# Patient Record
Sex: Female | Born: 1960 | Race: White | Hispanic: No | Marital: Married | Smoking: Former smoker
Health system: Southern US, Community
[De-identification: ages and names within clinical notes are randomized; demographics above are authoritative.]

## PROBLEM LIST (undated history)

## (undated) DIAGNOSIS — E559 Vitamin D deficiency, unspecified: Secondary | ICD-10-CM

## (undated) DIAGNOSIS — L309 Dermatitis, unspecified: Secondary | ICD-10-CM

## (undated) DIAGNOSIS — I8390 Asymptomatic varicose veins of unspecified lower extremity: Secondary | ICD-10-CM

## (undated) DIAGNOSIS — N200 Calculus of kidney: Secondary | ICD-10-CM

## (undated) DIAGNOSIS — I10 Essential (primary) hypertension: Secondary | ICD-10-CM

## (undated) DIAGNOSIS — R3915 Urgency of urination: Secondary | ICD-10-CM

## (undated) DIAGNOSIS — M3500 Sicca syndrome, unspecified: Secondary | ICD-10-CM

## (undated) DIAGNOSIS — T4145XA Adverse effect of unspecified anesthetic, initial encounter: Secondary | ICD-10-CM

## (undated) DIAGNOSIS — Z8639 Personal history of other endocrine, nutritional and metabolic disease: Secondary | ICD-10-CM

## (undated) DIAGNOSIS — R350 Frequency of micturition: Secondary | ICD-10-CM

## (undated) DIAGNOSIS — K5730 Diverticulosis of large intestine without perforation or abscess without bleeding: Secondary | ICD-10-CM

## (undated) DIAGNOSIS — T8859XA Other complications of anesthesia, initial encounter: Secondary | ICD-10-CM

## (undated) DIAGNOSIS — R7303 Prediabetes: Secondary | ICD-10-CM

## (undated) DIAGNOSIS — G4730 Sleep apnea, unspecified: Secondary | ICD-10-CM

## (undated) DIAGNOSIS — E782 Mixed hyperlipidemia: Secondary | ICD-10-CM

## (undated) DIAGNOSIS — Z87442 Personal history of urinary calculi: Secondary | ICD-10-CM

## (undated) HISTORY — PX: FOOT SURGERY: SHX648

## (undated) HISTORY — DX: Essential (primary) hypertension: I10

## (undated) HISTORY — DX: Sleep apnea, unspecified: G47.30

## (undated) HISTORY — PX: OTHER SURGICAL HISTORY: SHX169

## (undated) HISTORY — PX: COLONOSCOPY: SHX174

---

## 1998-04-14 ENCOUNTER — Other Ambulatory Visit: Admission: RE | Admit: 1998-04-14 | Discharge: 1998-04-14 | Payer: Self-pay | Admitting: Gynecology

## 1999-04-13 ENCOUNTER — Other Ambulatory Visit: Admission: RE | Admit: 1999-04-13 | Discharge: 1999-04-13 | Payer: Self-pay | Admitting: Gynecology

## 2000-04-17 ENCOUNTER — Other Ambulatory Visit: Admission: RE | Admit: 2000-04-17 | Discharge: 2000-04-17 | Payer: Self-pay | Admitting: Gynecology

## 2001-04-17 ENCOUNTER — Other Ambulatory Visit: Admission: RE | Admit: 2001-04-17 | Discharge: 2001-04-17 | Payer: Self-pay | Admitting: Gynecology

## 2002-04-19 ENCOUNTER — Other Ambulatory Visit: Admission: RE | Admit: 2002-04-19 | Discharge: 2002-04-19 | Payer: Self-pay | Admitting: Gynecology

## 2002-10-18 ENCOUNTER — Other Ambulatory Visit: Admission: RE | Admit: 2002-10-18 | Discharge: 2002-10-18 | Payer: Self-pay | Admitting: Gynecology

## 2003-02-11 ENCOUNTER — Other Ambulatory Visit: Admission: RE | Admit: 2003-02-11 | Discharge: 2003-02-11 | Payer: Self-pay | Admitting: Gynecology

## 2003-05-16 ENCOUNTER — Other Ambulatory Visit: Admission: RE | Admit: 2003-05-16 | Discharge: 2003-05-16 | Payer: Self-pay | Admitting: Gynecology

## 2003-09-19 ENCOUNTER — Other Ambulatory Visit: Admission: RE | Admit: 2003-09-19 | Discharge: 2003-09-19 | Payer: Self-pay | Admitting: Gynecology

## 2005-03-27 ENCOUNTER — Encounter: Admission: RE | Admit: 2005-03-27 | Discharge: 2005-03-27 | Payer: Self-pay | Admitting: Internal Medicine

## 2007-01-07 ENCOUNTER — Ambulatory Visit: Payer: Self-pay | Admitting: Gastroenterology

## 2007-01-07 LAB — CONVERTED CEMR LAB
Basophils Absolute: 0 10*3/uL (ref 0.0–0.1)
Eosinophils Absolute: 0.1 10*3/uL (ref 0.0–0.6)
Ferritin: 162.5 ng/mL (ref 10.0–291.0)
HCT: 39.7 % (ref 36.0–46.0)
Hemoglobin: 13.6 g/dL (ref 12.0–15.0)
Iron: 61 ug/dL (ref 42–145)
Lymphocytes Relative: 36.2 % (ref 12.0–46.0)
Monocytes Relative: 12 % — ABNORMAL HIGH (ref 3.0–11.0)
Platelets: 203 10*3/uL (ref 150–400)
RDW: 11.4 % — ABNORMAL LOW (ref 11.5–14.6)
Saturation Ratios: 18.7 % — ABNORMAL LOW (ref 20.0–50.0)
Transferrin: 233.2 mg/dL (ref >=212.0)

## 2007-02-17 ENCOUNTER — Ambulatory Visit: Payer: Self-pay | Admitting: Gastroenterology

## 2007-02-17 LAB — HM COLONOSCOPY

## 2007-04-14 ENCOUNTER — Ambulatory Visit: Payer: Self-pay | Admitting: Gastroenterology

## 2007-09-04 DIAGNOSIS — Z8719 Personal history of other diseases of the digestive system: Secondary | ICD-10-CM

## 2007-09-04 DIAGNOSIS — E039 Hypothyroidism, unspecified: Secondary | ICD-10-CM

## 2007-09-04 DIAGNOSIS — K644 Residual hemorrhoidal skin tags: Secondary | ICD-10-CM

## 2007-09-04 DIAGNOSIS — K648 Other hemorrhoids: Secondary | ICD-10-CM | POA: Insufficient documentation

## 2010-11-13 NOTE — Assessment & Plan Note (Signed)
[facility]                         GASTROENTEROLOGY OFFICE NOTE   Tiffany Glover, Tiffany Glover                       MRN:          [id number]  DATE:04/14/2007                            DOB:          04-29-1961    PRIMARY CARE PHYSICIAN:  Tiffany Glover, M.D.   GI PROBLEM LIST:  Chronic alternating constipation and loose stools.  Status post colonoscopy in August of 2008.  Normal examination to the  terminal ileum other than left-sided diverticulosis.  Small internal and  external hemorrhoids.   INTERVAL HISTORY:  I last saw the patient at the time of her  colonoscopy.  At that time it seemed that fiber supplements were not  really helping her and so she began on MiraLax that she has been taking  2-4 times a week on a rather p.r.n. basis.  She has continued to take a  lot of fiber in her diet.  She does take AnaMantle for anal itching and  says that definitely helps.  She probably takes that once or twice a  week.  She still has to strain to move her bowels at least several times  a week and has rather scybalous small stools.   CURRENT MEDICATIONS:  1. Repliva.  2. Armor thyroid.  3. Multivitamin.  4. Vitamin D.  5. Magnesium.   PHYSICAL EXAMINATION:  VITAL SIGNS:  Weight 220 pounds which is up 9  pounds since her last visit three months ago, blood pressure 104/70,  pulse 64.  CONSTITUTIONAL:  Obese, otherwise well appearing.  NEUROLOGY:  Alert and oriented x3.  ABDOMEN:  Soft, nontender, and nondistended.  Normal bowel sounds.   ASSESSMENT:  A 50 year old woman with intermittent constipation and  loose stools, likely functional.  MiraLax is usually best taken on a  daily basis rather than p.r.n. and so she will begin taking one scoop  everyday for the next 10 days.  If she does not notice much improvement  at that point she will double this.  She will return to see me in two  months or sooner if needed.     Tiffany Fee, MD  Electronically Signed    DPJ/MedQ  DD: 04/14/2007  DT: 04/15/2007  Job #: [id number]   cc:   Tiffany Glover

## 2010-11-13 NOTE — Assessment & Plan Note (Signed)
[facility]                         GASTROENTEROLOGY OFFICE NOTE   Tiffany Glover, Tiffany Glover                       MRN:          [id number]  DATE:01/07/2007                            DOB:          [birth year]    REFERRING PHYSICIAN:  Allena Napoleon   REASON FOR REFERRAL:  Dr. Alessandra Bevels asked me to evaluate Tiffany Glover in  consultation, regarding heme-positive stool, chronic constipation, left  lower quadrant pain.   HISTORY OF PRESENT ILLNESS:  Tiffany Glover is a very pleasant 50 year old  woman, who has had intermittent constipation and loose stools for many  years.  She says usually, if she is very constipated, she will drink a  lot of extra water and it does seem to help.  She has never tried fiber  supplementations.  She does see intermittent bright red blood per  rectum.  Usually, this is around the time of a lot of straining to move  her bowels with constipation.  She was recently heme-positive on two out  of four samples by Dr. Alessandra Bevels.  She was told she had low iron levels and  was put on Repliva approximately one month ago.  Since then, her stools  have been black and she thinks her constipation is a bit worse.  We do  not have the iron testing results here.  Lastly, she has intermittent  left lower quadrant pains.  These last for one to two days.  This  happens approximately once a month and has been going on for at least a  year.  At first, she thought this was gynecologic, but she has been  evaluated for this by a gynecologist, and they do not believe her pains  are gynecologic-related.  She is not clear on whether moving her bowels  will help these pains.   REVIEW OF SYSTEMS:  Notable for recent dark stools since beginning iron  supplements and a 37-pound intentional weight-loss in six months.  The  rest of her review of systems is essentially normal and is available on  her nursing intake sheet.   PAST MEDICAL HISTORY:  Elevated cholesterol,  hypothyroid, seasonal  allergies.   CURRENT MEDICINES:  1. Repliva.  2. Thyroid medicine.  3. Multivitamin.  4. Vitamin D.  5. Magnesium.   ALLERGIES:  No known drug allergies.   SOCIAL HISTORY:  Married with two sons.  Works as a Clinical cytogeneticist, nonsmoker, rarely drinks alcohol.   FAMILY HISTORY:  Alcoholism runs in her family.  No colon cancer or  colon polyps in her family.   PHYSICAL EXAM:  She is 5 feet 4 inches, 211 pounds.  Blood pressure  104/78, pulse 80.  CONSTITUTIONAL:  Generally well-appearing.  NEUROLOGIC:  Alert and oriented times three.  EYES:  Extraocular movements intact.  MOUTH:  Oropharynx moist, no lesions.  NECK:  Supple, no lymphadenopathy.  CARDIOVASCULAR/HEART:  Regular rate and rhythm.  LUNGS:  Clear to auscultation bilaterally.  ABDOMEN:  Soft, nontender, nondistended.  Normal bowel sounds.  EXTREMITIES:  No lower extremity edema.  SKIN:  No rashes or lesions on visible extremities.  ASSESSMENT AND PLAN:  Patient is a 50 year old woman with heme-positive  stool, mild intermittent constipation, intermittent bright red blood per  rectum.   First, she does have intermittent bright red blood per rectum and heme-  positive stool.  This seems like very minor bleeding.  I doubt she is  anemic, as she does not appear to be so clinically, but I will arrange  for her to have a CBC done today.  For her constipation, I have great  success with basic fiber supplementation.  I have recommended she begin  Citrucel, tapering slowly up to at least one scoop a day to see if that  helps.  That may also help her chronic intermittent left lower quadrant  pains, if they are related to her constipation.  We will arrange for her  to have a colonoscopy performed in three to four weeks.  I would like to  have her be on fiber supplements for a bit, so I can get an update on  her symptoms at the time of her colonoscopy.     Rachael Fee, MD   Electronically Signed    DPJ/MedQ  DD: 01/07/2007  DT: 01/07/2007  Job #: [id number]   cc:   Allena Napoleon

## 2011-12-09 ENCOUNTER — Ambulatory Visit (INDEPENDENT_AMBULATORY_CARE_PROVIDER_SITE_OTHER): Payer: BC Managed Care – PPO | Admitting: Emergency Medicine

## 2011-12-09 VITALS — BP 127/76 | HR 69 | Temp 97.7°F | Resp 16 | Ht 64.0 in | Wt 260.6 lb

## 2011-12-09 DIAGNOSIS — Z111 Encounter for screening for respiratory tuberculosis: Secondary | ICD-10-CM

## 2011-12-09 NOTE — Progress Notes (Signed)
  Subjective:    Patient ID: Tiffany Glover, female    DOB: September 05, 1960, 51 y.o.   MRN: [id number]  HPI patient is to have a PPD. 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    Review of Systems     Objective:   Physical Exam HEENT exam is unremarkable. Neck is supple. Chest is clear.        Assessment & Plan:  Patient to receive a PPD for work

## 2011-12-11 ENCOUNTER — Encounter (INDEPENDENT_AMBULATORY_CARE_PROVIDER_SITE_OTHER): Payer: BC Managed Care – PPO

## 2011-12-11 DIAGNOSIS — Z111 Encounter for screening for respiratory tuberculosis: Secondary | ICD-10-CM

## 2011-12-11 LAB — TB SKIN TEST: TB Skin Test: NEGATIVE

## 2012-02-21 ENCOUNTER — Emergency Department (HOSPITAL_COMMUNITY): Payer: BC Managed Care – PPO

## 2012-02-21 ENCOUNTER — Encounter (HOSPITAL_COMMUNITY): Payer: Self-pay | Admitting: Emergency Medicine

## 2012-02-21 ENCOUNTER — Emergency Department (HOSPITAL_COMMUNITY)
Admission: EM | Admit: 2012-02-21 | Discharge: 2012-02-21 | Disposition: A | Discharge status: Home Health | Payer: BC Managed Care – PPO | Attending: Emergency Medicine | Admitting: Emergency Medicine

## 2012-02-21 DIAGNOSIS — N201 Calculus of ureter: Secondary | ICD-10-CM

## 2012-02-21 DIAGNOSIS — R197 Diarrhea, unspecified: Secondary | ICD-10-CM | POA: Insufficient documentation

## 2012-02-21 DIAGNOSIS — R1013 Epigastric pain: Secondary | ICD-10-CM | POA: Insufficient documentation

## 2012-02-21 LAB — COMPREHENSIVE METABOLIC PANEL
AST: 15 U/L (ref 0–37)
Albumin: 3.9 g/dL (ref 3.5–5.2)
Chloride: 105 mEq/L (ref 96–112)
Creatinine, Ser: 0.71 mg/dL (ref 0.50–1.10)
GFR calc Af Amer: >90 mL/min (ref >90)
GFR calc non Af Amer: >90 mL/min (ref >90)
Sodium: 141 mEq/L (ref 135–145)
Total Protein: 7.8 g/dL (ref 6.0–8.3)

## 2012-02-21 LAB — URINALYSIS, ROUTINE W REFLEX MICROSCOPIC
Hgb urine dipstick: NEGATIVE
Ketones, ur: NEGATIVE mg/dL
Nitrite: NEGATIVE
Protein, ur: 30 mg/dL — AB
Urobilinogen, UA: 0.2 mg/dL (ref 0.0–1.0)
pH: 5.5 (ref 5.0–8.0)

## 2012-02-21 LAB — CBC WITH DIFFERENTIAL/PLATELET
Basophils Absolute: 0 10*3/uL (ref 0.0–0.1)
Eosinophils Relative: 3 % (ref 0–5)
Hemoglobin: 14.3 g/dL (ref 12.0–15.0)
Lymphocytes Relative: 33 % (ref 12–46)
MCV: 86.1 fL (ref 78.0–100.0)
Monocytes Absolute: 0.6 10*3/uL (ref 0.1–1.0)
Monocytes Relative: 9 % (ref 3–12)
RBC: 4.83 MIL/uL (ref 3.87–5.11)
WBC: 6.5 10*3/uL (ref 4.0–10.5)

## 2012-02-21 LAB — URINE MICROSCOPIC-ADD ON

## 2012-02-21 LAB — PREGNANCY, URINE: Preg Test, Ur: NEGATIVE

## 2012-02-21 LAB — LIPASE, BLOOD: Lipase: 41 U/L (ref 11–59)

## 2012-02-21 MED ORDER — ONDANSETRON HCL 4 MG/2ML IJ SOLN
INTRAMUSCULAR | Status: AC
  Filled 2012-02-21: qty 2

## 2012-02-21 MED ORDER — OXYCODONE-ACETAMINOPHEN 5-325 MG PO TABS: 1.0000 | ORAL_TABLET | ORAL | Status: AC | PRN

## 2012-02-21 MED ORDER — TAMSULOSIN HCL 0.4 MG PO CAPS: ORAL_CAPSULE | ORAL | Status: DC

## 2012-02-21 MED ORDER — HYDROMORPHONE HCL PF 2 MG/ML IJ SOLN
2.0000 mg | Freq: Once | INTRAMUSCULAR | Status: AC
  Administered 2012-02-21: 2 mg via INTRAVENOUS
  Filled 2012-02-21: qty 1

## 2012-02-21 MED ORDER — HYDROMORPHONE HCL PF 2 MG/ML IJ SOLN
2.0000 mg | Freq: Once | INTRAMUSCULAR | Status: AC
  Administered 2012-02-21: 2 mg via INTRAVENOUS

## 2012-02-21 MED ORDER — HYDROMORPHONE HCL PF 2 MG/ML IJ SOLN
INTRAMUSCULAR | Status: AC
  Filled 2012-02-21: qty 1

## 2012-02-21 MED ORDER — OXYCODONE-ACETAMINOPHEN 5-325 MG PO TABS
1.0000 | ORAL_TABLET | Freq: Once | ORAL | Status: AC
  Administered 2012-02-21: 1 via ORAL
  Filled 2012-02-21: qty 1

## 2012-02-21 MED ORDER — ONDANSETRON HCL 4 MG/2ML IJ SOLN
4.0000 mg | Freq: Once | INTRAMUSCULAR | Status: AC
  Administered 2012-02-21: 4 mg via INTRAVENOUS

## 2012-02-21 NOTE — ED Notes (Addendum)
Patient complaining of right flank pain/side pain that started this morning.  Patient denies any changes in urine sample.  Patient reports nausea; denies vomiting -- patient reports dry heaving.  Denies diarrhea.  LMP end of July.

## 2012-02-21 NOTE — ED Provider Notes (Signed)
History     CSN: [id number]  Arrival date & time 02/21/12  0703   First MD Initiated Contact with Patient 02/21/12 [id number]      Chief Complaint  Patient presents with  . Flank Pain    (Consider location/radiation/quality/duration/timing/severity/associated sxs/prior treatment) HPI Comments: The patient is a 51 year old man who pain or right flank fibrillation is starting. The pain is so bad she has to stand and painful. She's had problems with epigastric pain in the past and takes Prilosec for that. She has a son who has had kidney stones. Review of prior charts shows a history of constipation. In the past she's been on thyroid medication. She's had 2 prior C-sections.  Patient is a 51 y.o. female presenting with flank pain. The history is provided by the patient and medical records. No language interpreter was used.  Flank Pain This is a new problem. The current episode started 1 to 2 hours ago. The problem occurs constantly. The problem has not changed since onset.Associated symptoms include abdominal pain. Pertinent negatives include no chest pain, no headaches and no shortness of breath. Nothing aggravates the symptoms. Nothing relieves the symptoms. She has tried nothing for the symptoms.    History reviewed. No pertinent past medical history.  Past Surgical History  Procedure Date  . Cesarean section     History reviewed. No pertinent family history.  History  Substance Use Topics  . Smoking status: Never Smoker   . Smokeless tobacco: Not on file  . Alcohol Use: Yes     Occassional Use    OB History    Grav Para Term Preterm Abortions TAB SAB Ect Mult Living                  Review of Systems  Constitutional: Negative for fever and chills.  HENT: Negative.   Eyes: Negative.   Respiratory: Negative.  Negative for shortness of breath.   Cardiovascular: Negative.  Negative for chest pain.  Gastrointestinal: Positive for abdominal pain and diarrhea. Negative for  nausea and vomiting.  Genitourinary: Positive for flank pain.  Musculoskeletal: Negative.   Skin: Negative.   Neurological: Negative.  Negative for headaches.  Psychiatric/Behavioral: Negative.     Allergies  Review of patient's allergies indicates no known allergies.  Home Medications   Current Outpatient Rx  Name Route Sig Dispense Refill  . ONE-DAILY MULTI VITAMINS PO TABS Oral Take 1 tablet by mouth daily.      BP 201/94  Pulse 89  Temp 98.6 F (37 C) (Oral)  Resp 20  SpO2 99%  LMP 01/29/2012  Physical Exam  Nursing note and vitals reviewed. Constitutional: She is oriented to person, place, and time.       Morbidly obese woman in acute distress with right flank pain.  HENT:  Head: Normocephalic and atraumatic.  Right Ear: External ear normal.  Left Ear: External ear normal.  Mouth/Throat: Oropharynx is clear and moist.  Eyes: Conjunctivae and EOM are normal. Pupils are equal, round, and reactive to light.  Neck: Neck supple.  Cardiovascular: Normal rate, regular rhythm and normal heart sounds.   Pulmonary/Chest: Effort normal and breath sounds normal.  Abdominal: Soft. She exhibits no distension and no mass. There is no tenderness.       She localizes pain to the right costovertebral angle region and into the right flank. There is no palpable deformity or point tenderness over this area.  Musculoskeletal: Normal range of motion.  Neurological: She is alert and oriented  to person, place, and time.       No sensory or motor deficit.  Skin: Skin is warm and dry.  Psychiatric: She has a normal mood and affect. Her behavior is normal.    ED Course  Procedures (including critical care time)   Labs Reviewed  CBC WITH DIFFERENTIAL  COMPREHENSIVE METABOLIC PANEL  URINALYSIS, ROUTINE W REFLEX MICROSCOPIC  LIPASE, BLOOD  PREGNANCY, URINE   7:26 AM Pt seen --> physical exam performed.  Lab workup ordered.  IV Dilaudid and Zofran ordered.  9:19 AM Results for  orders placed during the hospital encounter of 02/21/12  CBC WITH DIFFERENTIAL      Component Value Range   WBC 6.5  4.0 - 10.5 K/uL   RBC 4.83  3.87 - 5.11 MIL/uL   Hemoglobin 14.3  12.0 - 15.0 g/dL   HCT 16.1  09.6 - 04.5 %   MCV 86.1  78.0 - 100.0 fL   MCH 29.6  26.0 - 34.0 pg   MCHC 34.4  30.0 - 36.0 g/dL   RDW 40.9  81.1 - 91.4 %   Platelets 187  150 - 400 K/uL   Neutrophils Relative 55  43 - 77 %   Neutro Abs 3.6  1.7 - 7.7 K/uL   Lymphocytes Relative 33  12 - 46 %   Lymphs Abs 2.1  0.7 - 4.0 K/uL   Monocytes Relative 9  3 - 12 %   Monocytes Absolute 0.6  0.1 - 1.0 K/uL   Eosinophils Relative 3  0 - 5 %   Eosinophils Absolute 0.2  0.0 - 0.7 K/uL   Basophils Relative 1  0 - 1 %   Basophils Absolute 0.0  0.0 - 0.1 K/uL  COMPREHENSIVE METABOLIC PANEL      Component Value Range   Sodium 141  135 - 145 mEq/L   Potassium 4.0  3.5 - 5.1 mEq/L   Chloride 105  96 - 112 mEq/L   CO2 24  19 - 32 mEq/L   Glucose, Bld 129 (*) 70 - 99 mg/dL   BUN 16  6 - 23 mg/dL   Creatinine, Ser 7.82  0.50 - 1.10 mg/dL   Calcium 9.2  8.4 - 95.6 mg/dL   Total Protein 7.8  6.0 - 8.3 g/dL   Albumin 3.9  3.5 - 5.2 g/dL   AST 15  0 - 37 U/L   ALT 16  0 - 35 U/L   Alkaline Phosphatase 60  39 - 117 U/L   Total Bilirubin 0.4  0.3 - 1.2 mg/dL   GFR calc non Af Amer >90  >90 mL/min   GFR calc Af Amer >90  >90 mL/min  URINALYSIS, ROUTINE W REFLEX MICROSCOPIC      Component Value Range   Color, Urine YELLOW  YELLOW   APPearance HAZY (*) CLEAR   Specific Gravity, Urine 1.028  1.005 - 1.030   pH 5.5  5.0 - 8.0   Glucose, UA NEGATIVE  NEGATIVE mg/dL   Hgb urine dipstick NEGATIVE  NEGATIVE   Bilirubin Urine NEGATIVE  NEGATIVE   Ketones, ur NEGATIVE  NEGATIVE mg/dL   Protein, ur 30 (*) NEGATIVE mg/dL   Urobilinogen, UA 0.2  0.0 - 1.0 mg/dL   Nitrite NEGATIVE  NEGATIVE   Leukocytes, UA NEGATIVE  NEGATIVE  LIPASE, BLOOD      Component Value Range   Lipase 41  11 - 59 U/L  PREGNANCY, URINE       Component Value  Range   Preg Test, Ur NEGATIVE  NEGATIVE  POCT PREGNANCY, URINE      Component Value Range   Preg Test, Ur NEGATIVE  NEGATIVE  URINE MICROSCOPIC-ADD ON      Component Value Range   Squamous Epithelial / LPF MANY (*) RARE   WBC, UA 3-6  <3 WBC/hpf   RBC / HPF 0-2  <3 RBC/hpf   Bacteria, UA MANY (*) RARE   Ct Abdomen Pelvis Wo Contrast  02/21/2012  *RADIOLOGY REPORT*  Clinical Data: Right flank and epigastric pain with nausea.  CT ABDOMEN AND PELVIS WITHOUT CONTRAST  Technique:  Multidetector CT imaging of the abdomen and pelvis was performed following the standard protocol without intravenous contrast.  Comparison: Pelvic ultrasound 03/27/2005.  Findings: The lung bases are clear.  There is no pleural effusion. No renal calculi are demonstrated.  There is mild right-sided hydronephrosis and hydroureter.  There is a 3 mm obstructing calculus at the right ureteral vesicle junction on image 76.  This is questionably visible on the patient's scout image.  No other urinary tract calculi are seen.  The kidneys otherwise appear normal.  The liver demonstrates diffuse low density consistent with steatosis.  There is relative sparing surrounding the gallbladder. No focal lesions are identified.  As evaluated in the noncontrast state, the spleen, adrenal glands, pancreas and gallbladder appear normal.  There is a small supraumbilical hernia containing only fat.  The bowel gas pattern is normal.  No inflammatory changes or enlarged lymph nodes are seen. The uterus and ovaries appear unremarkable.  There is early aorto iliac atherosclerosis.  There is moderately advanced degenerative disc disease at L5-S1 with disc space loss and endplate sclerosis.  This results in mild biforaminal stenosis.  IMPRESSION:  1.  Obstructing 3 mm calculus at the right ureteral vesicle junction. 2.  No other urinary tract calculi. 3.  Hepatic steatosis, lumbar spondylosis and atherosclerosis as described.   Original  Report Authenticated By: Gerrianne Scale, M.D.     CT of abdomen/pelvis shows a 3 mm right ureteral stone at the right ureterovesical junction.  I discussed her findings with her and with her husband.  Will observe for an hour to be sure her pain is under control prior to discharge.   10:26 AM Pt resting comfortably.  Will discharge, with followup with Dr. Retta Diones, urologist on call.    1. Right ureteral calculus          Carleene Cooper III, MD 02/21/12 1029

## 2012-02-21 NOTE — ED Notes (Signed)
Family at bedside. 

## 2012-06-16 ENCOUNTER — Ambulatory Visit: Payer: BC Managed Care – PPO

## 2012-06-16 ENCOUNTER — Ambulatory Visit (INDEPENDENT_AMBULATORY_CARE_PROVIDER_SITE_OTHER): Payer: BC Managed Care – PPO | Admitting: Family Medicine

## 2012-06-16 VITALS — BP 140/90 | HR 82 | Temp 97.9°F | Resp 16 | Ht 64.0 in | Wt 262.0 lb

## 2012-06-16 DIAGNOSIS — H9209 Otalgia, unspecified ear: Secondary | ICD-10-CM

## 2012-06-16 DIAGNOSIS — R059 Cough, unspecified: Secondary | ICD-10-CM

## 2012-06-16 DIAGNOSIS — R0989 Other specified symptoms and signs involving the circulatory and respiratory systems: Secondary | ICD-10-CM

## 2012-06-16 DIAGNOSIS — R05 Cough: Secondary | ICD-10-CM

## 2012-06-16 DIAGNOSIS — R0982 Postnasal drip: Secondary | ICD-10-CM

## 2012-06-16 MED ORDER — IPRATROPIUM BROMIDE 0.03 % NA SOLN: 2.0000 | Freq: Four times a day (QID) | NASAL | Status: DC

## 2012-06-16 MED ORDER — CEFDINIR 300 MG PO CAPS: 300.0000 mg | ORAL_CAPSULE | Freq: Two times a day (BID) | ORAL | Status: DC

## 2012-06-16 MED ORDER — HYDROCODONE-HOMATROPINE 5-1.5 MG/5ML PO SYRP: 5.0000 mL | ORAL_SOLUTION | Freq: Three times a day (TID) | ORAL | Status: DC | PRN

## 2012-06-16 NOTE — Patient Instructions (Addendum)
Use the antiobiotic as directed, and the cough syrup as needed.  Be careful as the syrup can make you sleepy.  You can also use the nasal spray for drainage.  Let me know if you are not better in the next 2 or 3 days- Sooner if worse.

## 2012-06-16 NOTE — Progress Notes (Signed)
[facility] [address], [place] [address] [phone]  Date:  06/16/2012   Name:  Tiffany Glover   DOB:  Oct 07, 1960   MRN:  [id number]  PCP:  Pcp Not In System    Chief Complaint: Nasal Congestion, Cough, Laryngitis and Otalgia   History of Present Illness:  Tiffany Glover is a 51 y.o. very pleasant female patient who presents with the following: Here today with illness.  She was ill with sinus and left ear congestion about 3 weeks ago- treated with OTC medications and got better, but then her symptoms returned.  She now has sinus congestion/ pressure/ PND and a cough She has noted a cough for the last week.  The cough can be productive No fever, no chills or aches She has felt a bit short winded for the last couple of days, no CP.  This is not severe but she wanted to mention it.    Patient Active Problem List  Diagnosis  . HYPOTHYROIDISM  . INTERNAL HEMORRHOIDS  . EXTERNAL HEMORRHOIDS  . DIVERTICULOSIS OF COLON    History reviewed. No pertinent past medical history.  Past Surgical History  Procedure Date  . Cesarean section   . Cesarean section     History  Substance Use Topics  . Smoking status: Never Smoker   . Smokeless tobacco: Not on file  . Alcohol Use: Yes     Comment: Occassional Use    Family History  Problem Relation Age of Onset  . Urolithiasis Son     No Known Allergies  Medication list has been reviewed and updated.  Current Outpatient Prescriptions on File Prior to Visit  Medication Sig Dispense Refill  . Multiple Vitamin (MULTIVITAMIN) tablet Take 1 tablet by mouth daily.      Marland Kitchen omeprazole (PRILOSEC) 40 MG capsule Take 40 mg by mouth daily.      . Tamsulosin HCl (FLOMAX) 0.4 MG CAPS Take one capsule per day to facilitate stone passage.  5 capsule  0    Review of Systems: As per HPI- otherwise negative.   Physical Examination: Filed Vitals:   06/16/12 0912  BP: 140/90  Pulse: 82  Temp: 97.9 F (36.6 C)   Resp: 16   Filed Vitals:   06/16/12 0912  Height: 5\' 4"  (1.626 m)  Weight: 262 lb (118.842 kg)   Body mass index is 44.97 kg/(m^2). Ideal Body Weight: Weight in (lb) to have BMI = 25: 145.3   GEN: WDWN, NAD, Non-toxic, A & O x 3, obese HEENT: Atraumatic, Normocephalic. Neck supple. No masses, No LAD. Bilateral TM wnl, oropharynx normal.  PEERL,EOMI.   Ears and Nose: No external deformity. CV: RRR, No M/G/R. No JVD. No thrill. No extra heart sounds. PULM: CTA B, no wheezes or rhonchi. No retractions. No resp. distress. No accessory muscle use. Slight crackles right base ABD: S, NT, ND EXTR: No c/c/e NEURO Normal gait.  PSYCH: Normally interactive. Conversant. Not depressed or anxious appearing.  Calm demeanor.   [facility] reading (PRIMARY) by  Dr. Patsy Lager. CXR: degenerative change in the T spine.  Bronchitis changes  CHEST - 2 VIEW  Comparison: None.  Findings: There is minimal scarring in the right mid lung. Lungs otherwise clear. Heart size and pulmonary vascularity are normal. No adenopathy. There is degenerative change in the mid thoracic spine.  IMPRESSION: No edema or consolidation.  Assessment and Plan: 1. Chest congestion  DG Chest 2 View  2. Cough  cefdinir (OMNICEF)  300 MG capsule, HYDROcodone-homatropine (HYCODAN) 5-1.5 MG/5ML syrup  3. Ear pain    4. PND (post-nasal drip)  ipratropium (ATROVENT) 0.03 % nasal spray   Treat as above for bronchitis.  Patient (or parent if minor) instructed to return to clinic or call if not better in 2-3 day(s).   Abbe Amsterdam, MD

## 2012-07-03 ENCOUNTER — Ambulatory Visit (INDEPENDENT_AMBULATORY_CARE_PROVIDER_SITE_OTHER): Payer: BC Managed Care – PPO | Admitting: Family Medicine

## 2012-07-03 ENCOUNTER — Ambulatory Visit: Payer: BC Managed Care – PPO

## 2012-07-03 VITALS — BP 136/83 | HR 76 | Temp 97.8°F | Resp 16 | Ht 64.0 in | Wt 271.0 lb

## 2012-07-03 DIAGNOSIS — H6590 Unspecified nonsuppurative otitis media, unspecified ear: Secondary | ICD-10-CM

## 2012-07-03 DIAGNOSIS — J069 Acute upper respiratory infection, unspecified: Secondary | ICD-10-CM

## 2012-07-03 DIAGNOSIS — R05 Cough: Secondary | ICD-10-CM

## 2012-07-03 DIAGNOSIS — J3489 Other specified disorders of nose and nasal sinuses: Secondary | ICD-10-CM

## 2012-07-03 MED ORDER — HYDROCOD POLST-CHLORPHEN POLST 10-8 MG/5ML PO LQCR: 5.0000 mL | Freq: Two times a day (BID) | ORAL | Status: DC | PRN

## 2012-07-03 MED ORDER — FLUTICASONE PROPIONATE 50 MCG/ACT NA SUSP: 2.0000 | Freq: Every day | NASAL | Status: DC

## 2012-07-03 MED ORDER — PREDNISONE 20 MG PO TABS: ORAL_TABLET | ORAL | Status: DC

## 2012-07-03 NOTE — Progress Notes (Signed)
Patient ID: Tiffany Glover MRN: [id number], DOB: [birth year], 52 y.o. Date of Encounter: 07/03/2012, 5:58 PM  Primary Physician: Pcp Not In System  Chief Complaint:  Chief Complaint  Patient presents with  . Otalgia    left ear still painful  . Sinusitis    finished antibiotics, feels like illness coming back    HPI: 52 y.o. year old female presents with a 4 day history of nasal congestion, post nasal drip, left sided sinus pressure, and cough. Patient initially seen on 06/16/12 and diagnosed with bronchitis. She had a chest xray that revealed minimal scarring in the right mid lung otherwise no edema or consolidation. She was treated with Omnicef, Atrovent nasal spray, and Hycodan. At that time she had been off and on sick for about 3 weeks. With her treatment on 06/16/12 she did feel considerably better, but she is not sure that her symptoms fully resolved.   She now comes in with the above. Afebrile. No chills. She complains of increasing pain along the left maxillary and frontal sinus when she leans forward. Left otalgia and muffled hearing along the left ear. At baseline she has equal hearing bilaterally. She does feel like that is considerable post nasal drip that she will have to cough to clear her throat every so often. She does not feel like she has any chest congestion at this time. No wheezing, shortness of breath, or chest pain. No sore throat currently. No right sided symptoms. She was a prior tobacco smoker. Quit smoking in 1996. Smoked for 14 years and got up to 1.5 packs per day. There have been several sick contacts at work with similar issues.    History reviewed. No pertinent past medical history.   Home Meds: Prior to Admission medications   Medication Sig Start Date End Date Taking? Authorizing Provider  Multiple Vitamin (MULTIVITAMIN) tablet Take 1 tablet by mouth daily.   Yes Historical Provider, MD  omeprazole (PRILOSEC) 40 MG capsule Take 40 mg by mouth daily.    Yes Historical Provider, MD  ipratropium (ATROVENT) 0.03 % nasal spray Place 2 sprays into the nose 4 (four) times daily. 06/16/12  No Gwenlyn Found Copland, MD  Tamsulosin HCl (FLOMAX) 0.4 MG CAPS Take one capsule per day to facilitate stone passage. 02/21/12  No Carleene Cooper III, MD    Allergies: No Known Allergies  History   Social History  . Marital Status: Married    Spouse Name: N/A    Number of Children: N/A  . Years of Education: N/A   Occupational History  . Not on file.   Social History Main Topics  . Smoking status: Never Smoker   . Smokeless tobacco: Not on file  . Alcohol Use: Yes     Comment: Occassional Use  . Drug Use: No  . Sexually Active:    Other Topics Concern  . Not on file   Social History Narrative  . No narrative on file     Review of Systems: Constitutional: positive for fatigue. negative for chills, fever, night sweats or weight changes HEENT: see above Cardiovascular: negative for chest pain or palpitations Respiratory: positive for coughnegative for hemoptysis, wheezing, or shortness of breath Abdominal: negative for abdominal pain, nausea, vomiting or diarrhea Dermatological: negative for rash Neurologic: negative for headache   Physical Exam: Blood pressure 136/83, pulse 76, temperature 97.8 F (36.6 C), temperature source Oral, resp. rate 16, height 5\' 4"  (1.626 m), weight 271 lb (122.925 kg), last menstrual period 05/28/2012,  SpO2 99.00%., Body mass index is 46.52 kg/(m^2). General: Well developed, well nourished, in no acute distress. Head: Normocephalic, atraumatic, eyes without discharge, sclera non-icteric, nares are congested. Bilateral auditory canals clear, TM's are without perforation, pearly grey with reflective cone of light bilaterally. Serous effusion behind left TM. Left sided pinna TTP. No mastoid TTP bilaterally. Left sided maxillary and frontal sinus TTP. Oral cavity moist, dentition normal. Posterior pharynx with post nasal  drip and mild erythema. No peritonsillar abscess or tonsillar exudate. Uvula midline.  Neck: Supple. No thyromegaly. Full ROM. No lymphadenopathy. Lungs: Clear bilaterally to auscultation without wheezes, rales, or rhonchi. Breathing is unlabored.  Heart: RRR with S1 S2. No murmurs, rubs, or gallops appreciated. Msk:  Strength and tone normal for age. Extremities: No clubbing or cyanosis. No edema. Neuro: Alert and oriented X 3. Moves all extremities spontaneously. CNII-XII grossly in tact. Psych:  Responds to questions appropriately with a normal affect.   Studies: Reviewed prior note and xray.   Sinus film: [facility] reading (PRIMARY) by  Dr. Katrinka Blazing. Negative  ASSESSMENT AND PLAN:  52 y.o. year old female with URI and serous otitis media. -Prednisone 20 mg #18 3x3, 2x3, 1x3 no RF -Flonase 2 sprays each nare daily #1 no RF -Tussionex 1 tsp bid prn cough #90 mL no RF -She has Atrovent nasal spray at home for use -Mucinex -Tylenol/Motrin prn -Rest/fluids -RTC precautions -RTC 3-5 days if no improvement  Elinor Dodge, PA-C 07/03/2012 5:58 PM

## 2012-07-09 NOTE — Progress Notes (Signed)
History and physical exam discussed in detail with Eula Listen, PA-C.  Sinus films reviewed and negative.  Agree with assessment and plan. KMS

## 2012-07-17 ENCOUNTER — Ambulatory Visit: Payer: BC Managed Care – PPO

## 2012-07-17 ENCOUNTER — Ambulatory Visit (INDEPENDENT_AMBULATORY_CARE_PROVIDER_SITE_OTHER): Payer: BC Managed Care – PPO | Admitting: Family Medicine

## 2012-07-17 VITALS — BP 140/84 | HR 103 | Temp 98.2°F | Resp 18 | Ht 65.0 in | Wt 260.4 lb

## 2012-07-17 DIAGNOSIS — R109 Unspecified abdominal pain: Secondary | ICD-10-CM

## 2012-07-17 DIAGNOSIS — R319 Hematuria, unspecified: Secondary | ICD-10-CM

## 2012-07-17 DIAGNOSIS — S40869A Insect bite (nonvenomous) of unspecified upper arm, initial encounter: Secondary | ICD-10-CM

## 2012-07-17 DIAGNOSIS — M549 Dorsalgia, unspecified: Secondary | ICD-10-CM

## 2012-07-17 DIAGNOSIS — S40269A Insect bite (nonvenomous) of unspecified shoulder, initial encounter: Secondary | ICD-10-CM

## 2012-07-17 DIAGNOSIS — Z87442 Personal history of urinary calculi: Secondary | ICD-10-CM

## 2012-07-17 LAB — POCT CBC
Granulocyte percent: 69.8 %G (ref 37–80)
HCT, POC: 50.3 % — AB (ref 37.7–47.9)
Hemoglobin: 15.6 g/dL (ref 12.2–16.2)
Lymph, poc: 2.1 (ref 0.6–3.4)
MCHC: 31 g/dL — AB (ref 31.8–35.4)
MPV: 10.7 fL (ref 0–99.8)
POC Granulocyte: 6.2 (ref 2–6.9)
POC MID %: 6.5 %M (ref 0–12)
RBC: 5.48 M/uL (ref 4.04–5.48)

## 2012-07-17 LAB — POCT URINALYSIS DIPSTICK
Bilirubin, UA: NEGATIVE
Glucose, UA: NEGATIVE
Ketones, UA: 40
Leukocytes, UA: NEGATIVE
Nitrite, UA: NEGATIVE
Spec Grav, UA: 1.025
Urobilinogen, UA: 0.2
pH, UA: 5.5

## 2012-07-17 LAB — POCT UA - MICROSCOPIC ONLY
Amorphous: POSITIVE
Casts, Ur, LPF, POC: NEGATIVE
Crystals, Ur, HPF, POC: NEGATIVE
Yeast, UA: NEGATIVE

## 2012-07-17 MED ORDER — DOXYCYCLINE HYCLATE 100 MG PO TABS: 100.0000 mg | ORAL_TABLET | Freq: Two times a day (BID) | ORAL | Status: DC

## 2012-07-17 MED ORDER — TAMSULOSIN HCL 0.4 MG PO CAPS: ORAL_CAPSULE | ORAL | Status: DC

## 2012-07-17 MED ORDER — HYDROCODONE-ACETAMINOPHEN 5-325 MG PO TABS: 1.0000 | ORAL_TABLET | Freq: Four times a day (QID) | ORAL | Status: DC | PRN

## 2012-07-17 NOTE — Progress Notes (Signed)
Subjective:    Patient ID: Tiffany Glover, female    DOB: [birth year], 52 y.o.   MRN: [id number]  HPI Tiffany Glover is a 52 y.o. female  Upper back pain - R flank pain started 3 days ago - sore, but different than muscle pain.  Shooting type pain when sitting.  Improved some next 2 mornings, then worse as day goes on. No fever. Sensation of urgency. No blood or burning.  Hx of nephrolithiasis in 02/13/12- passed on own. this was 1st episode, no follow up with urology. Son has had kidney stones. Slight nausea - no vomiting. Less appetite today.   Tick exposure - pulled off tick under R arm - last night.  Unknown how long was there due to location. No recent travel, no camping or working in yard. Removed with tweezers, then scraped  No fever, slight soreness in abdomen - middle aspect. Thought due to not eating much yesterday - trying to lose weight. Loose stools today.   Review of Systems  Constitutional: Negative for fever and chills.  Gastrointestinal: Positive for nausea and diarrhea.  Musculoskeletal: Positive for back pain.  Skin: Positive for rash.       Objective:   Physical Exam  Vitals reviewed. Constitutional: She is oriented to person, place, and time. She appears well-developed and well-nourished.  HENT:  Head: Normocephalic and atraumatic.  Pulmonary/Chest: Effort normal.  Abdominal: Soft. Normal appearance. She exhibits no distension. There is no tenderness. There is no rebound, no guarding and no CVA tenderness.       Nontender, nondistended, negative heel tap.   Neurological: She is alert and oriented to person, place, and time.  Skin: Skin is warm. There is erythema.     Psychiatric: She has a normal mood and affect. Her behavior is normal.   [facility] reading (PRIMARY) by  Dr. Neva Seat: 1 view abdomen  No nephrolith identified. .   Results for orders placed in visit on 07/17/12  POCT UA - MICROSCOPIC ONLY      Component Value Range   WBC, Ur, HPF, POC 0-2     RBC, urine, microscopic 0-6     Bacteria, U Microscopic trace     Mucus, UA trace     Epithelial cells, urine per micros 1-4     Crystals, Ur, HPF, POC neg     Casts, Ur, LPF, POC neg     Yeast, UA neg     Amorphous pos    POCT URINALYSIS DIPSTICK      Component Value Range   Color, UA yellow     Clarity, UA turbin     Glucose, UA neg     Bilirubin, UA neg     Ketones, UA 40     Spec Grav, UA 1.025     Blood, UA trace-lysed     pH, UA 5.5     Protein, UA trace     Urobilinogen, UA 0.2     Nitrite, UA neg     Leukocytes, UA Negative    POCT CBC      Component Value Range   WBC 8.9  4.6 - 10.2 K/uL   Lymph, poc 2.1  0.6 - 3.4   POC LYMPH PERCENT 23.7  10 - 50 %L   MID (cbc) 0.6  0 - 0.9   POC MID % 6.5  0 - 12 %M   POC Granulocyte 6.2  2 - 6.9   Granulocyte percent 69.8  37 -  80 %G   RBC 5.48  4.04 - 5.48 M/uL   Hemoglobin 15.6  12.2 - 16.2 g/dL   HCT, POC 09.8 (*) 11.9 - 47.9 %   MCV 91.8  80 - 97 fL   MCH, POC 28.5  27 - 31.2 pg   MCHC 31.0 (*) 31.8 - 35.4 g/dL   RDW, POC 14.7     Platelet Count, POC 267  142 - 424 K/uL   MPV 10.7  0 - 99.8 fL     Tick in plastic bag - approx 4mm, no head visualized,     Assessment & Plan:   Tiffany Glover is a 52 y.o. female 1. Back pain  POCT UA - Microscopic Only, POCT urinalysis dipstick, DG Abd 1 View, POCT CBC, HYDROcodone-acetaminophen (NORCO/VICODIN) 5-325 MG per tablet, Tamsulosin HCl (FLOMAX) 0.4 MG CAPS  2. Abdominal pain  DG Abd 1 View, POCT CBC  3. Hematuria  Urine culture, HYDROcodone-acetaminophen (NORCO/VICODIN) 5-325 MG per tablet, Tamsulosin HCl (FLOMAX) 0.4 MG CAPS  4. History of nephrolithiasis  HYDROcodone-acetaminophen (NORCO/VICODIN) 5-325 MG per tablet, Tamsulosin HCl (FLOMAX) 0.4 MG CAPS  5. Tick bite of upper arm  doxycycline (VIBRA-TABS) 100 MG tablet   Back, abd pain - possible recurrence of nephrolithiasis. Push fluids, lortab if needed, flomax, filter for and check urine cx. . Recheck in next 2 days  if not improving for possible CT abdome/pelvis.  Sooner if worse.  TIck bite with erythema on upper arm.  Doubt RMSF, likely tick erythema vs early cellulitis - start doxycycline 100mg  BID. rtc precautions. No head visualized in wound.   Patient Instructions  Increase fluids, flomax and lortab for pain as discussed.  Filter urine to catch stone if passed so it can be analyzed. Recheck in 2 days if not improving.  Start antibiotics for tick bite area. If increasing redness, or other worsening - return to clinic.   Return to the clinic or go to the nearest emergency room if any of your symptoms worsen or new symptoms occur.   Kidney Stones Kidney stones (ureteral lithiasis) are deposits that form inside your kidneys. The intense pain is caused by the stone moving through the urinary tract. When the stone moves, the ureter goes into spasm around the stone. The stone is usually passed in the urine.  CAUSES   A disorder that makes certain neck glands produce too much parathyroid hormone (primary hyperparathyroidism).  A buildup of uric acid crystals.  Narrowing (stricture) of the ureter.  A kidney obstruction present at birth (congenital obstruction).  Previous surgery on the kidney or ureters.  Numerous kidney infections. SYMPTOMS   Feeling sick to your stomach (nauseous).  Throwing up (vomiting).  Blood in the urine (hematuria).  Pain that usually spreads (radiates) to the groin.  Frequency or urgency of urination. DIAGNOSIS   Taking a history and physical exam.  Blood or urine tests.  Computerized X-ray scan (CT scan).  Occasionally, an examination of the inside of the urinary bladder (cystoscopy) is performed. TREATMENT   Observation.  Increasing your fluid intake.  Surgery may be needed if you have severe pain or persistent obstruction. The size, location, and chemical composition are all important variables that will determine the proper choice of action for you.  Talk to your caregiver to better understand your situation so that you will minimize the risk of injury to yourself and your kidney.  HOME CARE INSTRUCTIONS   Drink enough water and fluids to keep your urine clear or  pale yellow.  Strain all urine through the provided strainer. Keep all particulate matter and stones for your caregiver to see. The stone causing the pain may be as small as a grain of salt. It is very important to use the strainer each and every time you pass your urine. The collection of your stone will allow your caregiver to analyze it and verify that a stone has actually passed.  Only take over-the-counter or prescription medicines for pain, discomfort, or fever as directed by your caregiver.  Make a follow-up appointment with your caregiver as directed.  Get follow-up X-rays if required. The absence of pain does not always mean that the stone has passed. It may have only stopped moving. If the urine remains completely obstructed, it can cause loss of kidney function or even complete destruction of the kidney. It is your responsibility to make sure X-rays and follow-ups are completed. Ultrasounds of the kidney can show blockages and the status of the kidney. Ultrasounds are not associated with any radiation and can be performed easily in a matter of minutes. SEEK IMMEDIATE MEDICAL CARE IF:   Pain cannot be controlled with the prescribed medicine.  You have a fever.  The severity or intensity of pain increases over 18 hours and is not relieved by pain medicine.  You develop a new onset of abdominal pain.  You feel faint or pass out. MAKE SURE YOU:   Understand these instructions.  Will watch your condition.  Will get help right away if you are not doing well or get worse. Document Released: 06/17/2005 Document Revised: 09/09/2011 Document Reviewed: 10/13/2009 [facility] Patient Information 2013 [place], [place].

## 2012-07-17 NOTE — Patient Instructions (Signed)
Increase fluids, flomax and lortab for pain as discussed.  Filter urine to catch stone if passed so it can be analyzed. Recheck in 2 days if not improving.  Start antibiotics for tick bite area. If increasing redness, or other worsening - return to clinic.   Return to the clinic or go to the nearest emergency room if any of your symptoms worsen or new symptoms occur.   Kidney Stones Kidney stones (ureteral lithiasis) are deposits that form inside your kidneys. The intense pain is caused by the stone moving through the urinary tract. When the stone moves, the ureter goes into spasm around the stone. The stone is usually passed in the urine.  CAUSES   A disorder that makes certain neck glands produce too much parathyroid hormone (primary hyperparathyroidism).  A buildup of uric acid crystals.  Narrowing (stricture) of the ureter.  A kidney obstruction present at birth (congenital obstruction).  Previous surgery on the kidney or ureters.  Numerous kidney infections. SYMPTOMS   Feeling sick to your stomach (nauseous).  Throwing up (vomiting).  Blood in the urine (hematuria).  Pain that usually spreads (radiates) to the groin.  Frequency or urgency of urination. DIAGNOSIS   Taking a history and physical exam.  Blood or urine tests.  Computerized X-ray scan (CT scan).  Occasionally, an examination of the inside of the urinary bladder (cystoscopy) is performed. TREATMENT   Observation.  Increasing your fluid intake.  Surgery may be needed if you have severe pain or persistent obstruction. The size, location, and chemical composition are all important variables that will determine the proper choice of action for you. Talk to your caregiver to better understand your situation so that you will minimize the risk of injury to yourself and your kidney.  HOME CARE INSTRUCTIONS   Drink enough water and fluids to keep your urine clear or pale yellow.  Strain all urine through the  provided strainer. Keep all particulate matter and stones for your caregiver to see. The stone causing the pain may be as small as a grain of salt. It is very important to use the strainer each and every time you pass your urine. The collection of your stone will allow your caregiver to analyze it and verify that a stone has actually passed.  Only take over-the-counter or prescription medicines for pain, discomfort, or fever as directed by your caregiver.  Make a follow-up appointment with your caregiver as directed.  Get follow-up X-rays if required. The absence of pain does not always mean that the stone has passed. It may have only stopped moving. If the urine remains completely obstructed, it can cause loss of kidney function or even complete destruction of the kidney. It is your responsibility to make sure X-rays and follow-ups are completed. Ultrasounds of the kidney can show blockages and the status of the kidney. Ultrasounds are not associated with any radiation and can be performed easily in a matter of minutes. SEEK IMMEDIATE MEDICAL CARE IF:   Pain cannot be controlled with the prescribed medicine.  You have a fever.  The severity or intensity of pain increases over 18 hours and is not relieved by pain medicine.  You develop a new onset of abdominal pain.  You feel faint or pass out. MAKE SURE YOU:   Understand these instructions.  Will watch your condition.  Will get help right away if you are not doing well or get worse. Document Released: 06/17/2005 Document Revised: 09/09/2011 Document Reviewed: 10/13/2009 [employer] Patient Information 2013  [employer].

## 2012-07-19 LAB — URINE CULTURE: Colony Count: 30000

## 2012-07-20 ENCOUNTER — Inpatient Hospital Stay (HOSPITAL_COMMUNITY)
Admission: EM | Admit: 2012-07-20 | Discharge: 2012-07-25 | DRG: 198 | Disposition: A | Discharge status: Home / Self Care | Payer: BC Managed Care – PPO | Attending: General Surgery | Admitting: General Surgery

## 2012-07-20 ENCOUNTER — Emergency Department (HOSPITAL_COMMUNITY): Payer: BC Managed Care – PPO

## 2012-07-20 ENCOUNTER — Ambulatory Visit: Payer: BC Managed Care – PPO

## 2012-07-20 ENCOUNTER — Ambulatory Visit (INDEPENDENT_AMBULATORY_CARE_PROVIDER_SITE_OTHER): Payer: BC Managed Care – PPO | Admitting: Family Medicine

## 2012-07-20 ENCOUNTER — Encounter (HOSPITAL_COMMUNITY): Payer: Self-pay | Admitting: *Deleted

## 2012-07-20 VITALS — BP 138/75 | HR 108 | Temp 99.2°F | Resp 18 | Ht 65.0 in | Wt 260.6 lb

## 2012-07-20 DIAGNOSIS — E039 Hypothyroidism, unspecified: Secondary | ICD-10-CM | POA: Diagnosis present

## 2012-07-20 DIAGNOSIS — Z79899 Other long term (current) drug therapy: Secondary | ICD-10-CM

## 2012-07-20 DIAGNOSIS — K819 Cholecystitis, unspecified: Secondary | ICD-10-CM

## 2012-07-20 DIAGNOSIS — J3489 Other specified disorders of nose and nasal sinuses: Secondary | ICD-10-CM

## 2012-07-20 DIAGNOSIS — R319 Hematuria, unspecified: Secondary | ICD-10-CM

## 2012-07-20 DIAGNOSIS — K8010 Calculus of gallbladder with chronic cholecystitis without obstruction: Secondary | ICD-10-CM | POA: Diagnosis present

## 2012-07-20 DIAGNOSIS — K8000 Calculus of gallbladder with acute cholecystitis without obstruction: Principal | ICD-10-CM | POA: Diagnosis present

## 2012-07-20 DIAGNOSIS — E669 Obesity, unspecified: Secondary | ICD-10-CM | POA: Diagnosis present

## 2012-07-20 DIAGNOSIS — Z8719 Personal history of other diseases of the digestive system: Secondary | ICD-10-CM

## 2012-07-20 DIAGNOSIS — R1011 Right upper quadrant pain: Secondary | ICD-10-CM

## 2012-07-20 DIAGNOSIS — R109 Unspecified abdominal pain: Secondary | ICD-10-CM

## 2012-07-20 DIAGNOSIS — K8066 Calculus of gallbladder and bile duct with acute and chronic cholecystitis without obstruction: Secondary | ICD-10-CM

## 2012-07-20 DIAGNOSIS — Z5331 Laparoscopic surgical procedure converted to open procedure: Secondary | ICD-10-CM

## 2012-07-20 DIAGNOSIS — N200 Calculus of kidney: Secondary | ICD-10-CM | POA: Diagnosis present

## 2012-07-20 DIAGNOSIS — Z87891 Personal history of nicotine dependence: Secondary | ICD-10-CM

## 2012-07-20 DIAGNOSIS — R000 Tachycardia, unspecified: Secondary | ICD-10-CM

## 2012-07-20 DIAGNOSIS — H6590 Unspecified nonsuppurative otitis media, unspecified ear: Secondary | ICD-10-CM

## 2012-07-20 DIAGNOSIS — Z6841 Body Mass Index (BMI) 40.0 and over, adult: Secondary | ICD-10-CM

## 2012-07-20 DIAGNOSIS — K812 Acute cholecystitis with chronic cholecystitis: Secondary | ICD-10-CM | POA: Diagnosis present

## 2012-07-20 HISTORY — DX: Adverse effect of unspecified anesthetic, initial encounter: T41.45XA

## 2012-07-20 HISTORY — DX: Other complications of anesthesia, initial encounter: T88.59XA

## 2012-07-20 LAB — POCT URINALYSIS DIPSTICK
Ketones, UA: 160
Protein, UA: 30
Spec Grav, UA: 1.015

## 2012-07-20 LAB — URINALYSIS, ROUTINE W REFLEX MICROSCOPIC
Glucose, UA: NEGATIVE mg/dL
Ketones, ur: >80 mg/dL — AB
Leukocytes, UA: NEGATIVE
Protein, ur: 30 mg/dL — AB
Urobilinogen, UA: 1 mg/dL (ref 0.0–1.0)

## 2012-07-20 LAB — CBC WITH DIFFERENTIAL/PLATELET
Eosinophils Relative: 0 % (ref 0–5)
HCT: 44.1 % (ref 36.0–46.0)
Hemoglobin: 15.1 g/dL — ABNORMAL HIGH (ref 12.0–15.0)
Lymphocytes Relative: 9 % — ABNORMAL LOW (ref 12–46)
Lymphs Abs: 1 10*3/uL (ref 0.7–4.0)
MCV: 86.1 fL (ref 78.0–100.0)
Monocytes Absolute: 1.3 10*3/uL — ABNORMAL HIGH (ref 0.1–1.0)
Monocytes Relative: 11 % (ref 3–12)
Neutro Abs: 9.5 10*3/uL — ABNORMAL HIGH (ref 1.7–7.7)
RDW: 13.4 % (ref 11.5–15.5)
WBC: 11.8 10*3/uL — ABNORMAL HIGH (ref 4.0–10.5)

## 2012-07-20 LAB — URINE MICROSCOPIC-ADD ON

## 2012-07-20 LAB — POCT CBC
MCH, POC: 28.5 pg (ref 27–31.2)
MCV: 90.4 fL (ref 80–97)
MID (cbc): 1 — AB (ref 0–0.9)
POC LYMPH PERCENT: 13.3 %L (ref 10–50)
Platelet Count, POC: 215 10*3/uL (ref 142–424)
RDW, POC: 14.2 %
WBC: 12.8 10*3/uL — AB (ref 4.6–10.2)

## 2012-07-20 LAB — COMPREHENSIVE METABOLIC PANEL
AST: 16 U/L (ref 0–37)
BUN: 9 mg/dL (ref 6–23)
CO2: 28 mEq/L (ref 19–32)
Calcium: 9.2 mg/dL (ref 8.4–10.5)
Chloride: 97 mEq/L (ref 96–112)
Creatinine, Ser: 0.57 mg/dL (ref 0.50–1.10)
GFR calc Af Amer: >90 mL/min (ref >90)
GFR calc non Af Amer: >90 mL/min (ref >90)
Glucose, Bld: 125 mg/dL — ABNORMAL HIGH (ref 70–99)
Total Bilirubin: 0.6 mg/dL (ref 0.3–1.2)

## 2012-07-20 LAB — POCT UA - MICROSCOPIC ONLY
Crystals, Ur, HPF, POC: NEGATIVE
Mucus, UA: NEGATIVE

## 2012-07-20 LAB — POCT URINE PREGNANCY: Preg Test, Ur: NEGATIVE

## 2012-07-20 MED ORDER — DIPHENHYDRAMINE HCL 12.5 MG/5ML PO ELIX: 12.5000 mg | ORAL_SOLUTION | Freq: Four times a day (QID) | ORAL | Status: DC | PRN

## 2012-07-20 MED ORDER — HYDROMORPHONE HCL PF 1 MG/ML IJ SOLN
0.5000 mg | INTRAMUSCULAR | Status: DC | PRN
  Administered 2012-07-20 – 2012-07-21 (×7): 1 mg via INTRAVENOUS
  Filled 2012-07-20 (×7): qty 1

## 2012-07-20 MED ORDER — ONDANSETRON HCL 4 MG/2ML IJ SOLN
4.0000 mg | Freq: Once | INTRAMUSCULAR | Status: AC
  Administered 2012-07-20: 4 mg via INTRAVENOUS
  Filled 2012-07-20: qty 2

## 2012-07-20 MED ORDER — KCL IN DEXTROSE-NACL 20-5-0.45 MEQ/L-%-% IV SOLN
INTRAVENOUS | Status: DC
  Administered 2012-07-21: 04:00:00 via INTRAVENOUS
  Filled 2012-07-20 (×3): qty 1000

## 2012-07-20 MED ORDER — ACETAMINOPHEN 325 MG PO TABS: 650.0000 mg | ORAL_TABLET | Freq: Four times a day (QID) | ORAL | Status: DC | PRN

## 2012-07-20 MED ORDER — HEPARIN SODIUM (PORCINE) 5000 UNIT/ML IJ SOLN
5000.0000 [IU] | Freq: Three times a day (TID) | INTRAMUSCULAR | Status: AC
  Administered 2012-07-20: 5000 [IU] via SUBCUTANEOUS
  Filled 2012-07-20: qty 1

## 2012-07-20 MED ORDER — DIPHENHYDRAMINE HCL 50 MG/ML IJ SOLN: 12.5000 mg | Freq: Four times a day (QID) | INTRAMUSCULAR | Status: DC | PRN

## 2012-07-20 MED ORDER — CIPROFLOXACIN IN D5W 400 MG/200ML IV SOLN
400.0000 mg | Freq: Two times a day (BID) | INTRAVENOUS | Status: DC
  Administered 2012-07-20 – 2012-07-23 (×6): 400 mg via INTRAVENOUS
  Filled 2012-07-20 (×9): qty 200

## 2012-07-20 MED ORDER — HYDROMORPHONE HCL PF 1 MG/ML IJ SOLN
1.0000 mg | INTRAMUSCULAR | Status: DC | PRN
  Administered 2012-07-20 (×2): 1 mg via INTRAVENOUS
  Filled 2012-07-20 (×2): qty 1

## 2012-07-20 MED ORDER — OXYCODONE HCL 5 MG PO TABS: 5.0000 mg | ORAL_TABLET | ORAL | Status: DC | PRN

## 2012-07-20 MED ORDER — ACETAMINOPHEN 650 MG RE SUPP: 650.0000 mg | Freq: Four times a day (QID) | RECTAL | Status: DC | PRN

## 2012-07-20 MED ORDER — GI COCKTAIL ~~LOC~~: 30.0000 mL | Freq: Once | ORAL | Status: DC

## 2012-07-20 MED ORDER — SODIUM CHLORIDE 0.9 % IV SOLN
1000.0000 mL | Freq: Once | INTRAVENOUS | Status: AC
  Administered 2012-07-20: 1000 mL via INTRAVENOUS

## 2012-07-20 MED ORDER — ONDANSETRON HCL 4 MG/2ML IJ SOLN: 4.0000 mg | Freq: Four times a day (QID) | INTRAMUSCULAR | Status: DC | PRN

## 2012-07-20 MED ORDER — DOXYCYCLINE HYCLATE 100 MG PO TABS
100.0000 mg | ORAL_TABLET | Freq: Two times a day (BID) | ORAL | Status: DC
  Administered 2012-07-20 – 2012-07-25 (×9): 100 mg via ORAL
  Filled 2012-07-20 (×12): qty 1

## 2012-07-20 MED ORDER — SODIUM CHLORIDE 0.9 % IV SOLN
1000.0000 mL | INTRAVENOUS | Status: DC
  Administered 2012-07-20: 1000 mL via INTRAVENOUS

## 2012-07-20 MED ORDER — HEPARIN SODIUM (PORCINE) 5000 UNIT/ML IJ SOLN
5000.0000 [IU] | Freq: Three times a day (TID) | INTRAMUSCULAR | Status: DC
  Filled 2012-07-20 (×3): qty 1

## 2012-07-20 NOTE — ED Provider Notes (Signed)
History     CSN: [id number]  Arrival date & time 07/20/12  1211   First MD Initiated Contact with Patient 07/20/12 1250      Chief Complaint  Patient presents with  . Abdominal Pain  . Back Pain    (Consider location/radiation/quality/duration/timing/severity/associated sxs/prior treatment) Patient is a 52 y.o. female presenting with abdominal pain and back pain. The history is provided by the patient.  Abdominal Pain The primary symptoms of the illness include abdominal pain and nausea. The primary symptoms of the illness do not include fever, fatigue, vomiting, diarrhea or dysuria. The current episode started more than 2 days ago. The onset of the illness was gradual. The problem has been gradually worsening.  The abdominal pain is located in the epigastric region. The abdominal pain radiates to the back. Eating (deep breathing and lying flat) relieves the abdominal pain.  Additional symptoms associated with the illness include back pain.  Back Pain  Associated symptoms include abdominal pain. Pertinent negatives include no fever and no dysuria.   Pt went to an urgent care a couple of days ago.  She was noted to have some blood in her urine so they treated her presumptovely for kidney stones.  She has been taking pain medications but her symptoms have worsened.  She went back to the urgent care who told her to come to the ED.    History reviewed. No pertinent past medical history.  Past Surgical History  Procedure Date  . Cesarean section   . Cesarean section     Family History  Problem Relation Age of Onset  . Urolithiasis Son     History  Substance Use Topics  . Smoking status: Never Smoker   . Smokeless tobacco: Not on file  . Alcohol Use: Yes     Comment: Occassional Use    OB History    Grav Para Term Preterm Abortions TAB SAB Ect Mult Living                  Review of Systems  Constitutional: Negative for fever and fatigue.  Gastrointestinal: Positive  for nausea and abdominal pain. Negative for vomiting and diarrhea.  Genitourinary: Negative for dysuria.  Musculoskeletal: Positive for back pain.  All other systems reviewed and are negative.    Allergies  Review of patient's allergies indicates no known allergies.  Home Medications   Current Outpatient Rx  Name  Route  Sig  Dispense  Refill  . DOXYCYCLINE HYCLATE 100 MG PO TABS   Oral   Take 1 tablet (100 mg total) by mouth 2 (two) times daily.   14 tablet   0   . FLUTICASONE PROPIONATE 50 MCG/ACT NA SUSP   Nasal   Place 2 sprays into the nose daily.   16 g   6   . HYDROCODONE-ACETAMINOPHEN 5-325 MG PO TABS   Oral   Take 1 tablet by mouth every 6 (six) hours as needed for pain.   15 tablet   0   . ONE-DAILY MULTI VITAMINS PO TABS   Oral   Take 1 tablet by mouth daily.         Marland Kitchen NAPROXEN SODIUM 220 MG PO TABS   Oral   Take 220 mg by mouth 2 (two) times daily with a meal.         . OMEPRAZOLE 40 MG PO CPDR   Oral   Take 40 mg by mouth daily.         Marland Kitchen  TAMSULOSIN HCL 0.4 MG PO CAPS      Take one capsule per day to facilitate stone passage.   5 capsule   0   . HYDROCOD POLST-CPM POLST ER 10-8 MG/5ML PO LQCR   Oral   Take 5 mLs by mouth every 12 (twelve) hours as needed.   90 mL   0     BP 93/67  Pulse 16  Temp 98.7 F (37.1 C) (Oral)  LMP 05/28/2012  Physical Exam  Nursing note and vitals reviewed. Constitutional: She appears distressed.       Obese   HENT:  Head: Normocephalic and atraumatic.  Right Ear: External ear normal.  Left Ear: External ear normal.  Mouth/Throat: No oropharyngeal exudate.  Eyes: Conjunctivae normal are normal. Right eye exhibits no discharge. Left eye exhibits no discharge. No scleral icterus.  Neck: Neck supple. No tracheal deviation present.  Cardiovascular: Normal rate, regular rhythm and intact distal pulses.   Pulmonary/Chest: Effort normal and breath sounds normal. No stridor. No respiratory distress. She  has no wheezes. She has no rales.  Abdominal: Soft. Bowel sounds are normal. She exhibits no distension and no mass. There is tenderness in the right upper quadrant and epigastric area. There is guarding. There is no rigidity and no rebound. No hernia.  Musculoskeletal: She exhibits no edema and no tenderness.  Neurological: She is alert. She has normal strength. No sensory deficit. Cranial nerve deficit:  no gross defecits noted. She exhibits normal muscle tone. She displays no seizure activity. Coordination normal.  Skin: Skin is warm and dry. No rash noted.  Psychiatric: She has a normal mood and affect.    ED Course  Procedures (including critical care time)  Labs Reviewed  CBC WITH DIFFERENTIAL - Abnormal; Notable for the following:    WBC 11.8 (*)     RBC 5.12 (*)     Hemoglobin 15.1 (*)     Neutrophils Relative 81 (*)     Neutro Abs 9.5 (*)     Lymphocytes Relative 9 (*)     Monocytes Absolute 1.3 (*)     All other components within normal limits  COMPREHENSIVE METABOLIC PANEL - Abnormal; Notable for the following:    Glucose, Bld 125 (*)     All other components within normal limits  URINALYSIS, ROUTINE W REFLEX MICROSCOPIC - Abnormal; Notable for the following:    Color, Urine AMBER (*)  BIOCHEMICALS MAY BE AFFECTED BY COLOR   Hgb urine dipstick LARGE (*)     Bilirubin Urine SMALL (*)     Ketones, ur >80 (*)     Protein, ur 30 (*)     All other components within normal limits  URINE MICROSCOPIC-ADD ON - Abnormal; Notable for the following:    Squamous Epithelial / LPF FEW (*)     All other components within normal limits  LIPASE, BLOOD   Dg Chest 2 View  07/20/2012  *RADIOLOGY REPORT*  Clinical Data: Chest, right upper quadrant pain.  CHEST - 2 VIEW  Comparison: 06/16/2012  Findings: Heart and mediastinal contours are within normal limits. No focal opacities or effusions.  No acute bony abnormality. Degenerative changes in the thoracic spine  IMPRESSION: No acute  cardiopulmonary disease.   Original Report Authenticated By: Charlett Nose, M.D.    Dg Abd 1 View  07/20/2012  *RADIOLOGY REPORT*  Clinical Data: Right upper quadrant pain.  ABDOMEN - 1 VIEW  Comparison: 07/17/2012  Findings: Moderate stool throughout the colon. There is a  nonobstructive bowel gas pattern.  No supine evidence of free air. No organomegaly or suspicious calcification.  No acute bony abnormality.  IMPRESSION: Moderate stool burden.  No acute findings.   Original Report Authenticated By: Charlett Nose, M.D.    US Abdomen Complete  07/20/2012  *RADIOLOGY REPORT*  Clinical Data:  Abdominal and back pain.  COMPLETE ABDOMINAL ULTRASOUND  Comparison:  CT abdomen pelvis 02/21/2012.  Findings:  Gallbladder:  Scattered echogenic stones measure up to 1.2 cm. There is nonshadowing echogenic sludge as well.  Gallbladder wall measures 4 mm.  Trace pericholecystic fluid.  Negative for sonographic Murphy's sign.  Common bile duct:  Measures 3 mm, within normal limits.  Liver:  Diffusely increased in echogenicity.  IVC:  Appears normal.  Pancreas:  No focal abnormality seen.  Spleen:  Measures 10.6 cm, negative.  Right Kidney:  Measures 13.2 cm.  Parenchymal echogenicity is normal but there is cortical thinning.  No hydronephrosis.  No focal lesion.  Left Kidney:  Measures 14.1 cm.  Parenchymal echogenicity is normal but there is cortical thinning.  No hydronephrosis.  No focal lesion.  Abdominal aorta:  Proximal abdominal aorta is obscured by bowel gas.  Otherwise, no aneurysm.  IMPRESSION:  1. Cholelithiasis and sludge, with gallbladder wall thickening and trace pericholecystic fluid.  In the absence of a positive sonographic Murphy's sign, chronic cholecystitis is considered. 2.  Hepatic steatosis.   Original Report Authenticated By: Leanna Battles, M.D.      1. Cholecystitis       MDM  The patient's symptoms improved after IV pain medications. She still does have tenderness to palpation on exam  however.  Considering her elevated white blood cell count and ultrasound findings, I will consult with general surgery regarding further treatment.        Celene Kras, MD 07/20/12 [phone]

## 2012-07-20 NOTE — H&P (Signed)
Agree with above.she has ruq tenderness, elevated wbc and u/s showing stones.  Clinically she has cholecystitis. Will plan lap chole in am I discussed the procedure in detail.  We discussed the risks and benefits of a laparoscopic cholecystectomy and possible cholangiogram including, but not limited to bleeding, infection, injury to surrounding structures such as the intestine or liver, bile leak, retained gallstones, need to convert to an open procedure, prolonged diarrhea, blood clots such as  DVT, common bile duct injury, anesthesia risks, and possible need for additional procedures.  The likelihood of improvement in symptoms and return to the patient's normal status is good. We discussed the typical post-operative recovery course.

## 2012-07-20 NOTE — ED Notes (Signed)
Pt reports central epigastric pain radiating to right side of back. Seen at Urgent Care and sent for evaluation of gallbladder.

## 2012-07-20 NOTE — ED Notes (Signed)
Patient request to wait until her IV infusion is completed to attempt to urinate

## 2012-07-20 NOTE — Progress Notes (Signed)
[facility] [address] [place] [address] [phone]  Date:  07/20/2012   Name:  Tiffany Glover   DOB:  [birth year]   MRN:  [id number]  PCP:  Pcp Not In System    Chief Complaint: Shortness of Breath and Back Pain   History of Present Illness:  Tiffany Glover is a 52 y.o. very pleasant female patient who presents with the following:  Here today for a recheck- she was here on 07/17/12 and was treated for back pain/ possible nephrolithiasis and a tick exposure with lortab, flomax, and doxycycline.   She notes that she seems to be getting worse.  She notes that she still has right flank/ upper back pain- however this now seems to come into the front of her chest and epigastric/ RUQ area as well.  Her belly seems to "get hard."  The area is tender to touch.  Having her bra on the area is uncomfortable. She also has pain with breathing deeply.  No cough.   She was up all night last night with pain, is currently in tears.   She had not noted a fever at home. She does feel hot however, and states that her usual temp is 97 degrees.  She has noted chills and the ache in her side.  No other body aches.   She still has her gallbladder- a CT 8/13 did not show any abnormality  She has been able to eat, but it makes her pain worse worse.  She is feeling nauseated.  She drank water today so far, but has not eaten anything Her menses have been irregular over the last year or so.  She is SA- her partner uses condoms.  Patient Active Problem List  Diagnosis  . HYPOTHYROIDISM  . INTERNAL HEMORRHOIDS  . EXTERNAL HEMORRHOIDS  . DIVERTICULOSIS OF COLON    No past medical history on file.  Past Surgical History  Procedure Date  . Cesarean section   . Cesarean section     History  Substance Use Topics  . Smoking status: Never Smoker   . Smokeless tobacco: Not on file  . Alcohol Use: Yes     Comment: Occassional Use    Family History  Problem Relation Age of  Onset  . Urolithiasis Son     No Known Allergies  Medication list has been reviewed and updated.  Current Outpatient Prescriptions on File Prior to Visit  Medication Sig Dispense Refill  . doxycycline (VIBRA-TABS) 100 MG tablet Take 1 tablet (100 mg total) by mouth 2 (two) times daily.  14 tablet  0  . HYDROcodone-acetaminophen (NORCO/VICODIN) 5-325 MG per tablet Take 1 tablet by mouth every 6 (six) hours as needed for pain.  15 tablet  0  . Tamsulosin HCl (FLOMAX) 0.4 MG CAPS Take one capsule per day to facilitate stone passage.  5 capsule  0  . chlorpheniramine-HYDROcodone (TUSSIONEX PENNKINETIC ER) 10-8 MG/5ML LQCR Take 5 mLs by mouth every 12 (twelve) hours as needed.  90 mL  0  . fluticasone (FLONASE) 50 MCG/ACT nasal spray Place 2 sprays into the nose daily.  16 g  6  . ipratropium (ATROVENT) 0.03 % nasal spray Place 2 sprays into the nose 4 (four) times daily.  30 mL  6  . Multiple Vitamin (MULTIVITAMIN) tablet Take 1 tablet by mouth daily.      . naproxen sodium (ANAPROX) 220 MG tablet Take 220 mg by mouth 2 (two) times daily with  a meal.      . omeprazole (PRILOSEC) 40 MG capsule Take 40 mg by mouth daily.      . predniSONE (DELTASONE) 20 MG tablet 3 PO FOR 3 DAYS, 2 PO FOR 3 DAYS, 1 PO FOR 3 DAYS  18 tablet  0    Review of Systems:  As per HPI- otherwise negative.   Physical Examination: Filed Vitals:   07/20/12 0948  BP: 138/75  Pulse: 121  Temp: 99.2 F (37.3 C)  Resp: 18   Filed Vitals:   07/20/12 0948  Height: 5\' 5"  (1.651 m)  Weight: 260 lb 9.6 oz (118.207 kg)   Body mass index is 43.37 kg/(m^2). Ideal Body Weight: Weight in (lb) to have BMI = 25: 149.9   GEN: WDWN, NAD, Non-toxic, A & O x 3, obese, tearful HEENT: Atraumatic, Normocephalic. Neck supple. No masses, No LAD. Bilateral TM wnl, oropharynx normal.  PEERL,EOMI.   Ears and Nose: No external deformity. CV: RRR- tachycardic, No M/G/R. No JVD. No thrill. No extra heart sounds. PULM: CTA B, no  wheezes, crackles, rhonchi. No retractions. No resp. distress. No accessory muscle use. ABD: Significant RUQ tenderness, not able to perform Murphy's due to tenderness  +BS. No rebound. No HSM.  No rash to suggest shingles EXTR: No c/c/e NEURO Normal gait.  PSYCH: Normally interactive. Conversant. Not depressed or anxious appearing.  Calm demeanor.   [facility] reading (PRIMARY) by  Dr. Patsy Lager. CXR:  negative Abdomen:  Normal, no free air  Given GI cocktail- helped a little bit  Results for orders placed in visit on 07/20/12  POCT UA - MICROSCOPIC ONLY      Component Value Range   WBC, Ur, HPF, POC 1-3     RBC, urine, microscopic 7-10     Bacteria, U Microscopic trace     Mucus, UA neg     Epithelial cells, urine per micros 1-3     Crystals, Ur, HPF, POC neg     Casts, Ur, LPF, POC neg     Yeast, UA neg    POCT URINALYSIS DIPSTICK      Component Value Range   Color, UA yellow     Clarity, UA clear     Glucose, UA neg     Bilirubin, UA small     Ketones, UA 160     Spec Grav, UA 1.015     Blood, UA large     pH, UA 7.0     Protein, UA 30     Urobilinogen, UA 1.0     Nitrite, UA neg     Leukocytes, UA Negative    POCT URINE PREGNANCY      Component Value Range   Preg Test, Ur Negative    POCT CBC      Component Value Range   WBC 12.8 (*) 4.6 - 10.2 K/uL   Lymph, poc 1.7  0.6 - 3.4   POC LYMPH PERCENT 13.3  10 - 50 %L   MID (cbc) 1.0 (*) 0 - 0.9   POC MID % 7.5  0 - 12 %M   POC Granulocyte 10.1 (*) 2 - 6.9   Granulocyte percent 79.2  37 - 80 %G   RBC 5.29  4.04 - 5.48 M/uL   Hemoglobin 15.1  12.2 - 16.2 g/dL   HCT, POC 16.1  09.6 - 47.9 %   MCV 90.4  80 - 97 fL   MCH, POC 28.5  27 - 31.2 pg  MCHC 31.6 (*) 31.8 - 35.4 g/dL   RDW, POC 57.8     Platelet Count, POC 215  142 - 424 K/uL   MPV 10.5  0 - 99.8 fL    Assessment and Plan: 1. RUQ pain  POCT urine pregnancy, DG Chest 2 View, DG Abd 1 View, gi cocktail (Maalox,Lidocaine,Donnatal), POCT CBC  2. Hematuria  POCT  UA - Microscopic Only, POCT urinalysis dipstick, POCT CBC  3. Tachycardia     Tiffany Glover is here today with symptoms that do suggest acute gallbladder colic/ acute cholecystitis.  However, she also has a history of obstructive right sided nephrolithiasis.   Will refer to the ED for further evaluation.  Appreciate ER care of this nice patient.  If she does not have acute cholecystitis consider recurrent nephrolithiasis.   Offered EMS transport but she declined at this time.    Recheck pulse 108 Ellese Julius, MD

## 2012-07-20 NOTE — Patient Instructions (Addendum)
Please proceed to [facility] ER.  They will see you and evaluate your gallbladder.  Let us know if we can be of any help.    Do not eat or drink anything while awaiting your evaluation

## 2012-07-20 NOTE — H&P (Addendum)
Tiffany Glover is an 52 y.o. female.   Chief Complaint: Abdominal pain and nausea, on and off for 1 year. Urgent Care:  Dr. Warner Mccreedy HPI: The patient is a 52 year old female who has had abdominal pain on and off for one year. Last Friday she went to urgent care with blood in the urine was placed on Flomax and narcotics for pain. Her symptoms have continued pain is persistent she presents back to the emergency room with ongoing abdominal pain. She's not a related to any specific kind of foods or activity. Her husband as he did get a [employer] and she had chicken when this started.  Workup in the ER shows mildly elevated white count. Abdominal ultrasound shows stones measuring up to 1.2 cm along with echogenic sludge. Gallbladder wall was thickened at 4 mm and there is a trace of pericholecystic fluid she also has hepatic steatosis. We were asked in consultation in the ER.  History reviewed. No pertinent past medical history.  Past Surgical History  Procedure Date  . Cesarean section   . Hx of tobacco use BMI 43.3     Family History  Problem Relation Age of Onset  . Urolithiasis Father committed suicide  Mother with a history of lung cancer with metastasis  1 brother with  mouth cancer  Son    Social History:  reports that she has never smoked. She does not have any smokeless tobacco history on file. She reports that she drinks alcohol. She reports that she does not use illicit drugs. Tobacco 1.5 PPD for 20 years, none since 1996.  Allergies: No Known Allergies Prior to Admission medications   Medication Sig Start Date End Date Taking? Authorizing Provider  doxycycline (VIBRA-TABS) 100 MG tablet Take 1 tablet (100 mg total) by mouth 2 (two) times daily. 07/17/12  Yes Shade Flood, MD  fluticasone (FLONASE) 50 MCG/ACT nasal spray Place 2 sprays into the nose daily. 07/03/12  Yes Ryan M Dunn, PA-C  HYDROcodone-acetaminophen (NORCO/VICODIN) 5-325 MG per tablet Take 1 tablet by mouth every  6 (six) hours as needed for pain. 07/17/12  Yes Shade Flood, MD  Multiple Vitamin (MULTIVITAMIN) tablet Take 1 tablet by mouth daily.   Yes Historical Provider, MD  naproxen sodium (ANAPROX) 220 MG tablet Take 220 mg by mouth 2 (two) times daily with a meal.   Yes Historical Provider, MD  omeprazole (PRILOSEC) 40 MG capsule Take 40 mg by mouth daily.   Yes Historical Provider, MD  Tamsulosin HCl (FLOMAX) 0.4 MG CAPS Take one capsule per day to facilitate stone passage. 07/17/12  Yes Shade Flood, MD  chlorpheniramine-HYDROcodone [facility] PENNKINETIC ER) 10-8 MG/5ML LQCR Take 5 mLs by mouth every 12 (twelve) hours as needed. 07/03/12   Sondra Barges, PA-C    (Not in a hospital admission)  Results for orders placed during the hospital encounter of 07/20/12 (from the past 48 hour(s))  CBC WITH DIFFERENTIAL     Status: Abnormal   Collection Time   07/20/12 12:29 PM      Component Value Range Comment   WBC 11.8 (*) 4.0 - 10.5 K/uL    RBC 5.12 (*) 3.87 - 5.11 MIL/uL    Hemoglobin 15.1 (*) 12.0 - 15.0 g/dL    HCT 30.8  65.7 - 84.6 %    MCV 86.1  78.0 - 100.0 fL    MCH 29.5  26.0 - 34.0 pg    MCHC 34.2  30.0 - 36.0 g/dL  RDW 13.4  11.5 - 15.5 %    Platelets 211  150 - 400 K/uL    Neutrophils Relative 81 (*) 43 - 77 %    Neutro Abs 9.5 (*) 1.7 - 7.7 K/uL    Lymphocytes Relative 9 (*) 12 - 46 %    Lymphs Abs 1.0  0.7 - 4.0 K/uL    Monocytes Relative 11  3 - 12 %    Monocytes Absolute 1.3 (*) 0.1 - 1.0 K/uL    Eosinophils Relative 0  0 - 5 %    Eosinophils Absolute 0.0  0.0 - 0.7 K/uL    Basophils Relative 0  0 - 1 %    Basophils Absolute 0.0  0.0 - 0.1 K/uL   COMPREHENSIVE METABOLIC PANEL     Status: Abnormal   Collection Time   07/20/12 12:29 PM      Component Value Range Comment   Sodium 136  135 - 145 mEq/L    Potassium 3.5  3.5 - 5.1 mEq/L    Chloride 97  96 - 112 mEq/L    CO2 28  19 - 32 mEq/L    Glucose, Bld 125 (*) 70 - 99 mg/dL    BUN 9  6 - 23 mg/dL    Creatinine, Ser  1.61  0.50 - 1.10 mg/dL    Calcium 9.2  8.4 - 09.6 mg/dL    Total Protein 7.5  6.0 - 8.3 g/dL    Albumin 3.6  3.5 - 5.2 g/dL    AST 16  0 - 37 U/L    ALT 22  0 - 35 U/L    Alkaline Phosphatase 55  39 - 117 U/L    Total Bilirubin 0.6  0.3 - 1.2 mg/dL    GFR calc non Af Amer >90  >90 mL/min    GFR calc Af Amer >90  >90 mL/min   LIPASE, BLOOD     Status: Normal   Collection Time   07/20/12 12:29 PM      Component Value Range Comment   Lipase 14  11 - 59 U/L   URINALYSIS, ROUTINE W REFLEX MICROSCOPIC     Status: Abnormal   Collection Time   07/20/12  2:19 PM      Component Value Range Comment   Color, Urine AMBER (*) YELLOW BIOCHEMICALS MAY BE AFFECTED BY COLOR   APPearance CLEAR  CLEAR    Specific Gravity, Urine 1.029  1.005 - 1.030    pH 6.5  5.0 - 8.0    Glucose, UA NEGATIVE  NEGATIVE mg/dL    Hgb urine dipstick LARGE (*) NEGATIVE    Bilirubin Urine SMALL (*) NEGATIVE    Ketones, ur >80 (*) NEGATIVE mg/dL    Protein, ur 30 (*) NEGATIVE mg/dL    Urobilinogen, UA 1.0  0.0 - 1.0 mg/dL    Nitrite NEGATIVE  NEGATIVE    Leukocytes, UA NEGATIVE  NEGATIVE   URINE MICROSCOPIC-ADD ON     Status: Abnormal   Collection Time   07/20/12  2:19 PM      Component Value Range Comment   Squamous Epithelial / LPF FEW (*) RARE    WBC, UA 0-2  <3 WBC/hpf    RBC / HPF 0-2  <3 RBC/hpf    Bacteria, UA RARE  RARE    Urine-Other MUCOUS PRESENT      Dg Chest 2 View  07/20/2012  *RADIOLOGY REPORT*  Clinical Data: Chest, right upper quadrant pain.  CHEST - 2  VIEW  Comparison: 06/16/2012  Findings: Heart and mediastinal contours are within normal limits. No focal opacities or effusions.  No acute bony abnormality. Degenerative changes in the thoracic spine  IMPRESSION: No acute cardiopulmonary disease.   Original Report Authenticated By: Charlett Nose, M.D.    Dg Abd 1 View  07/20/2012  *RADIOLOGY REPORT*  Clinical Data: Right upper quadrant pain.  ABDOMEN - 1 VIEW  Comparison: 07/17/2012  Findings: Moderate  stool throughout the colon. There is a nonobstructive bowel gas pattern.  No supine evidence of free air. No organomegaly or suspicious calcification.  No acute bony abnormality.  IMPRESSION: Moderate stool burden.  No acute findings.   Original Report Authenticated By: Charlett Nose, M.D.    US Abdomen Complete  07/20/2012  *RADIOLOGY REPORT*  Clinical Data:  Abdominal and back pain.  COMPLETE ABDOMINAL ULTRASOUND  Comparison:  CT abdomen pelvis 02/21/2012.  Findings:  Gallbladder:  Scattered echogenic stones measure up to 1.2 cm. There is nonshadowing echogenic sludge as well.  Gallbladder wall measures 4 mm.  Trace pericholecystic fluid.  Negative for sonographic Murphy's sign.  Common bile duct:  Measures 3 mm, within normal limits.  Liver:  Diffusely increased in echogenicity.  IVC:  Appears normal.  Pancreas:  No focal abnormality seen.  Spleen:  Measures 10.6 cm, negative.  Right Kidney:  Measures 13.2 cm.  Parenchymal echogenicity is normal but there is cortical thinning.  No hydronephrosis.  No focal lesion.  Left Kidney:  Measures 14.1 cm.  Parenchymal echogenicity is normal but there is cortical thinning.  No hydronephrosis.  No focal lesion.  Abdominal aorta:  Proximal abdominal aorta is obscured by bowel gas.  Otherwise, no aneurysm.  IMPRESSION:  1. Cholelithiasis and sludge, with gallbladder wall thickening and trace pericholecystic fluid.  In the absence of a positive sonographic Murphy's sign, chronic cholecystitis is considered. 2.  Hepatic steatosis.   Original Report Authenticated By: Leanna Battles, M.D.     Review of Systems  Constitutional: Negative for fever.       Temp up to 99, she says hers in normally 97.  HENT: Negative.   Eyes: Negative.   Respiratory: Negative.   Cardiovascular: Negative.   Gastrointestinal: Positive for heartburn, nausea and abdominal pain. Negative for vomiting, diarrhea, constipation and blood in stool.  Genitourinary: Positive for hematuria.       Some  odor  Musculoskeletal: Negative.   Skin: Negative.   Neurological: Negative.   Endo/Heme/Allergies: Negative.   Psychiatric/Behavioral: Negative.     Blood pressure 118/59, pulse 109, temperature 98.7 F (37.1 C), temperature source Oral, last menstrual period 05/28/2012, SpO2 93.00%. Physical Exam  Constitutional: She is oriented to person, place, and time. She appears well-developed and well-nourished. No distress.  HENT:  Head: Normocephalic and atraumatic.  Nose: Nose normal.  Eyes: Conjunctivae normal and EOM are normal. Pupils are equal, round, and reactive to light.       Some conjunctival redness  Neck: Normal range of motion. Neck supple. No JVD present. No tracheal deviation present. No thyromegaly present.  Cardiovascular: Normal rate, regular rhythm, normal heart sounds and intact distal pulses.  Exam reveals no gallop.   No murmur heard. Respiratory: Effort normal and breath sounds normal. No stridor. No respiratory distress. She has no wheezes. She has no rales. She exhibits no tenderness.  GI: Soft. Bowel sounds are normal. She exhibits no distension and no mass. There is tenderness (Tender on the right side, more in the RUQ  than the RLQ). There  is no rebound and no guarding.  Musculoskeletal: Normal range of motion. She exhibits no edema.  Lymphadenopathy:    She has no cervical adenopathy.  Neurological: She is alert and oriented to person, place, and time. No cranial nerve deficit.  Skin: Skin is warm and dry. No rash noted. No erythema.  Psychiatric: She has a normal mood and affect. Her behavior is normal. Judgment and thought content normal.     Assessment/Plan 1. Chronic and acute cholecystitis with cholelithiasis 2. History of hypothyroidism currently not on supplement 3. 20 year history of tobacco use none since 1996. 4. History of diverticulosis 5. Nephrolithiasis with hematuria Plan: We'll admit the patient place her on an antibiotic, hydration plan  elective cholecystectomy AM.  Sherrie George 07/20/2012, 4:06 PM

## 2012-07-21 ENCOUNTER — Encounter (HOSPITAL_COMMUNITY): Payer: Self-pay | Admitting: Registered Nurse

## 2012-07-21 ENCOUNTER — Encounter (HOSPITAL_COMMUNITY): Admission: EM | Disposition: A | Discharge status: Home / Self Care | Payer: Self-pay | Source: Home / Self Care

## 2012-07-21 ENCOUNTER — Observation Stay (HOSPITAL_COMMUNITY): Payer: BC Managed Care – PPO | Admitting: Registered Nurse

## 2012-07-21 ENCOUNTER — Observation Stay (HOSPITAL_COMMUNITY): Payer: BC Managed Care – PPO

## 2012-07-21 HISTORY — PX: CHOLECYSTECTOMY: SHX55

## 2012-07-21 LAB — CBC
HCT: 38.6 % (ref 36.0–46.0)
Hemoglobin: 12.8 g/dL (ref 12.0–15.0)
MCHC: 33.2 g/dL (ref 30.0–36.0)
RBC: 4.38 MIL/uL (ref 3.87–5.11)

## 2012-07-21 LAB — COMPREHENSIVE METABOLIC PANEL WITH GFR
ALT: 23 U/L (ref 0–35)
AST: 22 U/L (ref 0–37)
Albumin: 2.9 g/dL — ABNORMAL LOW (ref 3.5–5.2)
Alkaline Phosphatase: 48 U/L (ref 39–117)
BUN: 7 mg/dL (ref 6–23)
CO2: 29 meq/L (ref 19–32)
Calcium: 8.3 mg/dL — ABNORMAL LOW (ref 8.4–10.5)
Chloride: 94 meq/L — ABNORMAL LOW (ref 96–112)
Creatinine, Ser: 0.58 mg/dL (ref 0.50–1.10)
GFR calc Af Amer: >90 mL/min
GFR calc non Af Amer: >90 mL/min
Glucose, Bld: 122 mg/dL — ABNORMAL HIGH (ref 70–99)
Potassium: 3.7 meq/L (ref 3.5–5.1)
Sodium: 130 meq/L — ABNORMAL LOW (ref 135–145)
Total Bilirubin: 0.6 mg/dL (ref 0.3–1.2)
Total Protein: 6.4 g/dL (ref 6.0–8.3)

## 2012-07-21 LAB — LIPASE, BLOOD: Lipase: 11 U/L (ref 11–59)

## 2012-07-21 LAB — MRSA PCR SCREENING: MRSA by PCR: NEGATIVE

## 2012-07-21 SURGERY — LAPAROSCOPIC CHOLECYSTECTOMY WITH INTRAOPERATIVE CHOLANGIOGRAM
Anesthesia: General | Site: Abdomen | Wound class: Dirty or Infected

## 2012-07-21 MED ORDER — DIPHENHYDRAMINE HCL 12.5 MG/5ML PO ELIX: 12.5000 mg | ORAL_SOLUTION | Freq: Four times a day (QID) | ORAL | Status: DC | PRN

## 2012-07-21 MED ORDER — HYDROMORPHONE 0.3 MG/ML IV SOLN: INTRAVENOUS | Status: DC

## 2012-07-21 MED ORDER — ONDANSETRON HCL 4 MG/2ML IJ SOLN
INTRAMUSCULAR | Status: DC | PRN
  Administered 2012-07-21: 4 mg via INTRAVENOUS

## 2012-07-21 MED ORDER — ACETAMINOPHEN 650 MG RE SUPP: 650.0000 mg | Freq: Four times a day (QID) | RECTAL | Status: DC | PRN

## 2012-07-21 MED ORDER — FENTANYL CITRATE 0.05 MG/ML IJ SOLN
25.0000 ug | INTRAMUSCULAR | Status: DC | PRN
  Administered 2012-07-21 (×3): 50 ug via INTRAVENOUS

## 2012-07-21 MED ORDER — SODIUM CHLORIDE 0.9 % IJ SOLN: 9.0000 mL | INTRAMUSCULAR | Status: DC | PRN

## 2012-07-21 MED ORDER — PROPOFOL 10 MG/ML IV BOLUS
INTRAVENOUS | Status: DC | PRN
  Administered 2012-07-21: 200 mg via INTRAVENOUS

## 2012-07-21 MED ORDER — LIDOCAINE HCL (CARDIAC) 20 MG/ML IV SOLN
INTRAVENOUS | Status: DC | PRN
  Administered 2012-07-21: 100 mg via INTRAVENOUS

## 2012-07-21 MED ORDER — MEPERIDINE HCL 50 MG/ML IJ SOLN: 6.2500 mg | INTRAMUSCULAR | Status: DC | PRN

## 2012-07-21 MED ORDER — MIDAZOLAM HCL 5 MG/5ML IJ SOLN
INTRAMUSCULAR | Status: DC | PRN
  Administered 2012-07-21 (×2): 1 mg via INTRAVENOUS

## 2012-07-21 MED ORDER — DIPHENHYDRAMINE HCL 50 MG/ML IJ SOLN: 12.5000 mg | Freq: Four times a day (QID) | INTRAMUSCULAR | Status: DC | PRN

## 2012-07-21 MED ORDER — NALOXONE HCL 0.4 MG/ML IJ SOLN: 0.4000 mg | INTRAMUSCULAR | Status: DC | PRN

## 2012-07-21 MED ORDER — PANTOPRAZOLE SODIUM 40 MG PO TBEC
40.0000 mg | DELAYED_RELEASE_TABLET | Freq: Every day | ORAL | Status: DC
  Administered 2012-07-22 – 2012-07-25 (×4): 40 mg via ORAL
  Filled 2012-07-21 (×5): qty 1

## 2012-07-21 MED ORDER — FENTANYL CITRATE 0.05 MG/ML IJ SOLN
INTRAMUSCULAR | Status: DC | PRN
  Administered 2012-07-21 (×5): 50 ug via INTRAVENOUS

## 2012-07-21 MED ORDER — SUCCINYLCHOLINE CHLORIDE 20 MG/ML IJ SOLN
INTRAMUSCULAR | Status: DC | PRN
  Administered 2012-07-21: 100 mg via INTRAVENOUS

## 2012-07-21 MED ORDER — LACTATED RINGERS IV SOLN
INTRAVENOUS | Status: DC | PRN
  Administered 2012-07-21: 1000 mL

## 2012-07-21 MED ORDER — LACTATED RINGERS IV SOLN: INTRAVENOUS | Status: DC

## 2012-07-21 MED ORDER — MORPHINE SULFATE (PF) 1 MG/ML IV SOLN
INTRAVENOUS | Status: DC
  Administered 2012-07-21: 14:00:00 via INTRAVENOUS

## 2012-07-21 MED ORDER — LACTATED RINGERS IV SOLN
INTRAVENOUS | Status: DC
  Administered 2012-07-21 (×2): 1000 mL via INTRAVENOUS

## 2012-07-21 MED ORDER — NEOSTIGMINE METHYLSULFATE 1 MG/ML IJ SOLN
INTRAMUSCULAR | Status: DC | PRN
  Administered 2012-07-21: 5 mg via INTRAVENOUS

## 2012-07-21 MED ORDER — PROMETHAZINE HCL 25 MG/ML IJ SOLN: 6.2500 mg | INTRAMUSCULAR | Status: DC | PRN

## 2012-07-21 MED ORDER — ONDANSETRON HCL 4 MG/2ML IJ SOLN: 4.0000 mg | Freq: Four times a day (QID) | INTRAMUSCULAR | Status: DC | PRN

## 2012-07-21 MED ORDER — HYDROMORPHONE HCL PF 1 MG/ML IJ SOLN
INTRAMUSCULAR | Status: DC | PRN
  Administered 2012-07-21 (×3): 0.5 mg via INTRAVENOUS

## 2012-07-21 MED ORDER — KETOROLAC TROMETHAMINE 15 MG/ML IJ SOLN
15.0000 mg | Freq: Once | INTRAMUSCULAR | Status: AC
  Administered 2012-07-21: 15 mg via INTRAVENOUS

## 2012-07-21 MED ORDER — ACETAMINOPHEN 10 MG/ML IV SOLN: 1000.0000 mg | Freq: Four times a day (QID) | INTRAVENOUS | Status: DC

## 2012-07-21 MED ORDER — GLYCOPYRROLATE 0.2 MG/ML IJ SOLN
INTRAMUSCULAR | Status: DC | PRN
  Administered 2012-07-21: .8 mg via INTRAVENOUS

## 2012-07-21 MED ORDER — POTASSIUM CHLORIDE IN NACL 20-0.9 MEQ/L-% IV SOLN
INTRAVENOUS | Status: DC
  Administered 2012-07-21 – 2012-07-24 (×6): via INTRAVENOUS
  Filled 2012-07-21 (×10): qty 1000

## 2012-07-21 MED ORDER — ACETAMINOPHEN 325 MG PO TABS: 650.0000 mg | ORAL_TABLET | Freq: Four times a day (QID) | ORAL | Status: DC | PRN

## 2012-07-21 MED ORDER — ROCURONIUM BROMIDE 100 MG/10ML IV SOLN
INTRAVENOUS | Status: DC | PRN
  Administered 2012-07-21 (×2): 10 mg via INTRAVENOUS
  Administered 2012-07-21: 40 mg via INTRAVENOUS

## 2012-07-21 MED ORDER — FLUTICASONE PROPIONATE 50 MCG/ACT NA SUSP
2.0000 | Freq: Every day | NASAL | Status: DC
  Filled 2012-07-21: qty 16

## 2012-07-21 MED ORDER — MORPHINE SULFATE (PF) 1 MG/ML IV SOLN
INTRAVENOUS | Status: DC
  Administered 2012-07-21: 12 mg via INTRAVENOUS
  Administered 2012-07-21: 26.64 mg via INTRAVENOUS
  Administered 2012-07-22: 4.5 mg via INTRAVENOUS
  Administered 2012-07-22: 02:00:00 via INTRAVENOUS
  Administered 2012-07-22: 3 mg via INTRAVENOUS
  Administered 2012-07-22: 7.5 mg via INTRAVENOUS
  Administered 2012-07-22: 4.02 mg via INTRAVENOUS
  Administered 2012-07-22: 6 mg via INTRAVENOUS
  Administered 2012-07-23: 1.5 mg via INTRAVENOUS
  Administered 2012-07-23: 4.5 mg via INTRAVENOUS
  Administered 2012-07-23: 1.5 mg via INTRAVENOUS
  Administered 2012-07-23: 3 mg via INTRAVENOUS
  Administered 2012-07-24: 1.5 mg via INTRAVENOUS
  Administered 2012-07-24: 3 mg via INTRAVENOUS
  Filled 2012-07-21 (×3): qty 25

## 2012-07-21 MED ORDER — HEPARIN SODIUM (PORCINE) 5000 UNIT/ML IJ SOLN
5000.0000 [IU] | Freq: Three times a day (TID) | INTRAMUSCULAR | Status: DC
  Administered 2012-07-21 – 2012-07-25 (×11): 5000 [IU] via SUBCUTANEOUS
  Filled 2012-07-21 (×14): qty 1

## 2012-07-21 MED ORDER — DEXAMETHASONE SODIUM PHOSPHATE 10 MG/ML IJ SOLN
INTRAMUSCULAR | Status: DC | PRN
  Administered 2012-07-21: 10 mg via INTRAVENOUS

## 2012-07-21 MED ORDER — BUPIVACAINE LIPOSOME 1.3 % IJ SUSP
20.0000 mL | Freq: Once | INTRAMUSCULAR | Status: DC
  Filled 2012-07-21: qty 20

## 2012-07-21 SURGICAL SUPPLY — 55 items
APPLIER CLIP 5 13 M/L LIGAMAX5 (MISCELLANEOUS) ×2
APPLIER CLIP ROT 10 11.4 M/L (STAPLE)
BENZOIN TINCTURE PRP APPL 2/3 (GAUZE/BANDAGES/DRESSINGS) IMPLANT
CANISTER SUCTION 2500CC (MISCELLANEOUS) ×2 IMPLANT
CLIP APPLIE 5 13 M/L LIGAMAX5 (MISCELLANEOUS) ×1 IMPLANT
CLIP APPLIE ROT 10 11.4 M/L (STAPLE) IMPLANT
CLOTH BEACON ORANGE TIMEOUT ST (SAFETY) ×2 IMPLANT
COVER MAYO STAND STRL (DRAPES) ×2 IMPLANT
DECANTER SPIKE VIAL GLASS SM (MISCELLANEOUS) IMPLANT
DERMABOND ADVANCED (GAUZE/BANDAGES/DRESSINGS)
DERMABOND ADVANCED .7 DNX12 (GAUZE/BANDAGES/DRESSINGS) IMPLANT
DRAIN CHANNEL RND F F (WOUND CARE) ×2 IMPLANT
DRAPE C-ARM 42X72 X-RAY (DRAPES) ×2 IMPLANT
DRAPE LAPAROSCOPIC ABDOMINAL (DRAPES) ×2 IMPLANT
DRESSING TELFA ISLAND 4X8 (GAUZE/BANDAGES/DRESSINGS) ×4 IMPLANT
ELECT BLADE 6.5 EXT (BLADE) ×2 IMPLANT
ELECT CAUTERY BLADE 6.4 (BLADE) ×2 IMPLANT
ELECT REM PT RETURN 9FT ADLT (ELECTROSURGICAL) ×2
ELECTRODE REM PT RTRN 9FT ADLT (ELECTROSURGICAL) ×1 IMPLANT
EVACUATOR DRAINAGE 10X20 100CC (DRAIN) ×1 IMPLANT
EVACUATOR SILICONE 100CC (DRAIN) ×1
GLOVE BIO SURGEON STRL SZ7 (GLOVE) ×2 IMPLANT
GLOVE BIOGEL PI IND STRL 7.5 (GLOVE) ×1 IMPLANT
GLOVE BIOGEL PI INDICATOR 7.5 (GLOVE) ×1
GOWN PREVENTION PLUS LG XLONG (DISPOSABLE) ×4 IMPLANT
GOWN PREVENTION PLUS XLARGE (GOWN DISPOSABLE) ×8 IMPLANT
GOWN STRL NON-REIN LRG LVL3 (GOWN DISPOSABLE) ×2 IMPLANT
GOWN STRL REIN XL XLG (GOWN DISPOSABLE) ×2 IMPLANT
HEMOSTAT SNOW SURGICEL 2X4 (HEMOSTASIS) ×2 IMPLANT
KIT BASIN OR (CUSTOM PROCEDURE TRAY) ×2 IMPLANT
NS IRRIG 1000ML POUR BTL (IV SOLUTION) ×2 IMPLANT
PENCIL BUTTON HOLSTER BLD 10FT (ELECTRODE) ×2 IMPLANT
POUCH SPECIMEN RETRIEVAL 10MM (ENDOMECHANICALS) ×2 IMPLANT
SET CHOLANGIOGRAPH MIX (MISCELLANEOUS) IMPLANT
SET IRRIG TUBING LAPAROSCOPIC (IRRIGATION / IRRIGATOR) ×2 IMPLANT
SOLUTION ANTI FOG 6CC (MISCELLANEOUS) ×2 IMPLANT
SPONGE LAP 18X18 X RAY DECT (DISPOSABLE) ×4 IMPLANT
STRIP CLOSURE SKIN 1/2X4 (GAUZE/BANDAGES/DRESSINGS) IMPLANT
SUT MNCRL AB 4-0 PS2 18 (SUTURE) ×2 IMPLANT
SUT PDS AB 1 CTX 36 (SUTURE) ×4 IMPLANT
SUT SILK 2 0 (SUTURE) ×1
SUT SILK 2-0 18XBRD TIE 12 (SUTURE) ×1 IMPLANT
SUT SILK 3 0 (SUTURE) ×1
SUT SILK 3-0 18XBRD TIE 12 (SUTURE) ×1 IMPLANT
SUT VIC AB 2-0 CT1 27 (SUTURE) ×1
SUT VIC AB 2-0 CT1 TAPERPNT 27 (SUTURE) ×1 IMPLANT
SUT VIC AB 2-0 SH 27 (SUTURE) ×1
SUT VIC AB 2-0 SH 27X BRD (SUTURE) ×1 IMPLANT
TOWEL OR 17X26 10 PK STRL BLUE (TOWEL DISPOSABLE) ×4 IMPLANT
TRAY LAP CHOLE (CUSTOM PROCEDURE TRAY) ×2 IMPLANT
TROCAR BLADELESS OPT 5 75 (ENDOMECHANICALS) ×6 IMPLANT
TROCAR XCEL BLUNT TIP 100MML (ENDOMECHANICALS) ×2 IMPLANT
TROCAR XCEL NON-BLD 11X100MML (ENDOMECHANICALS) IMPLANT
TUBING INSUFFLATION 10FT LAP (TUBING) ×2 IMPLANT
YANKAUER SUCT BULB TIP 10FT TU (MISCELLANEOUS) ×2 IMPLANT

## 2012-07-21 NOTE — Op Note (Signed)
Preoperative diagnosis: Acute cholecystitis Postoperative diagnosis: Acute on chronic cholecystitis Procedure: Attempted laparoscopic cholecystectomy converted to open cholecystectomy Surgeon: Dr. Harden Mo Asst.: Dr. Wenda Low Anesthesia: Gen. Endotracheal Estimated blood loss: 100 cc Drains: 19 French Blake drain to the subhepatic space Complications: None Specimens: Gallbladder and contents to pathology Sponge needle count was correct x2 at operation Disposition to recovery in stable condition  Indications This is a 52 year old female who came with right upper quadrant pain and elevated white blood cell count with an ultrasound showing cholelithiasis. Her symptoms had occurred since Saturday. She had symptoms for over a year that had been intermittent sounded like biliary colic. We discussed admission, IV antibiotics, and a laparoscopic possible open cholecystectomy.  Procedure: After informed consent was obtained the patient was taken to the operating room. She was on a regimen of ciprofloxacin on the floor. Sequential compression devices were placed on her legs. She was then placed under general endotracheal anesthesia without complication. Her abdomen was prepped and draped in the standard sterile surgical fashion. Surgical timeout was then performed.  I infiltrated Marcaine below her umbilicus. Then a vertical incision and grasped her fascia. I entered her fascia sharply. Her peritoneum was entered bluntly. I placed a 0 Vicryl pursestring suture to the fascia. I then inserted a Hassan trocar and insufflated the abdomen to 15 mm mercury pressure. I then inserted 3 further 5 mm trocars in the epigastrium and right upper quadrant. This is done under direct vision without complication. The gallbladder was noted to be very adherent to the duodenum as well as the omentum. These were taken down bluntly. I was unable to grasp her gallbladder as it was very tense. I aspirated what appeared to  be purulence from the gallbladder. This was still very difficult to grasp as her wall and been probably 7-8 mm thick. I was able to retract it cephalad and lateral as much as I could. I came down either side and released the rind from around the gallbladder. After about 45 minutes of attempting to locate any structures in the triangle of became very clear that appeared her infundibulum was adherent to her common bile duct. I was unable to separate this safely. I decided at that point to perform an open cholecystectomy. I removed the laparoscopic equipment may consider upper quadrant. This carried through her rectus muscle and into the peritoneum. I then placed sponges and retractors and identified the gallbladder. I removed the gallbladder from the liver bed. This was very difficult to do even open. Her gallbladder was very thickened and had purulence associated with it. I evacuated the stones from her gallbladder through a hole in the top of the gallbladder. Once I got down towards her triangle I really was unable to identify any structures around here. I'd took it down to where it appeared to be a safe position that was right close to her cystic duct. There may be a small portion of the gallbladder still attached to this but it is difficult to tell due to the inflammation of my concern that this was adherent to the bile duct. I then amputated the gallbladder and oversewed it with a 2-0 Vicryl suture. I did place a clip in this position as well. Hemostasis was then obtained. I did place a piece of Surgicel in the liver bed. I placed a 43 [place] Blake drain in the subhepatic space. This was secured with a 2-0 nylon. I then closed the peritoneum with a 2-0 Vicryl. I then closed the  fascia with a #1 PDS. I irrigated the incision and close all the incisions with staples. Dressings were placed She tolerated this well was extubated and transferred to recovery in stable condition.

## 2012-07-21 NOTE — Preoperative (Signed)
Beta Blockers   Reason not to administer Beta Blockers:Not Applicable 

## 2012-07-21 NOTE — Care Management Note (Signed)
    Page 1 of 1   07/21/2012     3:46:45 PM   CARE MANAGEMENT NOTE 07/21/2012  Patient:  Tiffany Glover, Tiffany Glover   Account Number:  [id number]  Date Initiated:  07/21/2012  Documentation initiated by:  Lorenda Ishihara  Subjective/Objective Assessment:   52 yo female admitted s/p open chole. PTA lived at home with spouse.     Action/Plan:   Home when stable   Anticipated DC Date:  07/24/2012   Anticipated DC Plan:  HOME/SELF CARE      DC Planning Services  CM consult      Choice offered to / List presented to:             Status of service:  Completed, signed off Medicare Important Message given?   (If response is "NO", the following Medicare IM given date fields will be blank) Date Medicare IM given:   Date Additional Medicare IM given:    Discharge Disposition:  HOME/SELF CARE  Per UR Regulation:  Reviewed for med. necessity/level of care/duration of stay  If discussed at Long Length of Stay Meetings, dates discussed:    Comments:

## 2012-07-21 NOTE — H&P (Signed)
Agree with above I discussed the procedure in detail.   We discussed the risks and benefits of a laparoscopic cholecystectomy and possible cholangiogram including, but not limited to bleeding, infection, injury to surrounding structures such as the intestine or liver, bile leak, retained gallstones, need to convert to an open procedure, prolonged diarrhea, blood clots such as  DVT, common bile duct injury, anesthesia risks, and possible need for additional procedures.  The likelihood of improvement in symptoms and return to the patient's normal status is good. We discussed the typical post-operative recovery course.  

## 2012-07-21 NOTE — Anesthesia Preprocedure Evaluation (Addendum)
Anesthesia Evaluation  Patient identified by MRN, date of birth, ID band Patient awake    Reviewed: Allergy & Precautions, H&P , NPO status , Patient's Chart, lab work & pertinent test results  History of Anesthesia Complications (+) AWARENESS UNDER ANESTHESIA  Airway Mallampati: II TM Distance: >3 FB Neck ROM: Full    Dental No notable dental hx.    Pulmonary neg pulmonary ROS, former smoker,  breath sounds clear to auscultation  Pulmonary exam normal       Cardiovascular - Peripheral Vascular Disease negative cardio ROS  Rhythm:Regular Rate:Normal     Neuro/Psych negative neurological ROS  negative psych ROS   GI/Hepatic negative GI ROS, Neg liver ROS,   Endo/Other  negative endocrine ROSMorbid obesity  Renal/GU negative Renal ROS  negative genitourinary   Musculoskeletal negative musculoskeletal ROS (+)   Abdominal   Peds negative pediatric ROS (+)  Hematology negative hematology ROS (+)   Anesthesia Other Findings   Reproductive/Obstetrics negative OB ROS                          Anesthesia Physical Anesthesia Plan  ASA: II  Anesthesia Plan: General   Post-op Pain Management:    Induction: Intravenous  Airway Management Planned: Oral ETT  Additional Equipment:   Intra-op Plan:   Post-operative Plan: Extubation in OR  Informed Consent: I have reviewed the patients History and Physical, chart, labs and discussed the procedure including the risks, benefits and alternatives for the proposed anesthesia with the patient or authorized representative who has indicated his/her understanding and acceptance.   Dental advisory given  Plan Discussed with: CRNA  Anesthesia Plan Comments:         Anesthesia Quick Evaluation

## 2012-07-21 NOTE — Progress Notes (Signed)
Agree with above 

## 2012-07-21 NOTE — Progress Notes (Signed)
Pt received from PACU. Pt arousable. Still states she is having quite a bit of pain. Encouraged PCA and used teachback regarding pt's understanding of the use of the PCA. She successfully verbalized understanding. Abd port sites x 4 intact C/D with guaze. RLQ Jp drain charged. Pt positioned in bed and assist with comfort measures. VSS.

## 2012-07-21 NOTE — Progress Notes (Signed)
Subjective: Still pretty sore, and getting relief from pain med, but it wears off to soon  Objective: Vital signs in last 24 hours: Temp:  [98.4 F (36.9 C)-99.7 F (37.6 C)] 98.4 F (36.9 C) (01/21 0618) Pulse Rate:  [16-121] 91  (01/21 0618) Resp:  [18] 18  (01/21 0618) BP: (93-138)/(54-75) 132/70 mmHg (01/21 0618) SpO2:  [93 %-97 %] 95 % (01/21 0618) Weight:  [260 lb 9.6 oz (118.207 kg)] 260 lb 9.6 oz (118.207 kg) (01/20 1738) Last BM Date: 07/20/12 Afebrile, VSS, Na is down, labs OK  Intake/Output from previous day: 01/20 0701 - 01/21 0700 In: 1328.8 [I.V.:1328.8] Out: -  Intake/Output this shift:    General appearance: alert, cooperative and no distress GI: soft, very tender RUQ, + BS.  Lab Results:   Basename 07/21/12 0415 07/20/12 1229  WBC 9.8 11.8*  HGB 12.8 15.1*  HCT 38.6 44.1  PLT 155 211    BMET  Basename 07/21/12 0415 07/20/12 1229  NA 130* 136  K 3.7 3.5  CL 94* 97  CO2 29 28  GLUCOSE 122* 125*  BUN 7 9  CREATININE 0.58 0.57  CALCIUM 8.3* 9.2   PT/INR No results found for this basename: LABPROT:2,INR:2 in the last 72 hours   Lab 07/21/12 0415 07/20/12 1229  AST 22 16  ALT 23 22  ALKPHOS 48 55  BILITOT 0.6 0.6  PROT 6.4 7.5  ALBUMIN 2.9* 3.6     Lipase     Component Value Date/Time   LIPASE 11 07/21/2012 0415     Studies/Results: Dg Chest 2 View  07/20/2012  *RADIOLOGY REPORT*  Clinical Data: Chest, right upper quadrant pain.  CHEST - 2 VIEW  Comparison: 06/16/2012  Findings: Heart and mediastinal contours are within normal limits. No focal opacities or effusions.  No acute bony abnormality. Degenerative changes in the thoracic spine  IMPRESSION: No acute cardiopulmonary disease.   Original Report Authenticated By: Charlett Nose, M.D.    Dg Abd 1 View  07/20/2012  *RADIOLOGY REPORT*  Clinical Data: Right upper quadrant pain.  ABDOMEN - 1 VIEW  Comparison: 07/17/2012  Findings: Moderate stool throughout the colon. There is a  nonobstructive bowel gas pattern.  No supine evidence of free air. No organomegaly or suspicious calcification.  No acute bony abnormality.  IMPRESSION: Moderate stool burden.  No acute findings.   Original Report Authenticated By: Charlett Nose, M.D.    US Abdomen Complete  07/20/2012  *RADIOLOGY REPORT*  Clinical Data:  Abdominal and back pain.  COMPLETE ABDOMINAL ULTRASOUND  Comparison:  CT abdomen pelvis 02/21/2012.  Findings:  Gallbladder:  Scattered echogenic stones measure up to 1.2 cm. There is nonshadowing echogenic sludge as well.  Gallbladder wall measures 4 mm.  Trace pericholecystic fluid.  Negative for sonographic Murphy's sign.  Common bile duct:  Measures 3 mm, within normal limits.  Liver:  Diffusely increased in echogenicity.  IVC:  Appears normal.  Pancreas:  No focal abnormality seen.  Spleen:  Measures 10.6 cm, negative.  Right Kidney:  Measures 13.2 cm.  Parenchymal echogenicity is normal but there is cortical thinning.  No hydronephrosis.  No focal lesion.  Left Kidney:  Measures 14.1 cm.  Parenchymal echogenicity is normal but there is cortical thinning.  No hydronephrosis.  No focal lesion.  Abdominal aorta:  Proximal abdominal aorta is obscured by bowel gas.  Otherwise, no aneurysm.  IMPRESSION:  1. Cholelithiasis and sludge, with gallbladder wall thickening and trace pericholecystic fluid.  In the absence of a  positive sonographic Murphy's sign, chronic cholecystitis is considered. 2.  Hepatic steatosis.   Original Report Authenticated By: Leanna Battles, M.D.     Medications:    . ciprofloxacin  400 mg Intravenous BID  . doxycycline  100 mg Oral Q12H    Assessment/Plan 1. Chronic and acute cholecystitis with cholelithiasis  2. History of hypothyroidism currently not on supplement  3. 20 year history of tobacco use none since 1996.  4. History of diverticulosis  5. Nephrolithiasis with hematuria  Plan:  Add IV tylenol and to the OR later today.     LOS: 1 day     Ronte Parker 07/21/2012

## 2012-07-21 NOTE — Transfer of Care (Signed)
Immediate Anesthesia Transfer of Care Note  Patient: Tiffany Glover  Procedure(s) Performed: Procedure(s) (LRB) with comments: LAPAROSCOPIC CHOLECYSTECTOMY WITH INTRAOPERATIVE CHOLANGIOGRAM (N/A) - attempted CHOLECYSTECTOMY (N/A)  Patient Location: PACU  Anesthesia Type:General  Level of Consciousness: awake, alert , oriented and patient cooperative  Airway & Oxygen Therapy: Patient Spontanous Breathing and Patient connected to face mask oxygen  Post-op Assessment: Report given to PACU RN, Post -op Vital signs reviewed and stable and Patient moving all extremities  Post vital signs: Reviewed and stable  Complications: No apparent anesthesia complications

## 2012-07-22 ENCOUNTER — Telehealth (INDEPENDENT_AMBULATORY_CARE_PROVIDER_SITE_OTHER): Payer: Self-pay | Admitting: General Surgery

## 2012-07-22 ENCOUNTER — Encounter (HOSPITAL_COMMUNITY): Payer: Self-pay | Admitting: General Surgery

## 2012-07-22 LAB — COMPREHENSIVE METABOLIC PANEL
ALT: 81 U/L — ABNORMAL HIGH (ref 0–35)
AST: 59 U/L — ABNORMAL HIGH (ref 0–37)
Alkaline Phosphatase: 52 U/L (ref 39–117)
CO2: 28 mEq/L (ref 19–32)
Chloride: 96 mEq/L (ref 96–112)
GFR calc non Af Amer: >90 mL/min (ref >90)
Potassium: 4.6 mEq/L (ref 3.5–5.1)
Sodium: 132 mEq/L — ABNORMAL LOW (ref 135–145)
Total Bilirubin: 0.3 mg/dL (ref 0.3–1.2)

## 2012-07-22 LAB — CBC
MCH: 29.2 pg (ref 26.0–34.0)
MCHC: 33.5 g/dL (ref 30.0–36.0)
MCV: 87 fL (ref 78.0–100.0)
Platelets: 172 10*3/uL (ref 150–400)

## 2012-07-22 LAB — URINE CULTURE: Colony Count: 6000

## 2012-07-22 MED ORDER — KETOROLAC TROMETHAMINE 15 MG/ML IJ SOLN
15.0000 mg | Freq: Three times a day (TID) | INTRAMUSCULAR | Status: DC | PRN
  Administered 2012-07-22: 15 mg via INTRAVENOUS
  Filled 2012-07-22: qty 1

## 2012-07-22 NOTE — Progress Notes (Signed)
Agree with above, will add toradol, pulm toilet

## 2012-07-22 NOTE — Anesthesia Postprocedure Evaluation (Signed)
  Anesthesia Post-op Note  Patient: Tiffany Glover  Procedure(s) Performed: Procedure(s) (LRB): LAPAROSCOPIC CHOLECYSTECTOMY WITH INTRAOPERATIVE CHOLANGIOGRAM (N/A) CHOLECYSTECTOMY (N/A)  Patient Location: PACU  Anesthesia Type: General  Level of Consciousness: awake and alert   Airway and Oxygen Therapy: Patient Spontanous Breathing  Post-op Pain: mild  Post-op Assessment: Post-op Vital signs reviewed, Patient's Cardiovascular Status Stable, Respiratory Function Stable, Patent Airway and No signs of Nausea or vomiting  Last Vitals:  Filed Vitals:   07/22/12 0743  BP:   Pulse:   Temp:   Resp: 16    Post-op Vital Signs: stable   Complications: No apparent anesthesia complications

## 2012-07-22 NOTE — Progress Notes (Signed)
1 Day Post-Op  Subjective: Sore abdomen distended feels like she's full of gas.  No nausea with clears, pain is better, but still hurts even to do IS.  Objective: Vital signs in last 24 hours: Temp:  [97.7 F (36.5 C)-98.7 F (37.1 C)] 97.9 F (36.6 C) (01/22 0539) Pulse Rate:  [72-90] 78  (01/22 0539) Resp:  [8-20] 16  (01/22 0743) BP: (92-141)/(41-106) 110/70 mmHg (01/22 0539) SpO2:  [92 %-100 %] 99 % (01/22 0743) Last BM Date: 07/20/12  840 PO recorded, afebrile, VSS, labs OK,  Intake/Output from previous day: 01/21 0701 - 01/22 0700 In: 4129.5 [P.O.:840; I.V.:3289.5] Out: 2195 [Urine:1900; Drains:95; Blood:200] Intake/Output this shift:    General appearance: alert, cooperative and no distress Resp: clear to auscultation bilaterally GI: soft, tender, few BS, incisions dressings intact.  Lab Results:   Basename 07/22/12 0422 07/21/12 0415  WBC 9.5 9.8  HGB 12.1 12.8  HCT 36.1 38.6  PLT 172 155    BMET  Basename 07/22/12 0422 07/21/12 0415  NA 132* 130*  K 4.6 3.7  CL 96 94*  CO2 28 29  GLUCOSE 127* 122*  BUN 6 7  CREATININE 0.48* 0.58  CALCIUM 8.7 8.3*   PT/INR No results found for this basename: LABPROT:2,INR:2 in the last 72 hours   Lab 07/22/12 0422 07/21/12 0415 07/20/12 1229  AST 59* 22 16  ALT 81* 23 22  ALKPHOS 52 48 55  BILITOT 0.3 0.6 0.6  PROT 6.4 6.4 7.5  ALBUMIN 2.6* 2.9* 3.6     Lipase     Component Value Date/Time   LIPASE 11 07/21/2012 0415     Studies/Results: Dg Chest 2 View  07/20/2012  *RADIOLOGY REPORT*  Clinical Data: Chest, right upper quadrant pain.  CHEST - 2 VIEW  Comparison: 06/16/2012  Findings: Heart and mediastinal contours are within normal limits. No focal opacities or effusions.  No acute bony abnormality. Degenerative changes in the thoracic spine  IMPRESSION: No acute cardiopulmonary disease.   Original Report Authenticated By: Charlett Nose, M.D.    Dg Abd 1 View  07/20/2012  *RADIOLOGY REPORT*  Clinical  Data: Right upper quadrant pain.  ABDOMEN - 1 VIEW  Comparison: 07/17/2012  Findings: Moderate stool throughout the colon. There is a nonobstructive bowel gas pattern.  No supine evidence of free air. No organomegaly or suspicious calcification.  No acute bony abnormality.  IMPRESSION: Moderate stool burden.  No acute findings.   Original Report Authenticated By: Charlett Nose, M.D.    US Abdomen Complete  07/20/2012  *RADIOLOGY REPORT*  Clinical Data:  Abdominal and back pain.  COMPLETE ABDOMINAL ULTRASOUND  Comparison:  CT abdomen pelvis 02/21/2012.  Findings:  Gallbladder:  Scattered echogenic stones measure up to 1.2 cm. There is nonshadowing echogenic sludge as well.  Gallbladder wall measures 4 mm.  Trace pericholecystic fluid.  Negative for sonographic Murphy's sign.  Common bile duct:  Measures 3 mm, within normal limits.  Liver:  Diffusely increased in echogenicity.  IVC:  Appears normal.  Pancreas:  No focal abnormality seen.  Spleen:  Measures 10.6 cm, negative.  Right Kidney:  Measures 13.2 cm.  Parenchymal echogenicity is normal but there is cortical thinning.  No hydronephrosis.  No focal lesion.  Left Kidney:  Measures 14.1 cm.  Parenchymal echogenicity is normal but there is cortical thinning.  No hydronephrosis.  No focal lesion.  Abdominal aorta:  Proximal abdominal aorta is obscured by bowel gas.  Otherwise, no aneurysm.  IMPRESSION:  1. Cholelithiasis and  sludge, with gallbladder wall thickening and trace pericholecystic fluid.  In the absence of a positive sonographic Murphy's sign, chronic cholecystitis is considered. 2.  Hepatic steatosis.   Original Report Authenticated By: Leanna Battles, M.D.     Medications:    . ciprofloxacin  400 mg Intravenous BID  . doxycycline  100 mg Oral Q12H  . fluticasone  2 spray Each Nare Daily  . heparin  5,000 Units Subcutaneous Q8H  . morphine   Intravenous Q4H  . pantoprazole  40 mg Oral Daily    Assessment/Plan Acute on chronic  cholecystitis; Attempted laparoscopic cholecystectomy converted to open cholecystectomy, 07/21/2012, Emelia Loron, MD.  2. History of hypothyroidism currently not on supplement  3. 20 year history of tobacco use none since 1996.  4. History of diverticulosis  5. Nephrolithiasis with hematuria   Plan:  Mobilize, stay on clears for now, see how she does, IS, continue PCA.        LOS: 2 days    Tiffany Glover 07/22/2012

## 2012-07-22 NOTE — Telephone Encounter (Signed)
Called patient to schedule Post/OP appt did not leave message on voicemail due to no name given I  will call back.

## 2012-07-23 ENCOUNTER — Telehealth (INDEPENDENT_AMBULATORY_CARE_PROVIDER_SITE_OTHER): Payer: Self-pay | Admitting: General Surgery

## 2012-07-23 MED ORDER — SIMETHICONE 80 MG PO CHEW
80.0000 mg | CHEWABLE_TABLET | Freq: Four times a day (QID) | ORAL | Status: DC
  Administered 2012-07-23 – 2012-07-25 (×7): 80 mg via ORAL
  Filled 2012-07-23 (×10): qty 1

## 2012-07-23 MED ORDER — OXYCODONE HCL 5 MG PO TABS
5.0000 mg | ORAL_TABLET | ORAL | Status: DC | PRN
  Administered 2012-07-23 – 2012-07-25 (×3): 10 mg via ORAL
  Filled 2012-07-23 (×3): qty 2

## 2012-07-23 MED FILL — Bupivacaine HCl Preservative Free (PF) Inj 0.25%: INTRAMUSCULAR | Qty: 30 | Status: AC

## 2012-07-23 NOTE — Telephone Encounter (Signed)
Spoke with Lonnetta Kniskern  This patient is still in the hospital will not be discharged until 1/25-1/26/14 I will call back on  Monday 07/27/12 for post op f/u

## 2012-07-23 NOTE — Progress Notes (Signed)
2 Days Post-Op  Subjective: Feels pretty sore, did well with clears and had some grits this AM.  No nausea, no flatus so far.  Objective: Vital signs in last 24 hours: Temp:  [97.6 F (36.4 C)-98.4 F (36.9 C)] 98 F (36.7 C) (01/23 0604) Pulse Rate:  [65-91] 70  (01/23 0604) Resp:  [14-20] 17  (01/23 0744) BP: (100-135)/(57-88) 135/80 mmHg (01/23 0604) SpO2:  [95 %-99 %] 98 % (01/23 0744) Last BM Date: 07/20/12  1720 PO recorded, afebrile, labs OK, albumin down,   Intake/Output from previous day: 01/22 0701 - 01/23 0700 In: 4033.3 [P.O.:1720; I.V.:2313.3] Out: 2740 [Urine:2650; Drains:90] Intake/Output this shift:    General appearance: alert, cooperative, no distress and she is very sore this AM Resp: clear to auscultation bilaterally GI: soft, tender, few BS, no flatus incisions all look good.  Lab Results:   Basename 07/22/12 0422 07/21/12 0415  WBC 9.5 9.8  HGB 12.1 12.8  HCT 36.1 38.6  PLT 172 155    BMET  Basename 07/22/12 0422 07/21/12 0415  NA 132* 130*  K 4.6 3.7  CL 96 94*  CO2 28 29  GLUCOSE 127* 122*  BUN 6 7  CREATININE 0.48* 0.58  CALCIUM 8.7 8.3*   PT/INR No results found for this basename: LABPROT:2,INR:2 in the last 72 hours   Lab 07/22/12 0422 07/21/12 0415 07/20/12 1229  AST 59* 22 16  ALT 81* 23 22  ALKPHOS 52 48 55  BILITOT 0.3 0.6 0.6  PROT 6.4 6.4 7.5  ALBUMIN 2.6* 2.9* 3.6     Lipase     Component Value Date/Time   LIPASE 11 07/21/2012 0415     Studies/Results: No results found.  Medications:    . ciprofloxacin  400 mg Intravenous BID  . doxycycline  100 mg Oral Q12H  . fluticasone  2 spray Each Nare Daily  . heparin  5,000 Units Subcutaneous Q8H  . morphine   Intravenous Q4H  . pantoprazole  40 mg Oral Daily    Assessment/Plan Acute on chronic cholecystitis; Attempted laparoscopic cholecystectomy converted to open cholecystectomy, 07/21/2012, Emelia Loron, MD.  2. History of hypothyroidism currently not  on supplement  3. 20 year history of tobacco use none since 1996.  4. History of diverticulosis  5. Nephrolithiasis with hematuria   Plan:  I have started her on fulls, continue to mobilize, start some PO pain meds, and hope to take her off PCA tomorrow.        LOS: 3 days    Dorell Gatlin 07/23/2012

## 2012-07-24 MED ORDER — CIPROFLOXACIN HCL 500 MG PO TABS
500.0000 mg | ORAL_TABLET | Freq: Two times a day (BID) | ORAL | Status: DC
  Administered 2012-07-24 – 2012-07-25 (×3): 500 mg via ORAL
  Filled 2012-07-24 (×6): qty 1

## 2012-07-24 MED ORDER — DOCUSATE SODIUM 100 MG PO CAPS
100.0000 mg | ORAL_CAPSULE | Freq: Two times a day (BID) | ORAL | Status: DC
  Administered 2012-07-24 – 2012-07-25 (×3): 100 mg via ORAL
  Filled 2012-07-24 (×4): qty 1

## 2012-07-24 MED ORDER — MORPHINE SULFATE 2 MG/ML IJ SOLN: 2.0000 mg | INTRAMUSCULAR | Status: DC | PRN

## 2012-07-24 NOTE — Progress Notes (Signed)
IV out and they cannot get another one in, I have ask them to call anesthesia.  I will change her cipro to PO, and see if we can get by without PICC. She is starting regular diet today.

## 2012-07-24 NOTE — Progress Notes (Signed)
3 Days Post-Op  Subjective: Passing some flatus, tol fulls,wants to try regular diet  Objective: Vital signs in last 24 hours: Temp:  [97.6 F (36.4 C)-98 F (36.7 C)] 97.8 F (36.6 C) (01/24 0447) Pulse Rate:  [71-85] 85  (01/24 0447) Resp:  [14-20] 20  (01/24 0447) BP: (130-140)/(70-85) 140/79 mmHg (01/24 0447) SpO2:  [97 %-99 %] 98 % (01/24 0447) Last BM Date: 07/20/12  Intake/Output from previous day: 01/23 0701 - 01/24 0700 In: 3731.7 [P.O.:1440; I.V.:2291.7] Out: 3930 [Urine:3800; Drains:130] Intake/Output this shift:    General appearance: no distress Resp: clear to auscultation bilaterally Cardio: regular rate and rhythm GI: soft, approp tender, bs present, wounds clean, drain serous  Lab Results:   [facility] 07/22/12 0422  WBC 9.5  HGB 12.1  HCT 36.1  PLT 172   BMET  Basename 07/22/12 0422  NA 132*  K 4.6  CL 96  CO2 28  GLUCOSE 127*  BUN 6  CREATININE 0.48*  CALCIUM 8.7    Anti-infectives: Anti-infectives     Start     Dose/Rate Route Frequency Ordered Stop   07/20/12 1700   ciprofloxacin (CIPRO) IVPB 400 mg        400 mg 200 mL/hr over 60 Minutes Intravenous 2 times daily 07/20/12 1605     07/20/12 1700   doxycycline (VIBRA-TABS) tablet 100 mg        100 mg Oral Every 12 hours 07/20/12 1622            Assessment/Plan: POD 3 open chole 1. Po pain meds with iv backup, toradol prn 2. pulm toilet 3. Advance diet, ileus slowly resolving 4. Sq heparin, scds 5. Cont abx for 7 day course due to gangrenous gb 6. Will keep drain in for now, can likely be removed prior to dc if remains serous 7. Hopefully home over weekend 8. Will need staples out around pod 7  [facility] 07/24/2012

## 2012-07-25 ENCOUNTER — Encounter (HOSPITAL_COMMUNITY): Payer: Self-pay | Admitting: Surgery

## 2012-07-25 DIAGNOSIS — K812 Acute cholecystitis with chronic cholecystitis: Secondary | ICD-10-CM | POA: Diagnosis present

## 2012-07-25 DIAGNOSIS — E669 Obesity, unspecified: Secondary | ICD-10-CM | POA: Diagnosis present

## 2012-07-25 MED ORDER — SODIUM CHLORIDE 0.9 % IJ SOLN: 3.0000 mL | INTRAMUSCULAR | Status: DC | PRN

## 2012-07-25 MED ORDER — SODIUM CHLORIDE 0.9 % IJ SOLN: 3.0000 mL | Freq: Two times a day (BID) | INTRAMUSCULAR | Status: DC

## 2012-07-25 MED ORDER — CIPROFLOXACIN HCL 500 MG PO TABS: 500.0000 mg | ORAL_TABLET | Freq: Two times a day (BID) | ORAL | Status: DC

## 2012-07-25 MED ORDER — OXYCODONE HCL 5 MG PO TABS: 5.0000 mg | ORAL_TABLET | Freq: Four times a day (QID) | ORAL | Status: DC | PRN

## 2012-07-25 NOTE — Discharge Summary (Signed)
Physician Discharge Summary  Patient ID: Tiffany Glover MRN: [id number] DOB/AGE: [birth year] 52 y.o.  Admit date: 07/20/2012 Discharge date: 07/25/2012  Admission Diagnoses: Principal Problem:  *Acute cholecystitis with chronic cholecystitis Active Problems:  HYPOTHYROIDISM  Obesity (BMI 30-39.9)  Discharge Diagnoses:  Principal Problem:  *Acute cholecystitis with chronic cholecystitis Active Problems:  HYPOTHYROIDISM  Obesity (BMI 30-39.9)   Discharged Condition: good  Hospital Course: Obese patient with chronic biliary colic now constant.  Admitted for cholecystitis.  Placed on antibiotics.  Had laparoscopic lead attempted cholecystectomy.  Had to convert to open.  Drain left.  Postoperatively, the patient mobilized and advanced to a solid diet gradually.  Pain was well-controlled and transitioned off IV medications.  Drainage output was serosanguineous with no bile.  Low output.  It was removed on the day of surgery. By the time of discharge, the patient was walking well the hallways, eating food well, having flatus.  Pain was-controlled on an oral regimen.  Based on meeting DC criteria and recovering well, I felt it was safe for the patient to be discharged home with close followup.  Instructions were discussed in detail.  They are written as well.     Consults: None  Significant Diagnostic Studies:   Dg Chest 2 View  07/20/2012  *RADIOLOGY REPORT*  Clinical Data: Chest, right upper quadrant pain.  CHEST - 2 VIEW  Comparison: 06/16/2012  Findings: Heart and mediastinal contours are within normal limits. No focal opacities or effusions.  No acute bony abnormality. Degenerative changes in the thoracic spine  IMPRESSION: No acute cardiopulmonary disease.   Original Report Authenticated By: Charlett Nose, M.D.    Dg Abd 1 View  07/20/2012  *RADIOLOGY REPORT*  Clinical Data: Right upper quadrant pain.  ABDOMEN - 1 VIEW  Comparison: 07/17/2012  Findings: Moderate stool throughout the  colon. There is a nonobstructive bowel gas pattern.  No supine evidence of free air. No organomegaly or suspicious calcification.  No acute bony abnormality.  IMPRESSION: Moderate stool burden.  No acute findings.   Original Report Authenticated By: Charlett Nose, M.D.    US Abdomen Complete  07/20/2012  *RADIOLOGY REPORT*  Clinical Data:  Abdominal and back pain.  COMPLETE ABDOMINAL ULTRASOUND  Comparison:  CT abdomen pelvis 02/21/2012.  Findings:  Gallbladder:  Scattered echogenic stones measure up to 1.2 cm. There is nonshadowing echogenic sludge as well.  Gallbladder wall measures 4 mm.  Trace pericholecystic fluid.  Negative for sonographic Murphy's sign.  Common bile duct:  Measures 3 mm, within normal limits.  Liver:  Diffusely increased in echogenicity.  IVC:  Appears normal.  Pancreas:  No focal abnormality seen.  Spleen:  Measures 10.6 cm, negative.  Right Kidney:  Measures 13.2 cm.  Parenchymal echogenicity is normal but there is cortical thinning.  No hydronephrosis.  No focal lesion.  Left Kidney:  Measures 14.1 cm.  Parenchymal echogenicity is normal but there is cortical thinning.  No hydronephrosis.  No focal lesion.  Abdominal aorta:  Proximal abdominal aorta is obscured by bowel gas.  Otherwise, no aneurysm.  IMPRESSION:  1. Cholelithiasis and sludge, with gallbladder wall thickening and trace pericholecystic fluid.  In the absence of a positive sonographic Murphy's sign, chronic cholecystitis is considered. 2.  Hepatic steatosis.   Original Report Authenticated By: Leanna Battles, M.D.    Treatments: surgery: Lap converted to open cholecystectomy  Discharge Exam: Blood pressure 129/91, pulse 71, temperature 98.8 F (37.1 C), temperature source Oral, resp. rate 18, height 5\' 5"  (1.651 m), weight  260 lb 9.6 oz (118.207 kg), last menstrual period 07/20/2012, SpO2 94.00%.  General: Pt awake/alert/oriented x4 in no major acute distress Eyes: PERRL, normal EOM. Sclera nonicteric Neuro: CN  II-XII intact w/o focal sensory/motor deficits. Lymph: No head/neck/groin lymphadenopathy Psych:  No delerium/psychosis/paranoia HENT: Normocephalic, Mucus membranes moist.  No thrush Neck: Supple, No tracheal deviation Chest: No pain.  Good respiratory excursion. CV:  Pulses intact.  Regular rhythm MS: Normal AROM mjr joints.  No obvious deformity Abdomen: Soft, Obese Nondistended.  Mildly tender at incisions only. No cellulitis.  Drain scant serosanguinous output.   No incarcerated hernias. Ext:  SCDs BLE.  No significant edema.  No cyanosis Skin: No petechiae / purpurae   Disposition: 06-Home-Health Care Svc  Discharge Orders    Future Orders Please Complete By Expires   Diet - low sodium heart healthy      Increase activity slowly          Medication List     As of 07/25/2012  9:29 AM    TAKE these medications         chlorpheniramine-HYDROcodone 10-8 MG/5ML Lqcr   Commonly known as: TUSSIONEX   Take 5 mLs by mouth every 12 (twelve) hours as needed.      ciprofloxacin 500 MG tablet   Commonly known as: CIPRO   Take 1 tablet (500 mg total) by mouth 2 (two) times daily.      doxycycline 100 MG tablet   Commonly known as: VIBRA-TABS   Take 1 tablet (100 mg total) by mouth 2 (two) times daily.      fluticasone 50 MCG/ACT nasal spray   Commonly known as: FLONASE   Place 2 sprays into the nose daily.      HYDROcodone-acetaminophen 5-325 MG per tablet   Commonly known as: NORCO/VICODIN   Take 1 tablet by mouth every 6 (six) hours as needed for pain.      multivitamin tablet   Take 1 tablet by mouth daily.      naproxen sodium 220 MG tablet   Commonly known as: ANAPROX   Take 220 mg by mouth 2 (two) times daily with a meal.      omeprazole 40 MG capsule   Commonly known as: PRILOSEC   Take 40 mg by mouth daily.      oxyCODONE 5 MG immediate release tablet   Commonly known as: Oxy IR/ROXICODONE   Take 1-3 tablets (5-15 mg total) by mouth every 6 (six) hours as  needed for pain.      Tamsulosin HCl 0.4 MG Caps   Commonly known as: FLOMAX   Take one capsule per day to facilitate stone passage.           Follow-up Information    Follow up with [facility], [place]. Schedule an appointment as soon as possible for a visit in 10 days. (Come back to get skin staples removed)    Contact information:   [address] [place] [address] [phone]          Signed: Ardeth Sportsman. 07/25/2012, 9:29 AM

## 2012-07-25 NOTE — Progress Notes (Signed)
Patient discharged via wheelchair. IV and jp drain removed. rx for oxycodone given. Patient states understanding of discharge instructions, states no need to speak with case manager

## 2012-07-27 ENCOUNTER — Telehealth (INDEPENDENT_AMBULATORY_CARE_PROVIDER_SITE_OTHER): Payer: Self-pay

## 2012-07-27 ENCOUNTER — Telehealth (INDEPENDENT_AMBULATORY_CARE_PROVIDER_SITE_OTHER): Payer: Self-pay | Admitting: General Surgery

## 2012-07-27 NOTE — Telephone Encounter (Signed)
The pt had her gb removed on 1/21.  She has staples.  She was discharged on Saturday 07/25/2012.  She was told to follow up in 10 days.  I booked her for 2/6.  Will you check to see if it is supposed to be 10 days from surgery or discharge.  The appointment may need to be moved up.

## 2012-07-27 NOTE — Telephone Encounter (Signed)
Spoke with patient spouse , patient was on her cell phone making f/u po appt .

## 2012-07-28 ENCOUNTER — Telehealth (INDEPENDENT_AMBULATORY_CARE_PROVIDER_SITE_OTHER): Payer: Self-pay

## 2012-07-28 NOTE — Telephone Encounter (Signed)
Called pt to make her f/u appt with Dr Dwain Sarna.

## 2012-07-30 ENCOUNTER — Encounter (INDEPENDENT_AMBULATORY_CARE_PROVIDER_SITE_OTHER): Payer: Self-pay | Admitting: General Surgery

## 2012-07-30 ENCOUNTER — Ambulatory Visit (INDEPENDENT_AMBULATORY_CARE_PROVIDER_SITE_OTHER): Payer: BC Managed Care – PPO | Admitting: General Surgery

## 2012-07-30 VITALS — BP 128/82 | HR 74 | Resp 16 | Ht 64.5 in | Wt 260.0 lb

## 2012-07-30 DIAGNOSIS — Z09 Encounter for follow-up examination after completed treatment for conditions other than malignant neoplasm: Secondary | ICD-10-CM

## 2012-07-30 NOTE — Progress Notes (Signed)
Subjective:     Patient ID: Tiffany Glover, female   DOB: 1961/03/29, 52 y.o.   MRN: [id number]  HPI 46 yof s/p open chole last week.  She is doing well.  Eating is improving, she is having a bm about an hour after eating now.  She is up and moving around fine.  Pain well controlled.  We discussed path today.  Review of Systems     Objective:   Physical Exam Well healed lap incisions and ruq incision without infection    Assessment:     S/p open chole    Plan:     I removed her staples today and applied steristrips.  I told her to increase activity but no lifting until next visit.  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

## 2012-08-06 ENCOUNTER — Encounter (INDEPENDENT_AMBULATORY_CARE_PROVIDER_SITE_OTHER): Payer: BC Managed Care – PPO | Admitting: General Surgery

## 2012-08-25 ENCOUNTER — Encounter (INDEPENDENT_AMBULATORY_CARE_PROVIDER_SITE_OTHER): Payer: Self-pay | Admitting: General Surgery

## 2012-08-25 ENCOUNTER — Ambulatory Visit (INDEPENDENT_AMBULATORY_CARE_PROVIDER_SITE_OTHER): Payer: BC Managed Care – PPO | Admitting: General Surgery

## 2012-08-25 ENCOUNTER — Encounter (INDEPENDENT_AMBULATORY_CARE_PROVIDER_SITE_OTHER): Payer: Self-pay

## 2012-08-25 VITALS — BP 122/72 | HR 84 | Resp 18 | Ht 64.5 in | Wt 263.0 lb

## 2012-08-25 DIAGNOSIS — Z09 Encounter for follow-up examination after completed treatment for conditions other than malignant neoplasm: Secondary | ICD-10-CM

## 2012-08-25 LAB — BASIC METABOLIC PANEL
Calcium: 9.3 mg/dL (ref 8.4–10.5)
Glucose, Bld: 105 mg/dL — ABNORMAL HIGH (ref 70–99)
Sodium: 139 mEq/L (ref 135–145)

## 2012-08-25 LAB — MAGNESIUM: Magnesium: 1.6 mg/dL (ref 1.5–2.5)

## 2012-08-25 NOTE — Progress Notes (Signed)
Subjective:     Patient ID: Tiffany Glover, female   DOB: 1961-02-17, 52 y.o.   MRN: [id number]  HPI This is a 52 year old female who underwent an open cholecystectomy in an urgent fashion. This was done about a month ago now. I took her staples out her last visit. She returns today doing fairly well. She has a couple of complaints. She is having some loose stools daily. This occurs with any food and she's not eating fried or fatty foods. She also after her given her husband amassage noted some burning at the lateral portion of the incision. She had no bulge or anything else associated with this pain. She works as a Teacher, adult education and was trying to see if  she was able to go back to work now. She also complains of some cramping in her feet and her hands. She was noted to have a little bit of hypokalemia and the hospital. Otherwise she is feeling better than she did preoperatively in terms of her abdominal pain.  Review of Systems     Objective:   Physical Exam Abdomen soft, mild tenderness at lateral portion of incision, no bulge, no hernia, no infection    Assessment:     S/p open chole for cholecystitis    Plan:     Think most of these are normal complaints. I did recommend her to try some Imodium initially. Hopefully the issues with her diarrhea would then resolve over the next several weeks. I'm going to check her electrolytes today as well due to the cramping that she is having. I told her I think that the pain at the burning at the site of her incision will resolve over time I don't think there is anything more than just normal postoperative pain going on right now. She willsee me in about 3-4 weeks.

## 2012-08-26 ENCOUNTER — Telehealth (INDEPENDENT_AMBULATORY_CARE_PROVIDER_SITE_OTHER): Payer: Self-pay

## 2012-08-26 NOTE — Telephone Encounter (Signed)
Patient called back and was given lab results

## 2012-08-26 NOTE — Telephone Encounter (Signed)
LM for pt to call me b/c I want to let her know all her labs were normal from yesterday per Dr Dwain Sarna.

## 2012-09-15 ENCOUNTER — Encounter (INDEPENDENT_AMBULATORY_CARE_PROVIDER_SITE_OTHER): Payer: Self-pay | Admitting: General Surgery

## 2012-09-15 ENCOUNTER — Ambulatory Visit (INDEPENDENT_AMBULATORY_CARE_PROVIDER_SITE_OTHER): Payer: BC Managed Care – PPO | Admitting: General Surgery

## 2012-09-15 ENCOUNTER — Encounter (INDEPENDENT_AMBULATORY_CARE_PROVIDER_SITE_OTHER): Payer: Self-pay

## 2012-09-15 VITALS — BP 122/74 | HR 84 | Resp 18 | Ht 64.5 in | Wt 263.0 lb

## 2012-09-15 DIAGNOSIS — Z09 Encounter for follow-up examination after completed treatment for conditions other than malignant neoplasm: Secondary | ICD-10-CM

## 2012-09-15 NOTE — Progress Notes (Signed)
Subjective:     Patient ID: Tiffany Glover, female   DOB: 08/04/1960, 52 y.o.   MRN: [id number]  HPI This is a 52 year old female who underwent an open cholecystectomy in an urgent fashion.  She returns today doing fairly well and better than our last visit.  She is having still having some loose stools daily. This is better with immodium in the am but occurs later in day still.This occurs with any food and she's not eating fried or fatty foods. She had some pain and burning at lateral portion of incision which is now resolved. She now has some pain that is worse with activity in her epigastrium that feels like a pulling pain.  This is better with rest.She works as a Teacher, adult education and was trying to see if she was able to go back to work now.    Review of Systems     Objective:   Physical Exam    healed incision without infection, no palpable hernia, no real abdominal pain on exam today Assessment:     S/p cholecystectomy     Plan:     I recommended another immodium dose in afternoon and following this for now. I think she has some musculoskeletal incisional pain and no further eval needed for right now. I will see her back in four weeks Will release to limited duty at work now

## 2012-09-23 ENCOUNTER — Telehealth (INDEPENDENT_AMBULATORY_CARE_PROVIDER_SITE_OTHER): Payer: Self-pay | Admitting: General Surgery

## 2012-09-23 NOTE — Telephone Encounter (Signed)
Pt called to report she has received a letter from her insurer stating "her records indicate she is in early stages of liver problems, and encouraged her to get the Hep A vaccine."  She states she had bloodwork in late February, but no one said anything about her liver.  Reviewed the BMET results and they are all grossly normal.  Recommended Hep A vaccine, regardless, and discussed how it is acquired, etc.  She will call her insurer and ask about the letter.

## 2012-10-23 ENCOUNTER — Ambulatory Visit (INDEPENDENT_AMBULATORY_CARE_PROVIDER_SITE_OTHER): Payer: BC Managed Care – PPO | Admitting: General Surgery

## 2012-10-23 ENCOUNTER — Encounter (INDEPENDENT_AMBULATORY_CARE_PROVIDER_SITE_OTHER): Payer: Self-pay

## 2012-10-23 ENCOUNTER — Encounter (INDEPENDENT_AMBULATORY_CARE_PROVIDER_SITE_OTHER): Payer: Self-pay | Admitting: General Surgery

## 2012-10-23 VITALS — BP 132/82 | HR 84 | Resp 18 | Ht 64.0 in | Wt 264.0 lb

## 2012-10-23 DIAGNOSIS — Z09 Encounter for follow-up examination after completed treatment for conditions other than malignant neoplasm: Secondary | ICD-10-CM

## 2012-10-25 NOTE — Progress Notes (Signed)
Subjective:     Patient ID: Tiffany Glover, female   DOB: 1960/11/22, 52 y.o.   MRN: [id number]  HPI 52 yof s/p open chole for acute cholecystitis with slow recovery.  She had some diarrhea but this is improved.  She feels back to her normal self but better due to no more gb symptoms. She would like to return full time to work. She is eating well and really has no more complaints.  Review of Systems     Objective:   Physical Exam Well healed ruq incision, no hernia no tenderness    Assessment:     S/p open chole    Plan:     She is cleared for normal activity and I will see back as needed.  We returned her full time to work today

## 2013-03-30 ENCOUNTER — Encounter (INDEPENDENT_AMBULATORY_CARE_PROVIDER_SITE_OTHER): Payer: Self-pay

## 2013-06-25 ENCOUNTER — Ambulatory Visit: Payer: BC Managed Care – PPO

## 2013-06-25 ENCOUNTER — Ambulatory Visit (INDEPENDENT_AMBULATORY_CARE_PROVIDER_SITE_OTHER): Payer: BC Managed Care – PPO | Admitting: Emergency Medicine

## 2013-06-25 VITALS — BP 118/80 | HR 92 | Temp 98.2°F | Resp 16 | Ht 64.0 in | Wt 249.0 lb

## 2013-06-25 DIAGNOSIS — R1032 Left lower quadrant pain: Secondary | ICD-10-CM

## 2013-06-25 DIAGNOSIS — K5732 Diverticulitis of large intestine without perforation or abscess without bleeding: Secondary | ICD-10-CM

## 2013-06-25 LAB — POCT UA - MICROSCOPIC ONLY

## 2013-06-25 LAB — POCT CBC
Granulocyte percent: 75.2 %G (ref 37–80)
HCT, POC: 44 % (ref 37.7–47.9)
Hemoglobin: 13.9 g/dL (ref 12.2–16.2)
Lymph, poc: 1.6 (ref 0.6–3.4)
MCV: 92.4 fL (ref 80–97)
POC LYMPH PERCENT: 18.5 %L (ref 10–50)
RDW, POC: 14.3 %

## 2013-06-25 LAB — POCT URINALYSIS DIPSTICK
Bilirubin, UA: NEGATIVE
Glucose, UA: NEGATIVE
Ketones, UA: NEGATIVE
Protein, UA: NEGATIVE
Spec Grav, UA: 1.015

## 2013-06-25 MED ORDER — METRONIDAZOLE 500 MG PO TABS: 500.0000 mg | ORAL_TABLET | Freq: Four times a day (QID) | ORAL | Status: DC

## 2013-06-25 MED ORDER — CIPROFLOXACIN HCL 500 MG PO TABS: 500.0000 mg | ORAL_TABLET | Freq: Two times a day (BID) | ORAL | Status: DC

## 2013-06-25 NOTE — Patient Instructions (Signed)
Diverticulitis °A diverticulum is a small pouch or sac on the colon. Diverticulosis is the presence of these diverticula on the colon. Diverticulitis is the irritation (inflammation) or infection of diverticula. °CAUSES  °The colon and its diverticula contain bacteria. If food particles block the tiny opening to a diverticulum, the bacteria inside can grow and cause an increase in pressure. This leads to infection and inflammation and is called diverticulitis. °SYMPTOMS  °· Abdominal pain and tenderness. Usually, the pain is located on the left side of your abdomen. However, it could be located elsewhere. °· Fever. °· Bloating. °· Feeling sick to your stomach (nausea). °· Throwing up (vomiting). °· Abnormal stools. °DIAGNOSIS  °Your caregiver will take a history and perform a physical exam. Since many things can cause abdominal pain, other tests may be necessary. Tests may include: °· Blood tests. °· Urine tests. °· X-ray of the abdomen. °· CT scan of the abdomen. °Sometimes, surgery is needed to determine if diverticulitis or other conditions are causing your symptoms. °TREATMENT  °Most of the time, you can be treated without surgery. Treatment includes: °· Resting the bowels by only having liquids for a few days. As you improve, you will need to eat a low-fiber diet. °· Intravenous (IV) fluids if you are losing body fluids (dehydrated). °· Antibiotic medicines that treat infections may be given. °· Pain and nausea medicine, if needed. °· Surgery if the inflamed diverticulum has burst. °HOME CARE INSTRUCTIONS  °· Try a clear liquid diet (broth, tea, or water for as long as directed by your caregiver). You may then gradually begin a low-fiber diet as tolerated.  °A low-fiber diet is a diet with less than 10 grams of fiber. Choose the foods below to reduce fiber in the diet: °· White breads, cereals, rice, and pasta. °· Cooked fruits and vegetables or soft fresh fruits and vegetables without the skin. °· Ground or  well-cooked tender beef, ham, veal, lamb, pork, or poultry. °· Eggs and seafood. °· After your diverticulitis symptoms have improved, your caregiver may put you on a high-fiber diet. A high-fiber diet includes 14 grams of fiber for every 1000 calories consumed. For a standard 2000 calorie diet, you would need 28 grams of fiber. Follow these diet guidelines to help you increase the fiber in your diet. It is important to slowly increase the amount fiber in your diet to avoid gas, constipation, and bloating. °· Choose whole-grain breads, cereals, pasta, and brown rice. °· Choose fresh fruits and vegetables with the skin on. Do not overcook vegetables because the more vegetables are cooked, the more fiber is lost. °· Choose more nuts, seeds, legumes, dried peas, beans, and lentils. °· Look for food products that have greater than 3 grams of fiber per serving on the Nutrition Facts label. °· Take all medicine as directed by your caregiver. °· If your caregiver has given you a follow-up appointment, it is very important that you go. Not going could result in lasting (chronic) or permanent injury, pain, and disability. If there is any problem keeping the appointment, call to reschedule. °SEEK MEDICAL CARE IF:  °· Your pain does not improve. °· You have a hard time advancing your diet beyond clear liquids. °· Your bowel movements do not return to normal. °SEEK IMMEDIATE MEDICAL CARE IF:  °· Your pain becomes worse. °· You have an oral temperature above 102° F (38.9° C), not controlled by medicine. °· You have repeated vomiting. °· You have bloody or black, tarry stools. °·   Symptoms that brought you to your caregiver become worse or are not getting better. °MAKE SURE YOU:  °· Understand these instructions. °· Will watch your condition. °· Will get help right away if you are not doing well or get worse. °Document Released: 03/27/2005 Document Revised: 09/09/2011 Document Reviewed: 07/23/2010 °[employer]® Patient Information  ©2014 [employer]. ° °

## 2013-06-25 NOTE — Progress Notes (Signed)
[facility] [address], [place] [address] [phone]  Date:  06/25/2013   Name:  Tiffany Glover   DOB:  1960/09/01   MRN:  [id number]  PCP:  Pcp Not In System    Chief Complaint: Flank Pain   History of Present Illness:  Tiffany Glover is a 52 y.o. very pleasant female patient who presents with the following:  Has chronic LLQ intermittent pain.  Frequent (5 times daily) loose explosive stools following cholecystectomy.  Found on colonoscopy to have diverticulosis.  No blood in stools or black stools.  Has sudden increase in LLQ pain yesterday that nearly drove her to the ER.   Pain is colicky in nature.  No fever or chills.  No GI, GU or GYN symptoms.  No improvement with over the counter medications or other home remedies. Denies other complaint or health concern today.   Patient Active Problem List   Diagnosis Date Noted  . Obesity (BMI 30-39.9) 07/25/2012  . HYPOTHYROIDISM 09/04/2007  . INTERNAL HEMORRHOIDS 09/04/2007  . EXTERNAL HEMORRHOIDS 09/04/2007  . DIVERTICULOSIS OF COLON 09/04/2007    Past Medical History  Diagnosis Date  . Hypothyroidism   . Complication of anesthesia     hard to wake up once  . Obesity (BMI 30-39.9) 07/25/2012    Past Surgical History  Procedure Laterality Date  . Cesarean section    . Cesarean section    . Foot surgery      ganglion cyst removed from right foot  . Vein strippiing      left leg  . Cholecystectomy  07/21/2012    Procedure: LAPAROSCOPIC CHOLECYSTECTOMY WITH INTRAOPERATIVE CHOLANGIOGRAM;  Surgeon: Emelia Loron, MD;  Location: [facility];  Service: General;  Laterality: N/A;  attempted  . Cholecystectomy  07/21/2012    Procedure: CHOLECYSTECTOMY;  Surgeon: Emelia Loron, MD;  Location: [facility];  Service: General;  Laterality: N/A;    History  Substance Use Topics  . Smoking status: Former Smoker -- 1.50 packs/day for 20 years    Quit date: 07/20/1994  . Smokeless tobacco: Never Used  .  Alcohol Use: Yes     Comment: Occassional Use    Family History  Problem Relation Age of Onset  . Urolithiasis Son   . Cancer Mother   . Cancer Brother   . Cancer Maternal Grandmother     No Known Allergies  Medication list has been reviewed and updated.  Current Outpatient Prescriptions on File Prior to Visit  Medication Sig Dispense Refill  . Multiple Vitamin (MULTIVITAMIN) tablet Take 1 tablet by mouth daily.      . chlorpheniramine-HYDROcodone (TUSSIONEX PENNKINETIC ER) 10-8 MG/5ML LQCR Take 5 mLs by mouth every 12 (twelve) hours as needed.  90 mL  0  . fluticasone (FLONASE) 50 MCG/ACT nasal spray Place 2 sprays into the nose daily.  16 g  6  . omeprazole (PRILOSEC) 40 MG capsule Take 40 mg by mouth daily.       No current facility-administered medications on file prior to visit.    Review of Systems:  As per HPI, otherwise negative.    Physical Examination: Filed Vitals:   06/25/13 0919  BP: 118/80  Pulse: 92  Temp: 98.2 F (36.8 C)  Resp: 16   Filed Vitals:   06/25/13 0919  Height: 5\' 4"  (1.626 m)  Weight: 249 lb (112.946 kg)   Body mass index is 42.72 kg/(m^2). Ideal Body Weight: Weight in (lb) to have BMI =  25: 145.3  GEN: WDWN, NAD, Non-toxic, A & O x 3  Not icteric or septic. HEENT: Atraumatic, Normocephalic. Neck supple. No masses, No LAD. Ears and Nose: No external deformity. CV: RRR, No M/G/R. No JVD. No thrill. No extra heart sounds. PULM: CTA B, no wheezes, crackles, rhonchi. No retractions. No resp. distress. No accessory muscle use. ABD: S,  ND, +BS. No rebound. No HSM.  Marked tenderness generalized more in LLQ EXTR: No c/c/e NEURO Normal gait.  PSYCH: Normally interactive. Conversant. Not depressed or anxious appearing.  Calm demeanor.    Assessment and Plan: LLQ pain  Diverticulosis cipro Flagyl Discussed red flags and plan  Signed,  Phillips Odor, MD   Results for orders placed in visit on 06/25/13  POCT CBC      Result  Value Range   WBC 8.9  4.6 - 10.2 K/uL   Lymph, poc 1.6  0.6 - 3.4   POC LYMPH PERCENT 18.5  10 - 50 %L   MID (cbc) 0.6  0 - 0.9   POC MID % 6.3  0 - 12 %M   POC Granulocyte 6.7  2 - 6.9   Granulocyte percent 75.2  37 - 80 %G   RBC 4.76  4.04 - 5.48 M/uL   Hemoglobin 13.9  12.2 - 16.2 g/dL   HCT, POC 16.1  09.6 - 47.9 %   MCV 92.4  80 - 97 fL   MCH, POC 29.2  27 - 31.2 pg   MCHC 31.6 (*) 31.8 - 35.4 g/dL   RDW, POC 04.5     Platelet Count, POC 179  142 - 424 K/uL   MPV 10.9  0 - 99.8 fL  POCT URINALYSIS DIPSTICK      Result Value Range   Color, UA yellow     Clarity, UA clear     Glucose, UA neg     Bilirubin, UA neg     Ketones, UA neg     Spec Grav, UA 1.015     Blood, UA tr-intact     pH, UA 6.0     Protein, UA neg     Urobilinogen, UA 0.2     Nitrite, UA neg     Leukocytes, UA Negative    POCT UA - MICROSCOPIC ONLY      Result Value Range   WBC, Ur, HPF, POC 0-2     RBC, urine, microscopic 0-1     Bacteria, U Microscopic trace     Mucus, UA small     Epithelial cells, urine per micros 5-8     Crystals, Ur, HPF, POC neg     Casts, Ur, LPF, POC neg     Yeast, UA neg      [facility] reading (PRIMARY) by  Dr. Dareen Piano.  No free air/obstruction.Marland Kitchen

## 2013-07-05 ENCOUNTER — Encounter: Payer: Self-pay | Admitting: Gastroenterology

## 2013-08-03 ENCOUNTER — Encounter: Payer: Self-pay | Admitting: Gastroenterology

## 2013-08-03 ENCOUNTER — Ambulatory Visit (INDEPENDENT_AMBULATORY_CARE_PROVIDER_SITE_OTHER): Payer: BC Managed Care – PPO | Admitting: Gastroenterology

## 2013-08-03 VITALS — BP 120/90 | HR 60 | Ht 64.5 in | Wt 252.8 lb

## 2013-08-03 DIAGNOSIS — R1013 Epigastric pain: Secondary | ICD-10-CM

## 2013-08-03 DIAGNOSIS — G8929 Other chronic pain: Secondary | ICD-10-CM

## 2013-08-03 DIAGNOSIS — R197 Diarrhea, unspecified: Secondary | ICD-10-CM

## 2013-08-03 MED ORDER — CHOLESTYRAMINE 4 GM/DOSE PO POWD
4.0000 g | Freq: Two times a day (BID) | ORAL | Status: DC
Start: 2013-08-03 — End: 2015-09-05

## 2013-08-03 NOTE — Progress Notes (Signed)
Review of pertinent gastrointestinal problems: 1. Acute cholecystis, 09/2012 underwent lap converted to open Chole with Dr. Dwain SarnaWakefield. Slow recovery. 2. Colonoscopy 01/2007 Dr. Christella HartiganJacobs for FOBT + stool: diverticulosis, hemorrhoids.  No polyps  HPI: This is a   very pleasant 53 year old woman whom I last saw many years ago.  She had her GB removed last year.  Was having issues with her bowels (explosive diarrhea, pain) even before the GB was removed.  Around holidays, was having abd pain.  Urgent clinic and plain films done, was told she may have diverticulosis.  Was put on two abx.  No overt GI bleeding.  Her weight has been stable.  Urgent loose stools (6-7 time per day).  Drinks 2 cups coffee in AM.  Then decaf after that.   Review of systems: Pertinent positive and negative review of systems were noted in the above HPI section. Complete review of systems was performed and was otherwise normal.    Past Medical History  Diagnosis Date  . Hypothyroidism   . Complication of anesthesia     hard to wake up once  . Obesity (BMI 30-39.9) 07/25/2012    Past Surgical History  Procedure Laterality Date  . Cesarean section    . Cesarean section    . Foot surgery      ganglion cyst removed from right foot  . Vein strippiing      left leg  . Cholecystectomy  07/21/2012    Procedure: LAPAROSCOPIC CHOLECYSTECTOMY WITH INTRAOPERATIVE CHOLANGIOGRAM;  Surgeon: Emelia LoronMatthew Wakefield, MD;  Location: [facility];  Service: General;  Laterality: N/A;  attempted  . Cholecystectomy  07/21/2012    Procedure: CHOLECYSTECTOMY;  Surgeon: Emelia LoronMatthew Wakefield, MD;  Location: [facility];  Service: General;  Laterality: N/A;    Current Outpatient Prescriptions  Medication Sig Dispense Refill  . Cholecalciferol (VITAMIN D) 2000 UNITS CAPS Take 1 capsule by mouth daily.      . Multiple Vitamin (MULTIVITAMIN) tablet Take 1 tablet by mouth daily.       No current facility-administered medications for this visit.     Allergies as of 08/03/2013  . (No Known Allergies)    Family History  Problem Relation Age of Onset  . Urolithiasis Son   . Cancer Mother   . Cancer Brother   . Cancer Maternal Grandmother     History   Social History  . Marital Status: Married    Spouse Name: N/A    Number of Children: 2  . Years of Education: N/A   Occupational History  . Massage Therapist    Social History Main Topics  . Smoking status: Former Smoker -- 1.50 packs/day for 20 years    Quit date: 07/20/1994  . Smokeless tobacco: Never Used  . Alcohol Use: Yes     Comment: Occassional Use  . Drug Use: No  . Sexual Activity: Not on file   Other Topics Concern  . Not on file   Social History Narrative  . No narrative on file       Physical Exam: BP 120/90  Pulse 60  Ht 5' 4.5" (1.638 m)  Wt 252 lb 12.8 oz (114.669 kg)  BMI 42.74 kg/m2  LMP 07/28/2013 Constitutional: generally well-appearing Psychiatric: alert and oriented x3 Eyes: extraocular movements intact Mouth: oral pharynx moist, no lesions Neck: supple no lymphadenopathy Cardiovascular: heart regular rate and rhythm Lungs: clear to auscultation bilaterally Abdomen: soft, nontender, nondistended, no obvious ascites, no peritoneal signs, normal bowel sounds Extremities: no lower extremity edema bilaterally  Skin: no lesions on visible extremities    Assessment and plan: 53 y.o. female with  chronic abdominal pain, chronic diarrhea  I think she may have a component of bile acid related diarrhea and I'm going to try her on cholestyramine. She had her gallbladder removed about a year ago and the operative and pathologic findings to suggest: Cystitis with chronic and acute changes. Perhaps her gallbladder has been functioning poorly for quite a while even before it was removed. She is not losing weight and has no overt bleeding. She a colonoscopy 7 years ago. She will return to see me in 5 weeks and sooner if needed. If she does  not notice significant improvement in her diarrhea and some of her chronic pains then I would likely recommend further testing. She did have an acute episode of pain somewhat left side of her abdomen she was told this is for diverticulitis. I would like to with the next episode of pain that is similar to that have her get a CT scan in order to be more certain that we are actually dealing with diverticulitis.

## 2013-08-03 NOTE — Patient Instructions (Addendum)
Trial of cholestyramine powder, once dose daily may increase to twice daily. Please return to see Dr. Christella HartiganJacobs in 5 weeks. Call if you have severe abd pains, would like to more certainly diagnose diverticulitis.

## 2013-09-14 ENCOUNTER — Ambulatory Visit (INDEPENDENT_AMBULATORY_CARE_PROVIDER_SITE_OTHER): Payer: BC Managed Care – PPO | Admitting: Gastroenterology

## 2013-09-14 ENCOUNTER — Encounter: Payer: Self-pay | Admitting: Gastroenterology

## 2013-09-14 DIAGNOSIS — R197 Diarrhea, unspecified: Secondary | ICD-10-CM

## 2013-09-14 NOTE — Patient Instructions (Signed)
Cut the cholestryamine dose in 1/2 (1/2 packet every day). For 2 weeks. If still bothered with constipation, then go down to 1/4 packet daily. Please return to see Dr. Christella HartiganJacobs in 2 months, cancel if you are feeling the dose works very well.

## 2013-09-14 NOTE — Progress Notes (Signed)
Review of pertinent gastrointestinal problems:  1. Acute cholecystis, 09/2012 underwent lap converted to open Chole with Dr. Dwain SarnaWakefield. Slow recovery.  2. Colonoscopy 01/2007 Dr. Christella HartiganJacobs for FOBT + stool: diverticulosis, hemorrhoids. No polyps  HPI: This is a  very pleasant 53 year old woman whom I last saw one month ago.  Has been on cholestyramine powder for the past month. Was going 6-7 time per day, now once every 2-3 days.  Always yellow color stool.  Not watery.  Does have consistency.    Two episodes of LLQ pain.  WHole abd tight, felt "inflamed", these episodes subsided after about an hour   Past Medical History  Diagnosis Date  . Hypothyroidism   . Complication of anesthesia     hard to wake up once  . Obesity (BMI 30-39.9) 07/25/2012  . Morbid obesity with BMI of 40.0-44.9, adult     Past Surgical History  Procedure Laterality Date  . Cesarean section    . Cesarean section    . Foot surgery      ganglion cyst removed from right foot  . Vein strippiing      left leg  . Cholecystectomy  07/21/2012    Procedure: LAPAROSCOPIC CHOLECYSTECTOMY WITH INTRAOPERATIVE CHOLANGIOGRAM;  Surgeon: Emelia LoronMatthew Wakefield, MD;  Location: [facility];  Service: General;  Laterality: N/A;  attempted  . Cholecystectomy  07/21/2012    Procedure: CHOLECYSTECTOMY;  Surgeon: Emelia LoronMatthew Wakefield, MD;  Location: [facility];  Service: General;  Laterality: N/A;    Current Outpatient Prescriptions  Medication Sig Dispense Refill  . Cholecalciferol (VITAMIN D) 2000 UNITS CAPS Take 1 capsule by mouth daily.      . cholestyramine (QUESTRAN) 4 GM/DOSE powder Take 1 packet (4 g total) by mouth 2 (two) times daily with a meal.  378 g  12  . Multiple Vitamin (MULTIVITAMIN) tablet Take 1 tablet by mouth daily.       No current facility-administered medications for this visit.    Allergies as of 09/14/2013  . (No Known Allergies)    Family History  Problem Relation Age of Onset  . Urolithiasis Son   . Cancer Mother    . Cancer Brother   . Cancer Maternal Grandmother     History   Social History  . Marital Status: Married    Spouse Name: N/A    Number of Children: 2  . Years of Education: N/A   Occupational History  . Massage Therapist    Social History Main Topics  . Smoking status: Former Smoker -- 1.50 packs/day for 20 years    Quit date: 07/20/1994  . Smokeless tobacco: Never Used  . Alcohol Use: Yes     Comment: Occassional Use  . Drug Use: No  . Sexual Activity: Not on file   Other Topics Concern  . Not on file   Social History Narrative  . No narrative on file      Physical Exam: BP 120/72  Pulse 88  Ht 5\' 4"  (1.626 m)  Wt 254 lb (115.214 kg)  BMI 43.58 kg/m2 Constitutional: generally well-appearing Psychiatric: alert and oriented x3 Abdomen: soft, nontender, nondistended, no obvious ascites, no peritoneal signs, normal bowel sounds     Assessment and plan: 53 y.o. female with bowel changes  She started taking cholestyramine powder one month ago for diarrhea that was likely related to her gallbladder removal about a year prior. She is now constipated. I explained to her that this means the medicine is effective and we simply need to find  the appropriate dose for her. For the next 2 weeks she will cut back to one half pack of cholestyramine every day. If this is still constipating then she can cut down to one quarter packet every day. She will return to see me in 6-7 weeks and sooner if needed.

## 2013-12-06 ENCOUNTER — Telehealth: Payer: Self-pay | Admitting: Gastroenterology

## 2013-12-06 NOTE — Telephone Encounter (Signed)
Pt states she started having LLQ abdominal pain again Saturday night, states it woke her up. The pain continued Sunday and today. She states that now it hurts when she takes a deep breath and her whole abdomen feels sore and tender. 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 ?Diverticulitis flare. Please advise.

## 2013-12-07 MED ORDER — METRONIDAZOLE 250 MG PO TABS: 250.0000 mg | ORAL_TABLET | Freq: Three times a day (TID) | ORAL | Status: DC

## 2013-12-07 MED ORDER — CIPROFLOXACIN HCL 500 MG PO TABS: 500.0000 mg | ORAL_TABLET | Freq: Two times a day (BID) | ORAL | Status: DC

## 2013-12-07 NOTE — Telephone Encounter (Signed)
Pt aware and scripts sent in to pharmacy. Pt scheduled for OV 02/07/14@10 :30am.

## 2013-12-07 NOTE — Telephone Encounter (Signed)
This may be from diverticulitis.  Can you call in 7 day course of cipro 500 twice daily, flagyl 250 three times daily.  No refills.  She should call to report on her symptoms next week, also next avail rov to discuss further.

## 2014-02-07 ENCOUNTER — Ambulatory Visit: Payer: BC Managed Care – PPO | Admitting: Gastroenterology

## 2014-03-24 IMAGING — CR DG ABDOMEN 1V
2 series · 2 of 2 positions shown · non-contrast
Comparison: CT scan 02/21/2012.

CLINICAL DATA: Abdominal pain.

ABDOMEN - 1 VIEW

[AP (1 of 2)]
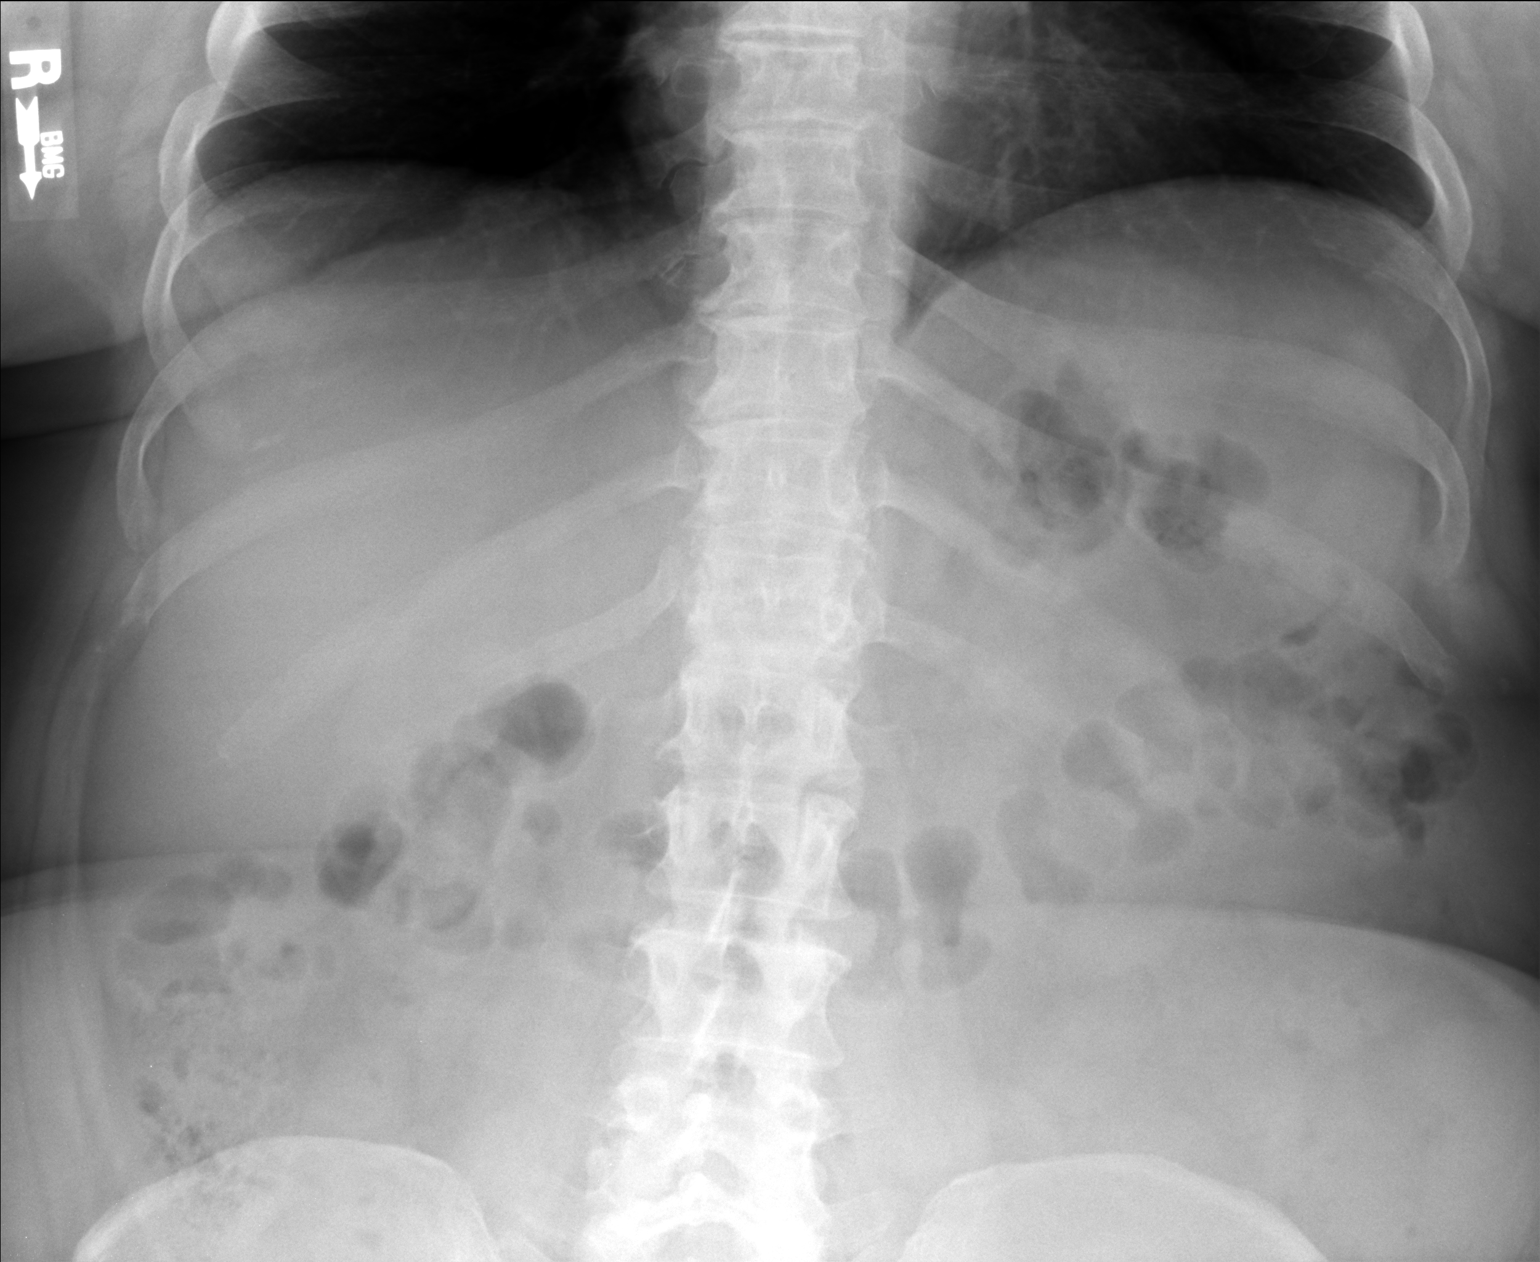

[AP (2 of 2)]
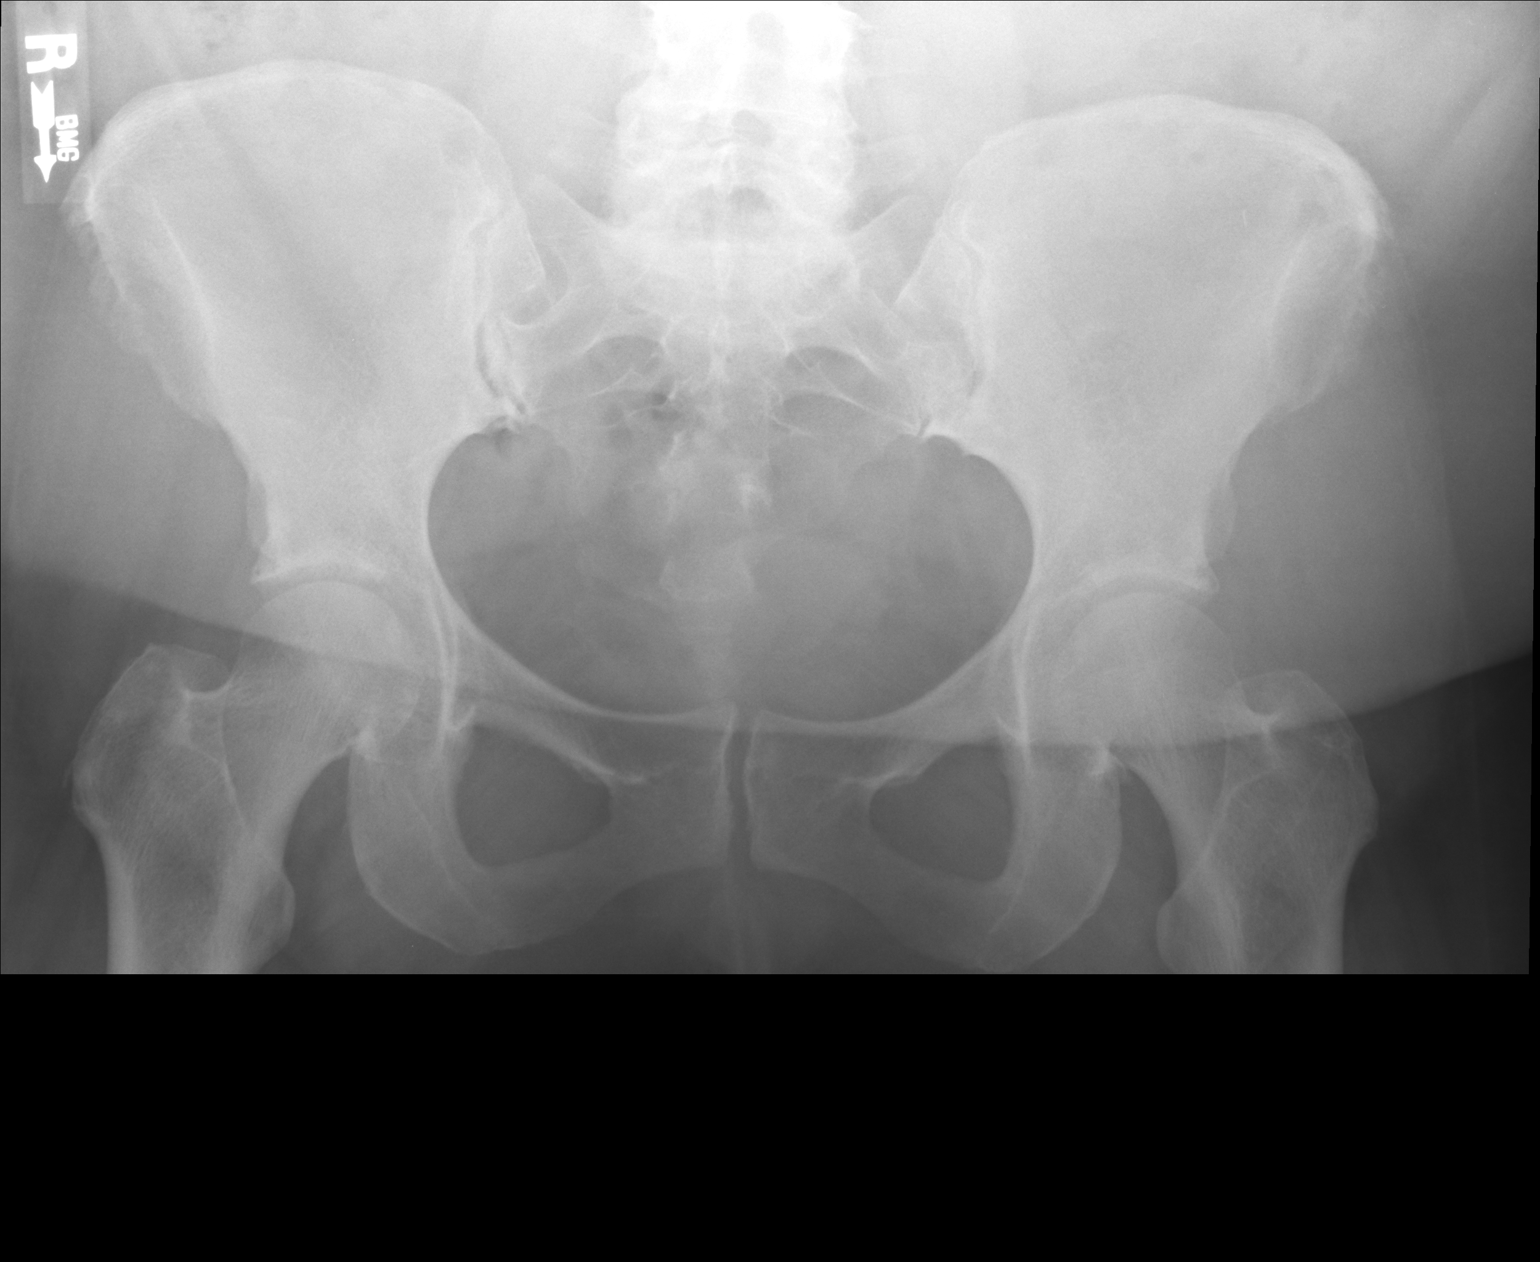

[2 of 2 positions shown; findings below may reference images not displayed]

FINDINGS: The abdominal bowel gas pattern is unremarkable.  No
findings for obstruction or perforation.  The soft tissue shadows
are maintained.  The lung bases are clear.  The bony structures are
unremarkable.
IMPRESSION: Unremarkable abdominal radiograph.

## 2014-04-08 ENCOUNTER — Ambulatory Visit (INDEPENDENT_AMBULATORY_CARE_PROVIDER_SITE_OTHER): Payer: BC Managed Care – PPO | Admitting: Gastroenterology

## 2014-04-08 ENCOUNTER — Encounter: Payer: Self-pay | Admitting: Gastroenterology

## 2014-04-08 VITALS — BP 122/80 | HR 72 | Ht 64.0 in | Wt 257.2 lb

## 2014-04-08 DIAGNOSIS — K5900 Constipation, unspecified: Secondary | ICD-10-CM

## 2014-04-08 NOTE — Patient Instructions (Signed)
Cut back the cholestyramine powder to 1/2 tsp every other day. Can adjust downward even more if needed. Call in 1- 2 months if still bothered by irregularity.

## 2014-04-08 NOTE — Progress Notes (Signed)
Review of pertinent gastrointestinal problems:  1. Acute cholecystis, 09/2012 underwent lap converted to open Chole with Dr. Dwain SarnaWakefield. Slow recovery.  2015 post chole diarrhea improved with cholestyramine 1/2 to 1 pill every day.  Very sensitive to bile acid binding agents. 2. Colonoscopy 01/2007 Dr. Christella HartiganJacobs for FOBT + stool: diverticulosis, hemorrhoids. No polyps  HPI: This is a very pleasant 53 year old woman whom I last saw several months ago.  At last visit, decreased her cholestyramine to 1/2 tsp per day and that will often cause constipation.    Empirically treated left sided abd pains with cipro/flagyl 11/2013, ? Diveriticulitis.  I'm not so sure that this is the case looking back. May have just been significant constipation and constipation related cramping, abdominal pains.   Past Medical History  Diagnosis Date  . Hypothyroidism   . Complication of anesthesia     hard to wake up once  . Obesity (BMI 30-39.9) 07/25/2012  . Morbid obesity with BMI of 40.0-44.9, adult     Past Surgical History  Procedure Laterality Date  . Cesarean section    . Cesarean section    . Foot surgery      ganglion cyst removed from right foot  . Vein strippiing      left leg  . Cholecystectomy  07/21/2012    Procedure: LAPAROSCOPIC CHOLECYSTECTOMY WITH INTRAOPERATIVE CHOLANGIOGRAM;  Surgeon: Emelia LoronMatthew Wakefield, MD;  Location: [facility];  Service: General;  Laterality: N/A;  attempted  . Cholecystectomy  07/21/2012    Procedure: CHOLECYSTECTOMY;  Surgeon: Emelia LoronMatthew Wakefield, MD;  Location: [facility];  Service: General;  Laterality: N/A;    Current Outpatient Prescriptions  Medication Sig Dispense Refill  . Cholecalciferol (VITAMIN D) 2000 UNITS CAPS Take 1 capsule by mouth daily.      . cholestyramine (QUESTRAN) 4 GM/DOSE powder Take 1 packet (4 g total) by mouth 2 (two) times daily with a meal.  378 g  12  . Multiple Vitamin (MULTIVITAMIN) tablet Take 1 tablet by mouth daily.       No current  facility-administered medications for this visit.    Allergies as of 04/08/2014  . (No Known Allergies)    Family History  Problem Relation Age of Onset  . Urolithiasis Son   . Cancer Mother   . Cancer Brother   . Cancer Maternal Grandmother     History   Social History  . Marital Status: Married    Spouse Name: N/A    Number of Children: 2  . Years of Education: N/A   Occupational History  . Massage Therapist    Social History Main Topics  . Smoking status: Former Smoker -- 1.50 packs/day for 20 years    Quit date: 07/20/1994  . Smokeless tobacco: Never Used  . Alcohol Use: Yes     Comment: Occassional Use  . Drug Use: No  . Sexual Activity: Not on file   Other Topics Concern  . Not on file   Social History Narrative  . No narrative on file      Physical Exam: BP 122/80  Pulse 72  Ht 5\' 4"  (1.626 m)  Wt 257 lb 4 oz (116.688 kg)  BMI 44.14 kg/m2 Constitutional: morbidly obese Psychiatric: alert and oriented x3 Abdomen: soft, nontender, nondistended, no obvious ascites, no peritoneal signs, normal bowel sounds     Assessment and plan: 53 y.o. female with bile acid related diarrhea  Cholestyramine works for her, almost too well. She is going to adjust her dose downward even further.  She will take half a teaspoon of the powder every other day and she knows she can decrease this further if needed. I think the goal for her would be to have a bowel movement every day or 2. Prior to her gallbladder removal she was having 2-3 bowel movements per day and I suspect that she is constipated for more than 2 or 3 days she has significant discomfort from it.

## 2014-04-14 ENCOUNTER — Ambulatory Visit (INDEPENDENT_AMBULATORY_CARE_PROVIDER_SITE_OTHER): Payer: BC Managed Care – PPO | Admitting: Family Medicine

## 2014-04-14 VITALS — BP 116/80 | HR 77 | Temp 97.9°F | Resp 18 | Ht 63.75 in | Wt 256.0 lb

## 2014-04-14 DIAGNOSIS — R059 Cough, unspecified: Secondary | ICD-10-CM

## 2014-04-14 DIAGNOSIS — R05 Cough: Secondary | ICD-10-CM

## 2014-04-14 DIAGNOSIS — J020 Streptococcal pharyngitis: Secondary | ICD-10-CM

## 2014-04-14 DIAGNOSIS — J0100 Acute maxillary sinusitis, unspecified: Secondary | ICD-10-CM

## 2014-04-14 MED ORDER — HYDROCODONE-HOMATROPINE 5-1.5 MG/5ML PO SYRP: 5.0000 mL | ORAL_SOLUTION | Freq: Three times a day (TID) | ORAL | Status: DC | PRN

## 2014-04-14 MED ORDER — AMOXICILLIN 875 MG PO TABS: 875.0000 mg | ORAL_TABLET | Freq: Two times a day (BID) | ORAL | Status: DC

## 2014-04-14 NOTE — Patient Instructions (Signed)

## 2014-04-14 NOTE — Progress Notes (Signed)
   Subjective:    Patient ID: Tiffany Glover, female    DOB: [birth year], 53 y.o.   MRN: [id number] This chart was scribed for Elvina SidleKurt Chanteria Haggard, MD by Littie Deedsichard Sun, Medical Scribe. This patient was seen in Room 10 and the patient's care was started at 2:38 PM.   HPI HPI Comments: Tiffany GhaziRhonda C Clair is a 53 y.o. female who presents to the [facility] complaining of gradual onset URI symptoms that began 5 days ago. She was mowing the yard 6 days ago, and she noted more leaves than normal. The next morning, she had a progressively worsening HA that has resolved now. Since onset of the HA, she notes having sore throat, nasal congestion, dry cough, voice changes (losing her voice), difficulty swallowing, and sore ribs due to coughing.  She currently works as a Teacher, adult educationmassage therapist. Patient quit smoking in 1996. She has hx of ear infections as a child. NKDA.    Review of Systems  Constitutional: Negative for appetite change.  HENT: Positive for congestion, sore throat, trouble swallowing and voice change.   Eyes: Negative for discharge.  Respiratory: Positive for cough.   Gastrointestinal: Negative for diarrhea.  Genitourinary: Negative for frequency and hematuria.  Musculoskeletal: Negative for back pain.  Skin: Negative for rash.  Neurological: Negative for seizures and headaches.  Psychiatric/Behavioral: Negative for hallucinations.      Objective:   Physical Exam CONSTITUTIONAL: Well developed/well nourished HEAD: Normocephalic/atraumatic EYES: EOM/PERRL ENMT: Mucous membranes moist, oropharynx clear. Nose with swollen nasal passages. MP discharge. White, opaque right TM with bubbles. NECK: supple no meningeal signs, no cervical adenopathy. SPINE: entire spine nontender CV: S1/S2 noted, no murmurs/rubs/gallops noted LUNGS: Lungs are clear to auscultation bilaterally, no apparent distress ABDOMEN: soft, nontender, no rebound or guarding GU: no cva tenderness NEURO: Pt is  awake/alert, moves all extremitiesx4 EXTREMITIES: pulses normal, full ROM SKIN: warm, color normal PSYCH: no abnormalities of mood noted      Assessment & Plan:  Acute maxillary sinusitis, recurrence not specified - Plan: amoxicillin (AMOXIL) 875 MG tablet, HYDROcodone-homatropine (HYCODAN) 5-1.5 MG/5ML syrup  Cough - Plan: amoxicillin (AMOXIL) 875 MG tablet, HYDROcodone-homatropine (HYCODAN) 5-1.5 MG/5ML syrup  Streptococcal sore throat - Plan: HYDROcodone-homatropine (HYCODAN) 5-1.5 MG/5ML syrup  Signed, Elvina SidleKurt Monda Chastain, MD

## 2014-04-16 ENCOUNTER — Telehealth: Payer: Self-pay

## 2014-04-16 MED ORDER — PREDNISONE 20 MG PO TABS: ORAL_TABLET | ORAL | Status: DC

## 2014-04-16 NOTE — Telephone Encounter (Signed)
Please advise 

## 2014-04-16 NOTE — Telephone Encounter (Signed)
Patient called - states she is not feeling any better and was told she could maybe try a steroid. Please return call at [phone]

## 2014-04-16 NOTE — Telephone Encounter (Signed)
Meds ordered this encounter  Medications  . predniSONE (DELTASONE) 20 MG tablet    Sig: Take 3 PO QAM x3days, 2 PO QAM x3days, 1 PO QAM x3days    Dispense:  18 tablet    Refill:  0    Order Specific Question:  Supervising Provider    Answer:  DOOLITTLE, ROBERT P [[id number]]    

## 2014-04-16 NOTE — Telephone Encounter (Signed)
Spoke to pt- she is going to try Musinex, Cepacol, and the steroid that was called in today. Pt states that she does not think the amoxicillin is working. Asked pt to give it a few more days and call us back if she is not responding. She states she is encouraged she will get better with the addition of prednisone.

## 2014-04-24 ENCOUNTER — Telehealth: Payer: Self-pay

## 2014-04-24 NOTE — Telephone Encounter (Signed)
Patient has finished both medications she was prescribed at her last visit. She is still not feeling better

## 2014-04-25 ENCOUNTER — Ambulatory Visit (INDEPENDENT_AMBULATORY_CARE_PROVIDER_SITE_OTHER): Payer: BC Managed Care – PPO | Admitting: Family Medicine

## 2014-04-25 ENCOUNTER — Ambulatory Visit (INDEPENDENT_AMBULATORY_CARE_PROVIDER_SITE_OTHER): Payer: BC Managed Care – PPO

## 2014-04-25 VITALS — BP 126/84 | HR 76 | Temp 98.4°F | Resp 18 | Ht 63.75 in | Wt 255.6 lb

## 2014-04-25 DIAGNOSIS — R05 Cough: Secondary | ICD-10-CM

## 2014-04-25 DIAGNOSIS — R0982 Postnasal drip: Secondary | ICD-10-CM

## 2014-04-25 DIAGNOSIS — R059 Cough, unspecified: Secondary | ICD-10-CM

## 2014-04-25 DIAGNOSIS — J0100 Acute maxillary sinusitis, unspecified: Secondary | ICD-10-CM

## 2014-04-25 MED ORDER — AZITHROMYCIN 250 MG PO TABS: ORAL_TABLET | ORAL | Status: DC

## 2014-04-25 MED ORDER — IPRATROPIUM BROMIDE 0.03 % NA SOLN: 2.0000 | Freq: Four times a day (QID) | NASAL | Status: DC

## 2014-04-25 MED ORDER — HYDROCODONE-HOMATROPINE 5-1.5 MG/5ML PO SYRP: 5.0000 mL | ORAL_SOLUTION | Freq: Three times a day (TID) | ORAL | Status: DC | PRN

## 2014-04-25 NOTE — Progress Notes (Signed)
[facility] [address], [place] [id number] [phone]  Date:  04/25/2014   Name:  Tiffany GhaziRhonda C Glover   DOB:  Aug 02, 1960   MRN:  [id number]  PCP:  Pcp Not In System    Chief Complaint: Follow-up   History of Present Illness:  Tiffany Glover is a 53 y.o. very pleasant female patient who presents with the following:  She is here today to follow-up from recent episode of sinusitis.  She was seen here 10 days ago, tx with amoxicillin and hycodan.   Since her last visit she does not feel that she has gotten much better.  Her left ear is itching, and she feels as through she has "thick mucus" in her throat.  Her hears are popping but her nose is clear.   She has not noted a fever, no vomiting or diarrhea.   She is coughing still.  She does cough up mucus some of the time.   She used prednisone starting on 10/17 (she called in with continued sx and this was rx)- she finished this yesterday.   Her body is sore from coughing Patient Active Problem List   Diagnosis Date Noted  . Obesity, morbid 09/14/2013  . HYPOTHYROIDISM 09/04/2007  . INTERNAL HEMORRHOIDS 09/04/2007  . EXTERNAL HEMORRHOIDS 09/04/2007  . DIVERTICULOSIS OF COLON 09/04/2007    Past Medical History  Diagnosis Date  . Hypothyroidism   . Complication of anesthesia     hard to wake up once  . Obesity (BMI 30-39.9) 07/25/2012  . Morbid obesity with BMI of 40.0-44.9, adult     Past Surgical History  Procedure Laterality Date  . Cesarean section    . Cesarean section    . Foot surgery      ganglion cyst removed from right foot  . Vein strippiing      left leg  . Cholecystectomy  07/21/2012    Procedure: LAPAROSCOPIC CHOLECYSTECTOMY WITH INTRAOPERATIVE CHOLANGIOGRAM;  Surgeon: Emelia LoronMatthew Wakefield, MD;  Location: [facility];  Service: General;  Laterality: N/A;  attempted  . Cholecystectomy  07/21/2012    Procedure: CHOLECYSTECTOMY;  Surgeon: Emelia LoronMatthew Wakefield, MD;  Location: [facility];  Service: General;   Laterality: N/A;    History  Substance Use Topics  . Smoking status: Former Smoker -- 1.50 packs/day for 20 years    Quit date: 07/20/1994  . Smokeless tobacco: Never Used  . Alcohol Use: Yes     Comment: Occassional Use    Family History  Problem Relation Age of Onset  . Urolithiasis Son   . Cancer Mother   . Cancer Brother   . Cancer Maternal Grandmother     No Known Allergies  Medication list has been reviewed and updated.  Current Outpatient Prescriptions on File Prior to Visit  Medication Sig Dispense Refill  . Cholecalciferol (VITAMIN D) 2000 UNITS CAPS Take 1 capsule by mouth daily.      . cholestyramine (QUESTRAN) 4 GM/DOSE powder Take 1 packet (4 g total) by mouth 2 (two) times daily with a meal.  378 g  12  . Multiple Vitamin (MULTIVITAMIN) tablet Take 1 tablet by mouth daily.      Marland Kitchen. amoxicillin (AMOXIL) 875 MG tablet Take 1 tablet (875 mg total) by mouth 2 (two) times daily.  20 tablet  0  . HYDROcodone-homatropine (HYCODAN) 5-1.5 MG/5ML syrup Take 5 mLs by mouth every 8 (eight) hours as needed for cough.  120 mL  0  . predniSONE (DELTASONE) 20 MG  tablet Take 3 PO QAM x3days, 2 PO QAM x3days, 1 PO QAM x3days  18 tablet  0   No current facility-administered medications on file prior to visit.    Review of Systems:  As per HPI- otherwise negative.   Physical Examination: Filed Vitals:   04/25/14 1246  BP: 126/84  Pulse: 76  Temp: 98.4 F (36.9 C)  Resp: 18   Filed Vitals:   04/25/14 1246  Height: 5' 3.75" (1.619 m)  Weight: 255 lb 9.6 oz (115.939 kg)   Body mass index is 44.23 kg/(m^2). Ideal Body Weight: Weight in (lb) to have BMI = 25: 144.2  GEN: WDWN, NAD, Non-toxic, A & O x 3, obese, looks well HEENT: Atraumatic, Normocephalic. Neck supple. No masses, No LAD.  Bilateral TM wnl, oropharynx normal.  PEERL,EOMI.   Ears and Nose: No external deformity. CV: RRR, No M/G/R. No JVD. No thrill. No extra heart sounds. PULM: CTA B, no wheezes,  crackles, rhonchi. No retractions. No resp. distress. No accessory muscle use. EXTR: No c/c/e NEURO Normal gait.  PSYCH: Normally interactive. Conversant. Not depressed or anxious appearing.  Calm demeanor.   [facility] reading (PRIMARY) by  Dr. Patsy Lageropland. CXR:  negative CHEST 2 VIEW  COMPARISON: 06/25/2013  FINDINGS: Normal heart size, mediastinal contours, and pulmonary vascularity.  Minimal chronic peribronchial thickening.  No acute infiltrate, pleural effusion or pneumothorax.  No acute osseous findings.  IMPRESSION: Minimal chronic bronchitic changes without infiltrate.   Assessment and Plan: Cough - Plan: HYDROcodone-homatropine (HYCODAN) 5-1.5 MG/5ML syrup, azithromycin (ZITHROMAX) 250 MG tablet, DG Chest 2 View  Acute maxillary sinusitis, recurrence not specified - Plan: HYDROcodone-homatropine (HYCODAN) 5-1.5 MG/5ML syrup  PND (post-nasal drip) - Plan: ipratropium (ATROVENT) 0.03 % nasal spray  Here today with persistent cough after recent tx with amoxicillin and prednione.  She has complaint if PND/ thick mucus in her throat.  Will try atrovent nasal and refilled her hycodan.  She may fill the azithromycin if she wishes or may wait and see how she does over the next few days. Let me know if not better soon!     Signed Abbe AmsterdamJessica Copland, MD

## 2014-04-25 NOTE — Telephone Encounter (Signed)
LM for pt to RTC to be reevaluated for sinusitis.

## 2014-04-25 NOTE — Patient Instructions (Signed)
Use the atrovent nasal spray up to 4x a day for mucus in your throat.  Continue to use cough syrup as needed.  You can also add the azithromycin antibiotic.  Let me know if you do not feel better in the next few days- Sooner if worse.   Remember the cough syrup will make you sleepy

## 2014-05-17 DIAGNOSIS — R7301 Impaired fasting glucose: Secondary | ICD-10-CM | POA: Insufficient documentation

## 2014-05-17 DIAGNOSIS — J309 Allergic rhinitis, unspecified: Secondary | ICD-10-CM | POA: Insufficient documentation

## 2014-05-17 DIAGNOSIS — N951 Menopausal and female climacteric states: Secondary | ICD-10-CM | POA: Insufficient documentation

## 2014-05-17 DIAGNOSIS — E669 Obesity, unspecified: Secondary | ICD-10-CM | POA: Insufficient documentation

## 2014-05-17 DIAGNOSIS — M25519 Pain in unspecified shoulder: Secondary | ICD-10-CM | POA: Insufficient documentation

## 2014-05-17 DIAGNOSIS — E785 Hyperlipidemia, unspecified: Secondary | ICD-10-CM | POA: Insufficient documentation

## 2014-05-17 DIAGNOSIS — N938 Other specified abnormal uterine and vaginal bleeding: Secondary | ICD-10-CM | POA: Insufficient documentation

## 2014-08-08 ENCOUNTER — Other Ambulatory Visit (INDEPENDENT_AMBULATORY_CARE_PROVIDER_SITE_OTHER): Payer: Self-pay | Admitting: General Surgery

## 2014-08-08 DIAGNOSIS — K432 Incisional hernia without obstruction or gangrene: Secondary | ICD-10-CM | POA: Insufficient documentation

## 2014-08-11 ENCOUNTER — Ambulatory Visit
Admission: RE | Admit: 2014-08-11 | Discharge: 2014-08-11 | Disposition: A | Discharge status: Home / Self Care | Payer: BC Managed Care – PPO | Source: Ambulatory Visit | Attending: General Surgery | Admitting: General Surgery

## 2014-08-11 DIAGNOSIS — K432 Incisional hernia without obstruction or gangrene: Secondary | ICD-10-CM

## 2014-08-11 MED ORDER — IOHEXOL 300 MG/ML  SOLN
125.0000 mL | Freq: Once | INTRAMUSCULAR | Status: AC | PRN
  Administered 2014-08-11: 125 mL via INTRAVENOUS

## 2014-08-24 ENCOUNTER — Other Ambulatory Visit (INDEPENDENT_AMBULATORY_CARE_PROVIDER_SITE_OTHER): Payer: Self-pay | Admitting: General Surgery

## 2014-09-08 ENCOUNTER — Encounter (HOSPITAL_COMMUNITY)
Admission: RE | Admit: 2014-09-08 | Discharge: 2014-09-08 | Disposition: A | Discharge status: Home / Self Care | Payer: BC Managed Care – PPO | Source: Ambulatory Visit | Attending: General Surgery | Admitting: General Surgery

## 2014-09-08 ENCOUNTER — Encounter (HOSPITAL_COMMUNITY): Payer: Self-pay

## 2014-09-08 DIAGNOSIS — Z01812 Encounter for preprocedural laboratory examination: Secondary | ICD-10-CM | POA: Insufficient documentation

## 2014-09-08 LAB — URINALYSIS, ROUTINE W REFLEX MICROSCOPIC
BILIRUBIN URINE: NEGATIVE
Glucose, UA: NEGATIVE mg/dL
Hgb urine dipstick: NEGATIVE
Ketones, ur: NEGATIVE mg/dL
LEUKOCYTES UA: NEGATIVE
Nitrite: NEGATIVE
PH: 6 (ref 5.0–8.0)
Protein, ur: NEGATIVE mg/dL
SPECIFIC GRAVITY, URINE: 1.026 (ref 1.005–1.030)
Urobilinogen, UA: 0.2 mg/dL (ref 0.0–1.0)

## 2014-09-08 LAB — BASIC METABOLIC PANEL
Anion gap: 11 (ref 5–15)
BUN: 18 mg/dL (ref 6–23)
CO2: 23 mmol/L (ref 19–32)
Calcium: 9.3 mg/dL (ref 8.4–10.5)
Chloride: 107 mmol/L (ref 96–112)
Creatinine, Ser: 0.61 mg/dL (ref 0.50–1.10)
GFR calc non Af Amer: >90 mL/min (ref >90)
GLUCOSE: 105 mg/dL — AB (ref 70–99)
Potassium: 3.6 mmol/L (ref 3.5–5.1)
SODIUM: 141 mmol/L (ref 135–145)

## 2014-09-08 LAB — CBC WITH DIFFERENTIAL/PLATELET
BASOS ABS: 0 10*3/uL (ref 0.0–0.1)
BASOS PCT: 1 % (ref 0–1)
EOS PCT: 2 % (ref 0–5)
Eosinophils Absolute: 0.1 10*3/uL (ref 0.0–0.7)
HEMATOCRIT: 39.6 % (ref 36.0–46.0)
Hemoglobin: 13.9 g/dL (ref 12.0–15.0)
LYMPHS PCT: 28 % (ref 12–46)
Lymphs Abs: 1.4 10*3/uL (ref 0.7–4.0)
MCH: 30 pg (ref 26.0–34.0)
MCHC: 35.1 g/dL (ref 30.0–36.0)
MCV: 85.3 fL (ref 78.0–100.0)
MONOS PCT: 10 % (ref 3–12)
Monocytes Absolute: 0.5 10*3/uL (ref 0.1–1.0)
NEUTROS PCT: 59 % (ref 43–77)
Neutro Abs: 3 10*3/uL (ref 1.7–7.7)
Platelets: 169 10*3/uL (ref 150–400)
RBC: 4.64 MIL/uL (ref 3.87–5.11)
RDW: 13 % (ref 11.5–15.5)
WBC: 5 10*3/uL (ref 4.0–10.5)

## 2014-09-08 LAB — HCG, SERUM, QUALITATIVE: PREG SERUM: NEGATIVE

## 2014-09-08 NOTE — Progress Notes (Signed)
No PCP; STOP-Bang  score 5; faxed to surgeon, Dr Dwain SarnaWakefield

## 2014-09-08 NOTE — Progress Notes (Signed)
   09/08/14 1535  OBSTRUCTIVE SLEEP APNEA  Have you ever been diagnosed with sleep apnea through a sleep study? No  Do you snore loudly (loud enough to be heard through closed doors)?  1  Do you often feel tired, fatigued, or sleepy during the daytime? 1  Has anyone observed you stop breathing during your sleep? 0  Do you have, or are you being treated for high blood pressure? 0  BMI more than 35 kg/m2? 1  Age over 55 years old? 1  Neck circumference greater than 40 cm/16 inches? 1  Gender: 0

## 2014-09-08 NOTE — Pre-Procedure Instructions (Signed)
Tiffany Glover  09/08/2014   Your procedure is scheduled on:  Thursday, March 17  Report to [facility] Admitting at 0930 AM.  Call this number if you have problems the morning of surgery: [phone].              For other questions prior to surgery ,call [phone] Monday- Friday 8am -4pm   Remember:   Do not eat food or drink liquids after midnight.Wednesday night    Take these medicines the morning of surgery with A SIP OF WATER: none   Do not wear jewelry, make-up or nail polish.  Do not wear lotions, powders, or perfumes. You may not wear deodorant.  Do not shave 48 hours prior to surgery.   Do not bring valuables to the hospital.  [facility] is not responsible    for any belongings or valuables.               Contacts, dentures or bridgework may not be worn into surgery.  Leave suitcase in the car. After surgery it may be brought to your room.  For patients admitted to the hospital, discharge time is determined by your   treatment team.      Special Instructions: [facility] - Preparing for Surgery  Before surgery, you can play an important role.  Because skin is not sterile, your skin needs to be as free of germs as possible.  You can reduce the number of germs on you skin by washing with CHG (chlorahexidine gluconate) soap before surgery.  CHG is an antiseptic cleaner which kills germs and bonds with the skin to continue killing germs even after washing.  Please DO NOT use if you have an allergy to CHG or antibacterial soaps.  If your skin becomes reddened/irritated stop using the CHG and inform your nurse when you arrive at Short Stay.  Do not shave (including legs and underarms) for at least 48 hours prior to the first CHG shower.  You may shave your face.  Please follow these instructions carefully:   1.  Shower with CHG Soap the night before surgery and the    morning of Surgery.  2.  If you choose to wash your hair, wash your hair first as usual with  your  normal shampoo.  3.  After you shampoo, rinse your hair and body thoroughly to remove the   Shampoo.  4.  Use CHG as you would any other liquid soap.  You can apply chg directly       to the skin and wash gently with scrungie or a clean washcloth.  5.  Apply the CHG Soap to your body ONLY FROM THE NECK DOWN.    Do not use on open wounds or open sores.  Avoid contact with your eyes,   ears, mouth and genitals (private parts).  Wash genitals (private parts)   with your normal soap.  6.  Wash thoroughly, paying special attention to the area where your surgery  will be performed.  7.  Thoroughly rinse your body with warm water from the neck down.  8.  DO NOT shower/wash with your normal soap after using and rinsing off   the CHG Soap.  9.  Pat yourself dry with a clean towel.            10.  Wear clean pajamas.            11.  Place clean sheets on your bed the night  of your first shower and do not   sleep with pets.  Day of Surgery  Do not apply any lotions/deoderants the morning of surgery.  Please wear clean clothes to the hospital/surgery center.     Please read over the following fact sheets that you were given: Pain Booklet, Coughing and Deep Breathing and Surgical Site Infection Prevention

## 2014-09-14 MED ORDER — CEFAZOLIN SODIUM 10 G IJ SOLR
3.0000 g | INTRAMUSCULAR | Status: DC
  Filled 2014-09-14: qty 3000

## 2014-09-14 MED ORDER — HEPARIN SODIUM (PORCINE) 5000 UNIT/ML IJ SOLN
5000.0000 [IU] | Freq: Once | INTRAMUSCULAR | Status: AC
  Administered 2014-09-15: 5000 [IU] via SUBCUTANEOUS
  Filled 2014-09-14: qty 1

## 2014-09-15 ENCOUNTER — Encounter (HOSPITAL_COMMUNITY)
Admission: RE | Disposition: A | Discharge status: Home / Self Care | Payer: Self-pay | Source: Ambulatory Visit | Attending: General Surgery

## 2014-09-15 ENCOUNTER — Ambulatory Visit (HOSPITAL_COMMUNITY): Payer: BC Managed Care – PPO | Admitting: Anesthesiology

## 2014-09-15 ENCOUNTER — Encounter (HOSPITAL_COMMUNITY): Payer: Self-pay | Admitting: *Deleted

## 2014-09-15 ENCOUNTER — Observation Stay (HOSPITAL_COMMUNITY)
Admission: RE | Admit: 2014-09-15 | Discharge: 2014-09-17 | Disposition: A | Discharge status: Home / Self Care | Payer: BC Managed Care – PPO | Source: Ambulatory Visit | Attending: General Surgery | Admitting: General Surgery

## 2014-09-15 DIAGNOSIS — K5790 Diverticulosis of intestine, part unspecified, without perforation or abscess without bleeding: Secondary | ICD-10-CM | POA: Diagnosis not present

## 2014-09-15 DIAGNOSIS — Z87442 Personal history of urinary calculi: Secondary | ICD-10-CM | POA: Insufficient documentation

## 2014-09-15 DIAGNOSIS — Z87891 Personal history of nicotine dependence: Secondary | ICD-10-CM | POA: Insufficient documentation

## 2014-09-15 DIAGNOSIS — K432 Incisional hernia without obstruction or gangrene: Secondary | ICD-10-CM | POA: Diagnosis not present

## 2014-09-15 HISTORY — PX: INCISIONAL HERNIA REPAIR: SHX193

## 2014-09-15 SURGERY — REPAIR, HERNIA, INCISIONAL, LAPAROSCOPIC
Anesthesia: General | Site: Abdomen

## 2014-09-15 MED ORDER — ACETAMINOPHEN 325 MG PO TABS: 650.0000 mg | ORAL_TABLET | Freq: Four times a day (QID) | ORAL | Status: DC | PRN

## 2014-09-15 MED ORDER — MORPHINE SULFATE 2 MG/ML IJ SOLN
2.0000 mg | INTRAMUSCULAR | Status: DC | PRN
  Administered 2014-09-15 – 2014-09-16 (×5): 2 mg via INTRAVENOUS
  Filled 2014-09-15 (×5): qty 1

## 2014-09-15 MED ORDER — SODIUM CHLORIDE 0.9 % IR SOLN
Status: DC | PRN
  Administered 2014-09-15: 1000 mL

## 2014-09-15 MED ORDER — ONDANSETRON HCL 4 MG/2ML IJ SOLN
INTRAMUSCULAR | Status: DC | PRN
  Administered 2014-09-15: 4 mg via INTRAVENOUS

## 2014-09-15 MED ORDER — LACTATED RINGERS IV SOLN
Freq: Once | INTRAVENOUS | Status: AC
  Administered 2014-09-15: 10:00:00 via INTRAVENOUS

## 2014-09-15 MED ORDER — POLYETHYLENE GLYCOL 3350 17 G PO PACK
17.0000 g | PACK | Freq: Every day | ORAL | Status: DC
Start: 2014-09-15 — End: 2014-09-17
  Administered 2014-09-15 – 2014-09-17 (×3): 17 g via ORAL
  Filled 2014-09-15 (×3): qty 1

## 2014-09-15 MED ORDER — PROPOFOL 10 MG/ML IV BOLUS
INTRAVENOUS | Status: DC | PRN
  Administered 2014-09-15: 150 mg via INTRAVENOUS

## 2014-09-15 MED ORDER — OXYCODONE HCL 5 MG PO TABS
ORAL_TABLET | ORAL | Status: AC
  Administered 2014-09-16: 5 mg via ORAL
  Filled 2014-09-15: qty 1

## 2014-09-15 MED ORDER — FENTANYL CITRATE 0.05 MG/ML IJ SOLN
INTRAMUSCULAR | Status: AC
  Filled 2014-09-15: qty 5

## 2014-09-15 MED ORDER — FENTANYL CITRATE 0.05 MG/ML IJ SOLN
INTRAMUSCULAR | Status: DC | PRN
  Administered 2014-09-15 (×2): 50 ug via INTRAVENOUS
  Administered 2014-09-15: 100 ug via INTRAVENOUS
  Administered 2014-09-15: 50 ug via INTRAVENOUS
  Administered 2014-09-15: 100 ug via INTRAVENOUS
  Administered 2014-09-15 (×3): 50 ug via INTRAVENOUS

## 2014-09-15 MED ORDER — LACTATED RINGERS IV SOLN
INTRAVENOUS | Status: DC | PRN
  Administered 2014-09-15 (×2): via INTRAVENOUS

## 2014-09-15 MED ORDER — ONDANSETRON HCL 4 MG/2ML IJ SOLN: 4.0000 mg | Freq: Four times a day (QID) | INTRAMUSCULAR | Status: DC | PRN

## 2014-09-15 MED ORDER — ROCURONIUM BROMIDE 100 MG/10ML IV SOLN
INTRAVENOUS | Status: DC | PRN
  Administered 2014-09-15: 40 mg via INTRAVENOUS

## 2014-09-15 MED ORDER — BUPIVACAINE-EPINEPHRINE (PF) 0.25% -1:200000 IJ SOLN
INTRAMUSCULAR | Status: AC
  Filled 2014-09-15: qty 30

## 2014-09-15 MED ORDER — LIDOCAINE HCL (CARDIAC) 20 MG/ML IV SOLN
INTRAVENOUS | Status: DC | PRN
  Administered 2014-09-15: 50 mg via INTRAVENOUS

## 2014-09-15 MED ORDER — MIDAZOLAM HCL 2 MG/2ML IJ SOLN
INTRAMUSCULAR | Status: AC
  Filled 2014-09-15: qty 2

## 2014-09-15 MED ORDER — GLYCOPYRROLATE 0.2 MG/ML IJ SOLN
INTRAMUSCULAR | Status: DC | PRN
  Administered 2014-09-15: 0.6 mg via INTRAVENOUS

## 2014-09-15 MED ORDER — DOCUSATE SODIUM 100 MG PO CAPS
100.0000 mg | ORAL_CAPSULE | Freq: Two times a day (BID) | ORAL | Status: DC
  Administered 2014-09-15 – 2014-09-17 (×4): 100 mg via ORAL
  Filled 2014-09-15 (×4): qty 1

## 2014-09-15 MED ORDER — DEXTROSE 5 % IV SOLN
3.0000 g | INTRAVENOUS | Status: DC | PRN
  Administered 2014-09-15: 3 g via INTRAVENOUS

## 2014-09-15 MED ORDER — MIDAZOLAM HCL 5 MG/5ML IJ SOLN
INTRAMUSCULAR | Status: DC | PRN
  Administered 2014-09-15: 2 mg via INTRAVENOUS

## 2014-09-15 MED ORDER — NEOSTIGMINE METHYLSULFATE 10 MG/10ML IV SOLN
INTRAVENOUS | Status: DC | PRN
  Administered 2014-09-15: 5 mg via INTRAVENOUS

## 2014-09-15 MED ORDER — ACETAMINOPHEN 650 MG RE SUPP: 650.0000 mg | Freq: Four times a day (QID) | RECTAL | Status: DC | PRN

## 2014-09-15 MED ORDER — OXYCODONE HCL 5 MG PO TABS
5.0000 mg | ORAL_TABLET | ORAL | Status: DC | PRN
  Administered 2014-09-15 – 2014-09-16 (×5): 5 mg via ORAL
  Filled 2014-09-15 (×4): qty 1

## 2014-09-15 MED ORDER — SODIUM CHLORIDE 0.9 % IV SOLN
INTRAVENOUS | Status: DC
  Administered 2014-09-15 – 2014-09-16 (×2): via INTRAVENOUS

## 2014-09-15 MED ORDER — PROPOFOL 10 MG/ML IV BOLUS
INTRAVENOUS | Status: AC
  Filled 2014-09-15: qty 20

## 2014-09-15 MED ORDER — HYDROMORPHONE HCL 1 MG/ML IJ SOLN
0.2500 mg | INTRAMUSCULAR | Status: DC | PRN
  Administered 2014-09-15 (×4): 0.5 mg via INTRAVENOUS

## 2014-09-15 MED ORDER — PROMETHAZINE HCL 25 MG/ML IJ SOLN: 6.2500 mg | INTRAMUSCULAR | Status: DC | PRN

## 2014-09-15 MED ORDER — BUPIVACAINE-EPINEPHRINE 0.25% -1:200000 IJ SOLN
INTRAMUSCULAR | Status: DC | PRN
Start: 2014-09-15 — End: 2014-09-15
  Administered 2014-09-15: 10 mL

## 2014-09-15 MED ORDER — HEPARIN SODIUM (PORCINE) 5000 UNIT/ML IJ SOLN
5000.0000 [IU] | Freq: Three times a day (TID) | INTRAMUSCULAR | Status: DC
  Administered 2014-09-15 – 2014-09-17 (×5): 5000 [IU] via SUBCUTANEOUS
  Filled 2014-09-15 (×5): qty 1

## 2014-09-15 MED ORDER — HYDROMORPHONE HCL 1 MG/ML IJ SOLN
INTRAMUSCULAR | Status: AC
  Filled 2014-09-15: qty 2

## 2014-09-15 SURGICAL SUPPLY — 48 items
APPLIER CLIP LOGIC TI 5 (MISCELLANEOUS) IMPLANT
APPLIER CLIP ROT 10 11.4 M/L (STAPLE)
BENZOIN TINCTURE PRP APPL 2/3 (GAUZE/BANDAGES/DRESSINGS) IMPLANT
BNDG GAUZE ELAST 4 BULKY (GAUZE/BANDAGES/DRESSINGS) ×3 IMPLANT
CANISTER SUCTION 2500CC (MISCELLANEOUS) IMPLANT
CHLORAPREP W/TINT 26ML (MISCELLANEOUS) ×3 IMPLANT
CLIP APPLIE ROT 10 11.4 M/L (STAPLE) IMPLANT
CLOSURE WOUND 1/2 X4 (GAUZE/BANDAGES/DRESSINGS) ×1
COVER SURGICAL LIGHT HANDLE (MISCELLANEOUS) ×3 IMPLANT
DECANTER SPIKE VIAL GLASS SM (MISCELLANEOUS) IMPLANT
DEVICE RELIATACK FIXATION (MISCELLANEOUS) ×3 IMPLANT
DEVICE SECURE STRAP 25 ABSORB (INSTRUMENTS) ×3 IMPLANT
DEVICE TROCAR PUNCTURE CLOSURE (ENDOMECHANICALS) ×3 IMPLANT
DRAPE INCISE IOBAN 66X45 STRL (DRAPES) ×3 IMPLANT
DRAPE LAPAROSCOPIC ABDOMINAL (DRAPES) ×3 IMPLANT
ELECT REM PT RETURN 9FT ADLT (ELECTROSURGICAL) ×3
ELECTRODE REM PT RTRN 9FT ADLT (ELECTROSURGICAL) ×1 IMPLANT
GLOVE BIO SURGEON STRL SZ7 (GLOVE) ×9 IMPLANT
GLOVE BIO SURGEON STRL SZ7.5 (GLOVE) ×3 IMPLANT
GLOVE BIOGEL PI IND STRL 7.0 (GLOVE) ×2 IMPLANT
GLOVE BIOGEL PI IND STRL 7.5 (GLOVE) ×1 IMPLANT
GLOVE BIOGEL PI INDICATOR 7.0 (GLOVE) ×4
GLOVE BIOGEL PI INDICATOR 7.5 (GLOVE) ×2
GOWN STRL REUS W/ TWL LRG LVL3 (GOWN DISPOSABLE) ×4 IMPLANT
GOWN STRL REUS W/TWL LRG LVL3 (GOWN DISPOSABLE) ×8
KIT BASIN OR (CUSTOM PROCEDURE TRAY) ×3 IMPLANT
KIT ROOM TURNOVER OR (KITS) ×3 IMPLANT
LIQUID BAND (GAUZE/BANDAGES/DRESSINGS) ×3 IMPLANT
MARKER SKIN DUAL TIP RULER LAB (MISCELLANEOUS) ×3 IMPLANT
MESH VENTRALIGHT ST 6IN CRC (Mesh General) ×3 IMPLANT
NEEDLE SPNL 22GX3.5 QUINCKE BK (NEEDLE) ×3 IMPLANT
NS IRRIG 1000ML POUR BTL (IV SOLUTION) ×3 IMPLANT
PAD ARMBOARD 7.5X6 YLW CONV (MISCELLANEOUS) ×6 IMPLANT
SCALPEL HARMONIC ACE (MISCELLANEOUS) IMPLANT
SCISSORS LAP 5X35 DISP (ENDOMECHANICALS) IMPLANT
SET IRRIG TUBING LAPAROSCOPIC (IRRIGATION / IRRIGATOR) IMPLANT
SLEEVE ENDOPATH XCEL 5M (ENDOMECHANICALS) ×3 IMPLANT
STRIP CLOSURE SKIN 1/2X4 (GAUZE/BANDAGES/DRESSINGS) ×2 IMPLANT
SUT MNCRL AB 4-0 PS2 18 (SUTURE) ×3 IMPLANT
SUT PROLENE 0 CTX CR/8 (SUTURE) ×3 IMPLANT
TOWEL OR 17X24 6PK STRL BLUE (TOWEL DISPOSABLE) IMPLANT
TOWEL OR 17X26 10 PK STRL BLUE (TOWEL DISPOSABLE) ×3 IMPLANT
TRAY FOLEY CATH 14FRSI W/METER (CATHETERS) ×3 IMPLANT
TRAY LAPAROSCOPIC (CUSTOM PROCEDURE TRAY) ×3 IMPLANT
TROCAR XCEL BLUNT TIP 100MML (ENDOMECHANICALS) IMPLANT
TROCAR XCEL NON-BLD 11X100MML (ENDOMECHANICALS) ×3 IMPLANT
TROCAR XCEL NON-BLD 5MMX100MML (ENDOMECHANICALS) ×3 IMPLANT
TUBING INSUFFLATION (TUBING) ×3 IMPLANT

## 2014-09-15 NOTE — Anesthesia Postprocedure Evaluation (Signed)
  Anesthesia Post-op Note  Patient: Tiffany Glover  Procedure(s) Performed: Procedure(s): LAPAROSCOPIC INCISIONAL HERNIA REPAIR WITH MESH (N/A)  Patient Location: PACU  Anesthesia Type:General  Level of Consciousness: awake  Airway and Oxygen Therapy: Patient Spontanous Breathing  Post-op Pain: mild  Post-op Assessment: Post-op Vital signs reviewed  Post-op Vital Signs: Reviewed  Last Vitals:  Filed Vitals:   09/15/14 1608  BP: 117/42  Pulse: 61  Temp: 36.8 C  Resp: 15    Complications: No apparent anesthesia complications

## 2014-09-15 NOTE — Progress Notes (Signed)
Orthopedic Tech Progress Note Patient Details:  Eilene GhaziRhonda C Venuto 1961-06-17 [id number] Assisted pt.'s nurse with placement of abdominal binder. Ortho Devices Type of Ortho Device: Abdominal binder Ortho Device/Splint Interventions: Application   Lesle ChrisGilliland, Toniette Devera L 09/15/2014, 2:12 PM

## 2014-09-15 NOTE — Interval H&P Note (Signed)
History and Physical Interval Note:  09/15/2014 10:54 AM  Tiffany Glover  has presented today for surgery, with the diagnosis of INCISIONAL HERNIA  The various methods of treatment have been discussed with the patient and family. After consideration of risks, benefits and other options for treatment, the patient has consented to  Procedure(s): LAPAROSCOPIC INCISIONAL HERNIA REPAIR WITH MESH (N/A) as a surgical intervention .  The patient's history has been reviewed, patient examined, no change in status, stable for surgery.  I have reviewed the patient's chart and labs.  Questions were answered to the patient's satisfaction.     [name]

## 2014-09-15 NOTE — Op Note (Signed)
Preoperative diagnosis: Incisional hernia Postoperative diagnosis: Same as above Procedure: Laparoscopic incisional hernia repair with mesh Surgeon: Dr. Harden MoMatt Vahe Pienta Anesthesia: Gen. Estimated blood loss: Minimal Specimens: None Drains: None Special callus correct at completion Disposition to recovery in stable condition  Indications: This is a 54 year old female who I know well from a cholecystectomy. She has an incisional hernia at her umbilical site. We discussed a laparoscopic repair with mesh.  Procedure: After informed consent was obtained the patient was taken to the operating room. She was given cefazolin. Sequential compression devices were on her legs. She was placed under general anesthesia without complication. Foley catheter was placed. An orogastric tube was placed in her stomach was evacuated. She was then prepped and draped in the standard sterile surgical fashion. Collier Flowersoban was placed over her abdomen. Surgical timeout was then performed.  I infiltrated Marcaine in her left upper quadrant. I then made an incision. I accessed her peritoneum using the Optiview technique without any evidence of injury. I then insufflated the abdomen. I then inserted an 11 mm trocar in the left midabdomen as well as a left lower quadrant trocar without complication. She had omentum that was incarcerated in the hernia that was reduced with a combination of blunt dissection and scissors. Once I done this it was about a centimeter and a half hernia. I placed a 15 cm around the ventralight mesh into the abdomen after placing 4 Prolene sutures around the mesh. I then used the Endo Close device to pull the sutures up to completely cover the defect with good overlap. I then tied the sutures down. I then used the tacker to secure the edges. This was good upon completion. This covered the defect well. There was hemostasis. I then removed via the 11 mm trocar. I used the Endo Close device and a 0 Vicryl to close  this. I then desufflated the abdomen and removed my remaining trocars. The incisions were closed 4-0 Monocryl and glue. Steri-Strips were placed. She tolerated this well was extubated and transferred to the recovery room in stable condition.

## 2014-09-15 NOTE — Progress Notes (Signed)
Pt arrived to 6N22 from PACU, VSS.  No complaints at this time.  Incisions assessed and abdominal binder placed.  Incisions CDI.  Pt oriented to unit and staff.  Will continue to monitor and carry out orders. Sherald BargeSpencer, Anusha Claus T

## 2014-09-15 NOTE — Anesthesia Procedure Notes (Signed)
Procedure Name: Intubation Date/Time: 09/15/2014 12:20 PM Performed by: Adonis HousekeeperNGELL, Alys Dulak M Pre-anesthesia Checklist: Patient identified, Emergency Drugs available and Suction available Patient Re-evaluated:Patient Re-evaluated prior to inductionOxygen Delivery Method: Circle system utilized Preoxygenation: Pre-oxygenation with 100% oxygen Intubation Type: IV induction Ventilation: Mask ventilation without difficulty and Oral airway inserted - appropriate to patient size Laryngoscope Size: Mac and 4 Grade View: Grade II Tube type: Oral Tube size: 7.0 mm Number of attempts: 1 Airway Equipment and Method: Stylet Placement Confirmation: ETT inserted through vocal cords under direct vision,  breath sounds checked- equal and bilateral and positive ETCO2 Secured at: 21 cm Tube secured with: Tape Dental Injury: Teeth and Oropharynx as per pre-operative assessment

## 2014-09-15 NOTE — Progress Notes (Signed)
Lunch relief by L. Yontz RN 

## 2014-09-15 NOTE — Transfer of Care (Signed)
Immediate Anesthesia Transfer of Care Note  Patient: Tiffany GhaziRhonda C Dilauro  Procedure(s) Performed: Procedure(s): LAPAROSCOPIC INCISIONAL HERNIA REPAIR WITH MESH (N/A)  Patient Location: PACU  Anesthesia Type:General  Level of Consciousness: awake, alert , oriented and patient cooperative  Airway & Oxygen Therapy: Patient Spontanous Breathing and Patient connected to face mask oxygen  Post-op Assessment: Report given to RN, Post -op Vital signs reviewed and stable, Patient moving all extremities and Patient moving all extremities X 4  Post vital signs: Reviewed and stable  Last Vitals:  Filed Vitals:   09/15/14 0938  BP: 146/64  Pulse: 72  Temp: 36.7 C  Resp: 20    Complications: No apparent anesthesia complications

## 2014-09-15 NOTE — Discharge Instructions (Signed)
[facility] LAPAROSCOPIC SURGERY: POST OP INSTRUCTIONS  Always review your discharge instruction sheet given to you by the facility where your surgery was performed. IF YOU HAVE DISABILITY OR FAMILY LEAVE FORMS, YOU MUST BRING THEM TO THE OFFICE FOR PROCESSING.   DO NOT GIVE THEM TO YOUR DOCTOR.  1. A prescription for pain medication may be given to you upon discharge.  Take your pain medication as prescribed, if needed.  If narcotic pain medicine is not needed, then you may take acetaminophen (Tylenol), naprosyn (Alleve), or ibuprofen (Advil) as needed. 2. Take your usually prescribed medications unless otherwise directed. 3. If you need a refill on your pain medication, please contact your pharmacy.  They will contact our office to request authorization. Prescriptions will not be filled after 5pm or on week-ends. 4. You should follow a light diet the first few days after arrival home, such as soup and crackers, etc.  Be sure to include lots of fluids daily. 5. Most patients will experience some swelling and bruising in the area of the incisions.  Ice packs will help.  Swelling and bruising can take several days to resolve.  6. It is common to experience some constipation if taking pain medication after surgery.  Increasing fluid intake and taking a stool softener (such as Colace) will usually help or prevent this problem from occurring.  A mild laxative (Milk of Magnesia or Miralax) should be taken according to package instructions if there are no bowel movements after 48 hours. 7. Unless discharge instructions indicate otherwise, you may remove your bandages 48 hours after surgery, and you may shower at that time.  You may have steri-strips (small skin tapes) in place directly over the incision.  These strips should be left on the skin for 7-10 days.  If your surgeon used skin glue on the incision, you may shower in 24 hours.  The glue will flake  off over the next 2-3 weeks.  Any sutures or staples will be removed at the office during your follow-up visit. 8. ACTIVITIES:  You may resume regular (light) daily activities beginning the next day--such as daily self-care, walking, climbing stairs--gradually increasing activities as tolerated.  You may have sexual intercourse when it is comfortable.  Refrain from any heavy lifting or straining until approved by your doctor. a. You may drive when you are no longer taking prescription pain medication, you can comfortably wear a seatbelt, and you can safely maneuver your car and apply brakes. b. RETURN TO WORK:  __________________________________________________________ 9. You should see your doctor in the office for a follow-up appointment approximately 2-3 weeks after your surgery.  Make sure that you call for this appointment within a day or two after you arrive home to insure a convenient appointment time. 10. OTHER INSTRUCTIONS: __________________________________________________________________________________________________________________________ __________________________________________________________________________________________________________________________ WHEN TO CALL YOUR DOCTOR: 1. Fever over 101.0 2. Inability to urinate 3. Continued bleeding from incision. 4. Increased pain, redness, or drainage from the incision. 5. Increasing abdominal pain  The clinic staff is available to answer your questions during regular business hours.  Please don't hesitate to call and ask to speak to one of the nurses for clinical concerns.  If you have a medical emergency, go to the nearest emergency room or call 911.  A surgeon from [facility] is always on call at the hospital. [address], [address], [place], [place]  [address] ? [address] [address], [place], [place]   [address] [phone] ? [phone] ? [phone]   [phone] Web site: [email or web address]  

## 2014-09-15 NOTE — H&P (Signed)
  [age] yof I know well from attempted lap chole converted to open chole in January 2014. She has done well except for diarrhea and she saw Dr Christella HartiganJacobs. Had been on [place] but than had variable constipation and diarrhea so she has stopped that. In October she had URI with a lot of coughing. At that time noted that she had umbilical pain and a bulge. This has remained about the same. Worse with standing. She has some occasional nausea after eating. no emesis. She was seen by her gyn and appears to have umbilical incisional hernia   Other Problems  Back Pain Diverticulosis Kidney Stone Vascular Disease  Past Surgical History Cesarean Section - Multiple Gallbladder Surgery - Open  Diagnostic Studies History Colonoscopy 5-10 years ago Mammogram within last year Pap Smear 1-5 years ago  Allergies  No Known Drug Allergies02/01/2015  Medication History  Vitamin D (2000UNIT Tablet, Oral) Active. Multivitamins (Oral) Active. Medications Reconciled  Social History  Alcohol use Occasional alcohol use. Caffeine use Coffee, Tea. No drug use Tobacco use Former smoker.  Family History  Alcohol Abuse Brother, Father. Arthritis Family Members In General. Cancer Brother, Mother.  Review of Systems  General Present- Fatigue. Not Present- Appetite Loss, Chills, Fever, Night Sweats, Weight Gain and Weight Loss. Skin Present- Dryness. Not Present- Change in Wart/Mole, Hives, Jaundice, New Lesions, Non-Healing Wounds, Rash and Ulcer. HEENT Present- Seasonal Allergies and Wears glasses/contact lenses. Not Present- Earache, Hearing Loss, Hoarseness, Nose Bleed, Oral Ulcers, Ringing in the Ears, Sinus Pain, Sore Throat, Visual Disturbances and Yellow Eyes. Respiratory Present- Snoring. Not Present- Bloody sputum, Chronic Cough, Difficulty Breathing and Wheezing. Breast Present- Breast Pain. Not Present- Breast Mass, Nipple Discharge and Skin Changes. Cardiovascular Present-  Swelling of Extremities. Not Present- Chest Pain, Difficulty Breathing Lying Down, Leg Cramps, Palpitations, Rapid Heart Rate and Shortness of Breath. Gastrointestinal Present- Abdominal Pain and Bloating. Not Present- Bloody Stool, Change in Bowel Habits, Chronic diarrhea, Constipation, Difficulty Swallowing, Excessive gas, Gets full quickly at meals, Hemorrhoids, Indigestion, Nausea, Rectal Pain and Vomiting. Female Genitourinary Present- Frequency and Urgency. Not Present- Nocturia, Painful Urination and Pelvic Pain. Musculoskeletal Present- Back Pain, Joint Stiffness and Muscle Pain. Not Present- Joint Pain, Muscle Weakness and Swelling of Extremities. Neurological Not Present- Decreased Memory, Fainting, Headaches, Numbness, Seizures, Tingling, Tremor, Trouble walking and Weakness. Psychiatric Not Present- Anxiety, Bipolar, Change in Sleep Pattern, Depression, Fearful and Frequent crying. Endocrine Present- Hair Changes. Not Present- Cold Intolerance, Excessive Hunger, Heat Intolerance, Hot flashes and New Diabetes. Hematology Not Present- Easy Bruising, Excessive bleeding, Gland problems, HIV and Persistent Infections.   Vitals  Weight: 265 lb Height: 64in Body Surface Area: 2.33 m Body Mass Index: 45.49 kg/m Pulse: 74 (Regular)  BP: 124/74 (Sitting, Left Arm, Standard) Physical Exam  Abdomen Note: soft nt/nd Healed ruq incision and umbilical with periumbilical hernia, soft mild tender cv rrr pulm clear bilaterally  INCISIONAL HERNIA (553.21  K43.2) Story: Laparoscopic repair with mesh We discussed laparoscopic repair with mesh given her symptoms and risk of incarceration. Discussed surgery, recovery and restrictions. Risks include but are not limited to bleeding, infection, open procedure, recurrence, injury to bowel requiring repair, dvt/pe. will proceed as soon as possible due to symptoms. We discussed weight loss and she has recently joined [employer].

## 2014-09-15 NOTE — Anesthesia Preprocedure Evaluation (Signed)
Anesthesia Evaluation  Patient identified by MRN, date of birth, ID band  History of Anesthesia Complications (+) history of anesthetic complications  Airway Mallampati: II  TM Distance: >3 FB Neck ROM: Full    Dental no notable dental hx.    Pulmonary former smoker,    Pulmonary exam normal       Cardiovascular negative cardio ROS      Neuro/Psych    GI/Hepatic   Endo/Other  Hypothyroidism   Renal/GU      Musculoskeletal   Abdominal   Peds  Hematology   Anesthesia Other Findings   Reproductive/Obstetrics                             Anesthesia Physical Anesthesia Plan  ASA: II  Anesthesia Plan: General   Post-op Pain Management:    Induction:   Airway Management Planned:   Additional Equipment:   Intra-op Plan:   Post-operative Plan:   Informed Consent:   Plan Discussed with:   Anesthesia Plan Comments:         Anesthesia Quick Evaluation

## 2014-09-16 ENCOUNTER — Encounter (HOSPITAL_COMMUNITY): Payer: Self-pay | Admitting: General Surgery

## 2014-09-16 DIAGNOSIS — K432 Incisional hernia without obstruction or gangrene: Secondary | ICD-10-CM | POA: Diagnosis not present

## 2014-09-16 MED ORDER — IBUPROFEN 600 MG PO TABS: 600.0000 mg | ORAL_TABLET | Freq: Three times a day (TID) | ORAL | Status: DC

## 2014-09-16 MED ORDER — KETOROLAC TROMETHAMINE 15 MG/ML IJ SOLN
30.0000 mg | Freq: Once | INTRAMUSCULAR | Status: AC
  Administered 2014-09-16: 30 mg via INTRAVENOUS
  Filled 2014-09-16: qty 2

## 2014-09-16 MED ORDER — OXYCODONE HCL 5 MG PO TABS: 5.0000 mg | ORAL_TABLET | ORAL | Status: DC | PRN

## 2014-09-16 MED ORDER — KETOROLAC TROMETHAMINE 15 MG/ML IJ SOLN
15.0000 mg | Freq: Three times a day (TID) | INTRAMUSCULAR | Status: DC | PRN
  Administered 2014-09-16: 15 mg via INTRAVENOUS
  Filled 2014-09-16: qty 1

## 2014-09-16 MED ORDER — OXYCODONE HCL 5 MG PO TABS
5.0000 mg | ORAL_TABLET | ORAL | Status: DC | PRN
  Administered 2014-09-16 – 2014-09-17 (×6): 10 mg via ORAL
  Filled 2014-09-16 (×6): qty 2

## 2014-09-16 MED ORDER — DOCUSATE SODIUM 100 MG PO CAPS: 100.0000 mg | ORAL_CAPSULE | Freq: Two times a day (BID) | ORAL | Status: DC

## 2014-09-16 MED ORDER — IBUPROFEN 600 MG PO TABS: 600.0000 mg | ORAL_TABLET | Freq: Four times a day (QID) | ORAL | Status: DC | PRN

## 2014-09-16 NOTE — Progress Notes (Signed)
UR completed 

## 2014-09-16 NOTE — Progress Notes (Signed)
1 Day Post-Op  Subjective: Sore, tol clears, has been up to bathroom  Objective: Vital signs in last 24 hours: Temp:  [97.3 F (36.3 C)-99 F (37.2 C)] 99 F (37.2 C) (03/18 0501) Pulse Rate:  [57-79] 79 (03/18 0501) Resp:  [9-18] 17 (03/18 0501) BP: (87-132)/(37-119) 122/66 mmHg (03/18 0501) SpO2:  [92 %-100 %] 99 % (03/18 0501) Weight:  [121.564 kg (268 lb)] 121.564 kg (268 lb) (03/17 1608) Last BM Date: 09/14/14  Intake/Output from previous day: 03/17 0701 - 03/18 0700 In: 2631.3 [I.V.:2231.3] Out: 875 [Urine:875] Intake/Output this shift: Total I/O In: 240 [P.O.:240] Out: 650 [Urine:650]  GI: approp tender  Lab Results:  No results for input(s): WBC, HGB, HCT, PLT in the last 72 hours. BMET No results for input(s): NA, K, CL, CO2, GLUCOSE, BUN, CREATININE, CALCIUM in the last 72 hours. PT/INR No results for input(s): LABPROT, INR in the last 72 hours. ABG No results for input(s): PHART, HCO3 in the last 72 hours.  Invalid input(s): PCO2, PO2  Studies/Results: No results found.  Anti-infectives: Anti-infectives    Start     Dose/Rate Route Frequency Ordered Stop   09/15/14 0600  ceFAZolin (ANCEF) 3 g in dextrose 5 % 50 mL IVPB  Status:  Discontinued     3 g 160 mL/hr over 30 Minutes Intravenous On call to O.R. 09/14/14 1313 09/15/14 1555      Assessment/Plan: POD 1 lap incisional hernia repair  Advance diet If pain better controlled and ambulates can dc home today  [facility],Amen Dargis 09/16/2014

## 2014-09-17 DIAGNOSIS — K432 Incisional hernia without obstruction or gangrene: Secondary | ICD-10-CM | POA: Diagnosis not present

## 2014-09-17 LAB — BASIC METABOLIC PANEL
ANION GAP: 6 (ref 5–15)
BUN: 8 mg/dL (ref 6–23)
CHLORIDE: 104 mmol/L (ref 96–112)
CO2: 27 mmol/L (ref 19–32)
CREATININE: 0.54 mg/dL (ref 0.50–1.10)
Calcium: 8.5 mg/dL (ref 8.4–10.5)
Glucose, Bld: 106 mg/dL — ABNORMAL HIGH (ref 70–99)
POTASSIUM: 3.6 mmol/L (ref 3.5–5.1)
SODIUM: 137 mmol/L (ref 135–145)

## 2014-09-17 MED ORDER — IBUPROFEN 600 MG PO TABS
600.0000 mg | ORAL_TABLET | Freq: Three times a day (TID) | ORAL | Status: DC
  Administered 2014-09-17: 600 mg via ORAL
  Filled 2014-09-17: qty 1

## 2014-09-17 NOTE — Progress Notes (Signed)
IV removed per order. Discharge instructions and prescription given and explained with teachback. Discharged via wheelchair to husband's care with NT present. Trina Aoarla Loetta Connelley, RN

## 2014-09-17 NOTE — Progress Notes (Signed)
2 Days Post-Op  Subjective: Small amt flatus, tol diet, pain much better, voiding, ambulating  Objective: Vital signs in last 24 hours: Temp:  [97.5 F (36.4 C)-98.9 F (37.2 C)] 98.6 F (37 C) (03/19 0525) Pulse Rate:  [76-86] 86 (03/19 0525) Resp:  [16-20] 17 (03/19 0525) BP: (114-134)/(49-70) 134/58 mmHg (03/19 0525) SpO2:  [93 %-99 %] 93 % (03/18 2050) Last BM Date: 09/14/14  Intake/Output from previous day: 03/18 0701 - 03/19 0700 In: 959.3 [P.O.:480; I.V.:479.3] Out: 1200 [Urine:1200] Intake/Output this shift:    GI: soft approp tender incisions clean bs present  Lab Results:  No results for input(s): WBC, HGB, HCT, PLT in the last 72 hours. BMET  Recent Labs  09/17/14 0447  NA 137  K 3.6  CL 104  CO2 27  GLUCOSE 106*  BUN 8  CREATININE 0.54  CALCIUM 8.5   PT/INR No results for input(s): LABPROT, INR in the last 72 hours. ABG No results for input(s): PHART, HCO3 in the last 72 hours.  Invalid input(s): PCO2, PO2  Studies/Results: No results found.  Anti-infectives: Anti-infectives    Start     Dose/Rate Route Frequency Ordered Stop   09/15/14 0600  ceFAZolin (ANCEF) 3 g in dextrose 5 % 50 mL IVPB  Status:  Discontinued     3 g 160 mL/hr over 30 Minutes Intravenous On call to O.R. 09/14/14 1313 09/15/14 1555      Assessment/Plan: POD 2 lap incisional hernia repair with mesh  1. Po pain meds with motrin 2. pulm toilet 3. Regular diet 4. If does well this am plan dc home  [facility],Alson Mcpheeters 09/17/2014

## 2014-09-17 NOTE — Discharge Summary (Signed)
Physician Discharge Summary  Patient ID: Tiffany GhaziRhonda C Glover MRN: [id number] DOB/AGE: 01-19-1961 54 y.o.  Admit date: 09/15/2014 Discharge date: 09/17/2014  Admission Diagnoses: Incisional hernia  Discharge Diagnoses:  Active Problems:   Incisional hernia   Discharged Condition: good  Hospital Course: [age] yof who underwent laparoscopic repair of ventral hernia without complication. She was kept until pain was controlled with oral medication, ambulating and pain was controlled. She was discharged home  Consults: None  Significant Diagnostic Studies: none   Treatments: surgery: lap vh repair   Disposition: 01-Home or Self Care     Medication List    TAKE these medications        azithromycin 250 MG tablet  Commonly known as:  ZITHROMAX  Use as a zpack     cholecalciferol 1000 UNITS tablet  Commonly known as:  VITAMIN D  Take 5,000 Units by mouth daily.     cholestyramine 4 GM/DOSE powder  Commonly known as:  QUESTRAN  Take 1 packet (4 g total) by mouth 2 (two) times daily with a meal.     docusate sodium 100 MG capsule  Commonly known as:  COLACE  Take 1 capsule (100 mg total) by mouth 2 (two) times daily.     HYDROcodone-homatropine 5-1.5 MG/5ML syrup  Commonly known as:  HYCODAN  Take 5 mLs by mouth every 8 (eight) hours as needed for cough.     ibuprofen 600 MG tablet  Commonly known as:  ADVIL,MOTRIN  Take 1 tablet (600 mg total) by mouth 3 (three) times daily. For one week postop then take as needed     ipratropium 0.03 % nasal spray  Commonly known as:  ATROVENT  Place 2 sprays into the nose 4 (four) times daily.     multivitamin tablet  Take 1 tablet by mouth daily.     oxyCODONE 5 MG immediate release tablet  Commonly known as:  Oxy IR/ROXICODONE  Take 1-2 tablets (5-10 mg total) by mouth every 4 (four) hours as needed for moderate pain.         SignedEmelia Loron: Mostafa Yuan 09/17/2014, 6:06 PM

## 2015-01-19 LAB — HM MAMMOGRAPHY

## 2015-08-28 LAB — HM PAP SMEAR: HM PAP: NEGATIVE

## 2015-09-05 ENCOUNTER — Ambulatory Visit (INDEPENDENT_AMBULATORY_CARE_PROVIDER_SITE_OTHER): Payer: BC Managed Care – PPO | Admitting: Family Medicine

## 2015-09-05 VITALS — BP 122/80 | HR 97 | Temp 98.3°F | Resp 16 | Ht 64.0 in | Wt 258.0 lb

## 2015-09-05 DIAGNOSIS — J069 Acute upper respiratory infection, unspecified: Secondary | ICD-10-CM | POA: Diagnosis not present

## 2015-09-05 DIAGNOSIS — B9789 Other viral agents as the cause of diseases classified elsewhere: Principal | ICD-10-CM

## 2015-09-05 MED ORDER — HYDROCOD POLST-CPM POLST ER 10-8 MG/5ML PO SUER: 5.0000 mL | Freq: Two times a day (BID) | ORAL | Status: DC | PRN

## 2015-09-05 NOTE — Patient Instructions (Signed)
For muscle aches, headache, sore throat you can take over the counter acetaminophen or ibuprofen as directed on the package For nasal congestion you can use Afrin nasal spray twice a day for up to 3 days, and /or sudafed, and/or saline nasal spray For cough you can use Delsym cough syrup Please come back to see us or go to the emergency department if you are not better in 5 to 7 days or if you develop fever over 101 for more than 48 hours or if you develop wheezing or shortness of breath.   

## 2015-09-05 NOTE — Progress Notes (Signed)
Subjective:    Patient ID: Tiffany GhaziRhonda C Yuan, female    DOB: 07-27-1960, 55 y.o.   MRN: [id number]  HPI This is a pleasant 55 yo female who presents today with 4 days of sudden onset headache, left ear pressure, muscle aches, fever to 100. Felt a little better yesterday. Cough occasionally productive of small amount yellow sputum. Non smoker. No past or present history of asthma. Took some tylenol without relief.   Past Medical History  Diagnosis Date  . Hypothyroidism   . Complication of anesthesia     hard to wake up once  . Obesity (BMI 30-39.9) 07/25/2012  . Morbid obesity with BMI of 40.0-44.9, adult (HCC)   . Ventral incisional hernia   . Mild stress incontinence    Past Surgical History  Procedure Laterality Date  . Cesarean section    . Cesarean section    . Foot surgery      ganglion cyst removed from right foot  . Vein strippiing      left leg  . Cholecystectomy  07/21/2012    Procedure: LAPAROSCOPIC CHOLECYSTECTOMY WITH INTRAOPERATIVE CHOLANGIOGRAM;  Surgeon: Emelia LoronMatthew Wakefield, MD;  Location: [facility];  Service: General;  Laterality: N/A;  attempted  . Cholecystectomy  07/21/2012    Procedure: CHOLECYSTECTOMY;  Surgeon: Emelia LoronMatthew Wakefield, MD;  Location: [facility];  Service: General;  Laterality: N/A;  . Incisional hernia repair N/A 09/15/2014    Procedure: LAPAROSCOPIC INCISIONAL HERNIA REPAIR WITH MESH;  Surgeon: Emelia LoronMatthew Wakefield, MD;  [facility];  Service: General;  Laterality: N/A;   Family History  Problem Relation Age of Onset  . Urolithiasis Son   . Cancer Mother   . Cancer Brother   . Cancer Maternal Grandmother    Social History  Substance Use Topics  . Smoking status: Former Smoker -- 1.50 packs/day for 20 years    Quit date: 07/20/1994  . Smokeless tobacco: Never Used  . Alcohol Use: Yes     Comment: Occassional Use      Review of Systems  Constitutional: Positive for fever and fatigue.  HENT: Positive for congestion, ear pain (pressure, left) and  sinus pressure.   Respiratory: Positive for cough. Negative for shortness of breath and wheezing.   Neurological: Positive for headaches.       Objective:   Physical Exam  Constitutional: She is oriented to person, place, and time. She appears well-developed and well-nourished. No distress.  HENT:  Head: Normocephalic and atraumatic.  Right Ear: External ear and ear canal normal. Tympanic membrane is scarred.  Left Ear: Tympanic membrane, external ear and ear canal normal.  Nose: Mucosal edema and rhinorrhea present.  Mouth/Throat: Uvula is midline and oropharynx is clear and moist.  Eyes: Conjunctivae are normal.  Neck: Normal range of motion. Neck supple.  Cardiovascular: Normal rate, regular rhythm and normal heart sounds.   Pulmonary/Chest: Effort normal.  Neurological: She is alert and oriented to person, place, and time.  Skin: Skin is warm and dry. She is not diaphoretic.  Psychiatric: She has a normal mood and affect. Her behavior is normal. Judgment and thought content normal.  Vitals reviewed.     BP 122/80 mmHg  Pulse 97  Temp(Src) 98.3 F (36.8 C) (Oral)  Resp 16  Ht 5\' 4"  (1.626 m)  Wt 258 lb (117.028 kg)  BMI 44.26 kg/m2  SpO2 98%  LMP 09/08/2014 (Within Days) Wt Readings from Last 3 Encounters:  09/05/15 258 lb (117.028 kg)  09/15/14 268 lb (121.564 kg)  09/08/14 264 lb (119.75 kg)       Assessment & Plan:  1. Viral URI with cough - Provided written and verbal information regarding diagnosis and treatment. -  Patient Instructions   For muscle aches, headache, sore throat you can take over the counter acetaminophen or ibuprofen as directed on the package For nasal congestion you can use Afrin nasal spray twice a day for up to 3 days, and /or sudafed, and/or saline nasal spray For cough you can use Delsym cough syrup Please come back to see [place] or go to the emergency department if you are not better in 5 to 7 days or if you develop fever over 101 for  more than 48 hours or if you develop wheezing or shortness of breath.    - chlorpheniramine-HYDROcodone (TUSSIONEX PENNKINETIC ER) 10-8 MG/5ML SUER; Take 5 mLs by mouth every 12 (twelve) hours as needed for cough.  Dispense: 70 mL; Refill: 0   Olean Ree, FNP-BC  [facility], [facility]  09/05/2015 8:53 AM

## 2016-02-13 ENCOUNTER — Encounter: Payer: Self-pay | Admitting: Family Medicine

## 2016-02-13 ENCOUNTER — Ambulatory Visit (INDEPENDENT_AMBULATORY_CARE_PROVIDER_SITE_OTHER): Payer: BC Managed Care – PPO | Admitting: Family Medicine

## 2016-02-13 VITALS — BP 152/98 | HR 87 | Ht 64.0 in | Wt 284.0 lb

## 2016-02-13 DIAGNOSIS — L301 Dyshidrosis [pompholyx]: Secondary | ICD-10-CM

## 2016-02-13 DIAGNOSIS — R030 Elevated blood-pressure reading, without diagnosis of hypertension: Secondary | ICD-10-CM

## 2016-02-13 DIAGNOSIS — E785 Hyperlipidemia, unspecified: Secondary | ICD-10-CM

## 2016-02-13 DIAGNOSIS — Z91199 Patient's noncompliance with other medical treatment and regimen due to unspecified reason: Secondary | ICD-10-CM

## 2016-02-13 DIAGNOSIS — Z87891 Personal history of nicotine dependence: Secondary | ICD-10-CM

## 2016-02-13 DIAGNOSIS — E038 Other specified hypothyroidism: Secondary | ICD-10-CM | POA: Diagnosis not present

## 2016-02-13 DIAGNOSIS — R7301 Impaired fasting glucose: Secondary | ICD-10-CM

## 2016-02-13 DIAGNOSIS — Z9119 Patient's noncompliance with other medical treatment and regimen: Secondary | ICD-10-CM

## 2016-02-13 MED ORDER — TRIAMCINOLONE ACETONIDE 0.5 % EX CREA: 1.0000 "application " | TOPICAL_CREAM | Freq: Two times a day (BID) | CUTANEOUS | 1 refills | Status: DC

## 2016-02-13 NOTE — Patient Instructions (Addendum)
     Mediterranean Diet  Why follow it? Research shows. . Those who follow the Mediterranean diet have a reduced risk of heart disease  . The diet is associated with a reduced incidence of Parkinson's and Alzheimer's diseases . People following the diet may have longer life expectancies and lower rates of chronic diseases  . The Dietary Guidelines for Americans recommends the Mediterranean diet as an eating plan to promote health and prevent disease  What Is the Mediterranean Diet?  . Healthy eating plan based on typical foods and recipes of Mediterranean-style cooking . The diet is primarily a plant based diet; these foods should make up a majority of meals   Starches - Plant based foods should make up a majority of meals - They are an important sources of vitamins, minerals, energy, antioxidants, and fiber - Choose whole grains, foods high in fiber and minimally processed items  - Typical grain sources include wheat, oats, barley, corn, [place] rice, bulgar, farro, millet, polenta, couscous  - Various types of beans include chickpeas, lentils, fava beans, black beans, white beans   Fruits  Veggies - Large quantities of antioxidant rich fruits & veggies; 6 or more servings  - Vegetables can be eaten raw or lightly drizzled with oil and cooked  - Vegetables common to the traditional Mediterranean Diet include: artichokes, arugula, beets, broccoli, brussel sprouts, cabbage, carrots, celery, collard greens, cucumbers, eggplant, kale, leeks, lemons, lettuce, mushrooms, okra, onions, peas, peppers, potatoes, pumpkin, radishes, rutabaga, shallots, spinach, sweet potatoes, turnips, zucchini - Fruits common to the Mediterranean Diet include: apples, apricots, avocados, cherries, clementines, dates, figs, grapefruits, grapes, melons, nectarines, oranges, peaches, pears, pomegranates, strawberries, tangerines  Fats - Replace butter and margarine with healthy oils, such as olive oil, canola oil, and  tahini  - Limit nuts to no more than a handful a day  - Nuts include walnuts, almonds, pecans, pistachios, pine nuts  - Limit or avoid candied, honey roasted or heavily salted nuts - Olives are central to the Mediterranean diet - can be eaten whole or used in a variety of dishes   Meats Protein - Limiting red meat: no more than a few times a month - When eating red meat: choose lean cuts and keep the portion to the size of deck of cards - Eggs: approx. 0 to 4 times a week  - Fish and lean poultry: at least 2 a week  - Healthy protein sources include, chicken, turkey, lean beef, lamb - Increase intake of seafood such as tuna, salmon, trout, mackerel, shrimp, scallops - Avoid or limit high fat processed meats such as sausage and bacon  Dairy - Include moderate amounts of low fat dairy products  - Focus on healthy dairy such as fat free yogurt, skim milk, low or reduced fat cheese - Limit dairy products higher in fat such as whole or 2% milk, cheese, ice cream  Alcohol - Moderate amounts of red wine is ok  - No more than 5 oz daily for women (all ages) and men older than age 65  - No more than 10 oz of wine daily for men younger than 65  Other - Limit sweets and other desserts  - Use herbs and spices instead of salt to flavor foods  - Herbs and spices common to the traditional Mediterranean Diet include: basil, bay leaves, chives, cloves, cumin, fennel, garlic, lavender, marjoram, mint, oregano, parsley, pepper, rosemary, sage, savory, sumac, tarragon, thyme   It's not just a diet, it's   a lifestyle:  . The Mediterranean diet includes lifestyle factors typical of those in the region  . Foods, drinks and meals are best eaten with others and savored . Daily physical activity is important for overall good health . This could be strenuous exercise like running and aerobics . This could also be more leisurely activities such as walking, housework, yard-work, or taking the stairs . Moderation is  the key; a balanced and healthy diet accommodates most foods and drinks . Consider portion sizes and frequency of consumption of certain foods   Meal Ideas & Options:  . Breakfast:  o Whole wheat toast or whole wheat English muffins with peanut butter & hard boiled egg o Steel cut oats topped with apples & cinnamon and skim milk  o Fresh fruit: banana, strawberries, melon, berries, peaches  o Smoothies: strawberries, bananas, greek yogurt, peanut butter o Low fat greek yogurt with blueberries and granola  o Egg white omelet with spinach and mushrooms o Breakfast couscous: whole wheat couscous, apricots, skim milk, cranberries  . Sandwiches:  o Hummus and grilled vegetables (peppers, zucchini, squash) on whole wheat bread   o Grilled chicken on whole wheat pita with lettuce, tomatoes, cucumbers or tzatziki  o Tuna salad on whole wheat bread: tuna salad made with greek yogurt, olives, red peppers, capers, green onions o Garlic rosemary lamb pita: lamb sauted with garlic, rosemary, salt & pepper; add lettuce, cucumber, greek yogurt to pita - flavor with lemon juice and black pepper  . Seafood:  o Mediterranean grilled salmon, seasoned with garlic, basil, parsley, lemon juice and black pepper o Shrimp, lemon, and spinach whole-grain pasta salad made with low fat greek yogurt  o Seared scallops with lemon orzo  o Seared tuna steaks seasoned salt, pepper, coriander topped with tomato mixture of olives, tomatoes, olive oil, minced garlic, parsley, green onions and cappers  . Meats:  o Herbed greek chicken salad with kalamata olives, cucumber, feta  o Red bell peppers stuffed with spinach, bulgur, lean ground beef (or lentils) & topped with feta   o Kebabs: skewers of chicken, tomatoes, onions, zucchini, squash  o [place] burgers: made with red onions, mint, dill, lemon juice, feta cheese topped with roasted red peppers . Vegetarian o Cucumber salad: cucumbers, artichoke hearts, celery, red  onion, feta cheese, tossed in olive oil & lemon juice  o Hummus and whole grain pita points with a greek salad (lettuce, tomato, feta, olives, cucumbers, red onion) o Lentil soup with celery, carrots made with vegetable broth, garlic, salt and pepper  o Tabouli salad: parsley, bulgur, mint, scallions, cucumbers, tomato, radishes, lemon juice, olive oil, salt and pepper.    DASH Eating Plan DASH stands for "Dietary Approaches to Stop Hypertension." The DASH eating plan is a healthy eating plan that has been shown to reduce high blood pressure (hypertension). Additional health benefits may include reducing the risk of type 2 diabetes mellitus, heart disease, and stroke. The DASH eating plan may also help with weight loss. WHAT DO I NEED TO KNOW ABOUT THE DASH EATING PLAN? For the DASH eating plan, you will follow these general guidelines:  Choose foods with a percent daily value for sodium of less than 5% (as listed on the food label).  Use salt-free seasonings or herbs instead of table salt or sea salt.  Check with your health care provider or pharmacist before using salt substitutes.  Eat lower-sodium products, often labeled as "lower sodium" or "no salt added."  Eat fresh foods.  Eat more vegetables, fruits, and low-fat dairy products.  Choose whole grains. Look for the word "whole" as the first word in the ingredient list.  Choose fish and skinless chicken or [place] more often than red meat. Limit fish, poultry, and meat to 6 oz (170 g) each day.  Limit sweets, desserts, sugars, and sugary drinks.  Choose heart-healthy fats.  Limit cheese to 1 oz (28 g) per day.  Eat more home-cooked food and less restaurant, buffet, and fast food.  Limit fried foods.  Cook foods using methods other than frying.  Limit canned vegetables. If you do use them, rinse them well to decrease the sodium.  When eating at a restaurant, ask that your food be prepared with less salt, or no salt if  possible. WHAT FOODS CAN I EAT? Seek help from a dietitian for individual calorie needs. Grains Whole grain or whole wheat bread. Brown rice. Whole grain or whole wheat pasta. Quinoa, bulgur, and whole grain cereals. Low-sodium cereals. Corn or whole wheat flour tortillas. Whole grain cornbread. Whole grain crackers. Low-sodium crackers. Vegetables Fresh or frozen vegetables (raw, steamed, roasted, or grilled). Low-sodium or reduced-sodium tomato and vegetable juices. Low-sodium or reduced-sodium tomato sauce and paste. Low-sodium or reduced-sodium canned vegetables.  Fruits All fresh, canned (in natural juice), or frozen fruits. Meat and Other Protein Products Ground beef (85% or leaner), grass-fed beef, or beef trimmed of fat. Skinless chicken or [place]. Ground chicken or [place]. Pork trimmed of fat. All fish and seafood. Eggs. Dried beans, peas, or lentils. Unsalted nuts and seeds. Unsalted canned beans. Dairy Low-fat dairy products, such as skim or 1% milk, 2% or reduced-fat cheeses, low-fat ricotta or cottage cheese, or plain low-fat yogurt. Low-sodium or reduced-sodium cheeses. Fats and Oils Tub margarines without trans fats. Light or reduced-fat mayonnaise and salad dressings (reduced sodium). Avocado. Safflower, olive, or canola oils. Natural peanut or almond butter. Other Unsalted popcorn and pretzels. The items listed above may not be a complete list of recommended foods or beverages. Contact your dietitian for more options. WHAT FOODS ARE NOT RECOMMENDED? Grains White bread. White pasta. White rice. Refined cornbread. Bagels and croissants. Crackers that contain trans fat. Vegetables Creamed or fried vegetables. Vegetables in a cheese sauce. Regular canned vegetables. Regular canned tomato sauce and paste. Regular tomato and vegetable juices. Fruits Dried fruits. Canned fruit in light or heavy syrup. Fruit juice. Meat and Other Protein Products Fatty cuts of meat. Ribs, chicken  wings, bacon, sausage, bologna, salami, chitterlings, fatback, hot dogs, bratwurst, and packaged luncheon meats. Salted nuts and seeds. Canned beans with salt. Dairy Whole or 2% milk, cream, half-and-half, and cream cheese. Whole-fat or sweetened yogurt. Full-fat cheeses or blue cheese. Nondairy creamers and whipped toppings. Processed cheese, cheese spreads, or cheese curds. Condiments Onion and garlic salt, seasoned salt, table salt, and sea salt. Canned and packaged gravies. Worcestershire sauce. Tartar sauce. Barbecue sauce. Teriyaki sauce. Soy sauce, including reduced sodium. Steak sauce. Fish sauce. Oyster sauce. Cocktail sauce. Horseradish. Ketchup and mustard. Meat flavorings and tenderizers. Bouillon cubes. Hot sauce. Tabasco sauce. Marinades. Taco seasonings. Relishes. Fats and Oils Butter, stick margarine, lard, shortening, ghee, and bacon fat. Coconut, palm kernel, or palm oils. Regular salad dressings. Other Pickles and olives. Salted popcorn and pretzels. The items listed above may not be a complete list of foods and beverages to avoid. Contact your dietitian for more information. WHERE CAN I FIND MORE INFORMATION? National Heart, Lung, and Blood Institute: [employer]   This information is not intended  to replace advice given to you by your health care provider. Make sure you discuss any questions you have with your health care provider.   Document Released: 06/06/2011 Document Revised: 07/08/2014 Document Reviewed: 04/21/2013 Elsevier Interactive Patient Education 2016 [employer].       Hypertension Hypertension, commonly called high blood pressure, is when the force of blood pumping through your arteries is too strong. Your arteries are the blood vessels that carry blood from your heart throughout your body. A blood pressure reading consists of a higher number over a lower number, such as 110/72. The higher number (systolic) is the  pressure inside your arteries when your heart pumps. The lower number (diastolic) is the pressure inside your arteries when your heart relaxes. Ideally you want your blood pressure below 120/80. Hypertension forces your heart to work harder to pump blood. Your arteries may become narrow or stiff. Having untreated or uncontrolled hypertension can cause heart attack, stroke, kidney disease, and other problems. RISK FACTORS Some risk factors for high blood pressure are controllable. Others are not.  Risk factors you cannot control include:   Race. You may be at higher risk if you are African American.  Age. Risk increases with age.  Gender. Men are at higher risk than women before age 89 years. After age 19, women are at higher risk than men. Risk factors you can control include:  Not getting enough exercise or physical activity.  Being overweight.  Getting too much fat, sugar, calories, or salt in your diet.  Drinking too much alcohol. SIGNS AND SYMPTOMS Hypertension does not usually cause signs or symptoms. Extremely high blood pressure (hypertensive crisis) may cause headache, anxiety, shortness of breath, and nosebleed. DIAGNOSIS To check if you have hypertension, your health care provider will measure your blood pressure while you are seated, with your arm held at the level of your heart. It should be measured at least twice using the same arm. Certain conditions can cause a difference in blood pressure between your right and left arms. A blood pressure reading that is higher than normal on one occasion does not mean that you need treatment. If it is not clear whether you have high blood pressure, you may be asked to return on a different day to have your blood pressure checked again. Or, you may be asked to monitor your blood pressure at home for 1 or more weeks. TREATMENT Treating high blood pressure includes making lifestyle changes and possibly taking medicine. Living a healthy  lifestyle can help lower high blood pressure. You may need to change some of your habits. Lifestyle changes may include:  Following the DASH diet. This diet is high in fruits, vegetables, and whole grains. It is low in salt, red meat, and added sugars.  Keep your sodium intake below 2,300 mg per day.  Getting at least 30-45 minutes of aerobic exercise at least 4 times per week.  Losing weight if necessary.  Not smoking.  Limiting alcoholic beverages.  Learning ways to reduce stress. Your health care provider may prescribe medicine if lifestyle changes are not enough to get your blood pressure under control, and if one of the following is true:  You are 66-13 years of age and your systolic blood pressure is above 140.  You are 39 years of age or older, and your systolic blood pressure is above 150.  Your diastolic blood pressure is above 90.  You have diabetes, and your systolic blood pressure is over 140 or your diastolic  blood pressure is over 90.  You have kidney disease and your blood pressure is above 140/90.  You have heart disease and your blood pressure is above 140/90. Your personal target blood pressure may vary depending on your medical conditions, your age, and other factors. HOME CARE INSTRUCTIONS  Have your blood pressure rechecked as directed by your health care provider.   Take medicines only as directed by your health care provider. Follow the directions carefully. Blood pressure medicines must be taken as prescribed. The medicine does not work as well when you skip doses. Skipping doses also puts you at risk for problems.  Do not smoke.   Monitor your blood pressure at home as directed by your health care provider. SEEK MEDICAL CARE IF:   You think you are having a reaction to medicines taken.  You have recurrent headaches or feel dizzy.  You have swelling in your ankles.  You have trouble with your vision. SEEK IMMEDIATE MEDICAL CARE IF:  You  develop a severe headache or confusion.  You have unusual weakness, numbness, or feel faint.  You have severe chest or abdominal pain.  You vomit repeatedly.  You have trouble breathing. MAKE SURE YOU:   Understand these instructions.  Will watch your condition.  Will get help right away if you are not doing well or get worse.   This information is not intended to replace advice given to you by your health care provider. Make sure you discuss any questions you have with your health care provider.   Document Released: 06/17/2005 Document Revised: 11/01/2014 Document Reviewed: 04/09/2013 Elsevier Interactive Patient Education 2016 [employer] Folliculitis stomach subsequent numerous controversial can charts simvastatin done and exhausted I need to replenish myself by sleeping yes

## 2016-02-13 NOTE — Progress Notes (Signed)
New patient office visit note:  Impression and Recommendations:    1. Elevated blood pressure reading without diagnosis of hypertension   2. Dyshidrotic hand dermatitis   3. HLD (hyperlipidemia)   4. Elevated fasting blood sugar   5. Morbid obesity due to excess calories (HCC)   6. Other specified hypothyroidism   7. History of smoking 30 pack years- Quit 9/ 1/ 96   8. Personal history of noncompliance with medical treatment and regimen     -  Handouts provided on DASH diet, hypertension, Mediterranean diet.    - Encouraged patient she would like to follow-up to discuss weight-loss treatments and come up with a dietary plan that will work for her, I would be more than happy to do that with her.   Hypothyroidism We will need to obtain blood work near future. Counseling done  (BMI >= 40) Patient reports being unsatisfied with her weight.  rates her diet as poor.  Walks or swims 5 days a week of approximately 20 minutes.  Explained to patient what BMI refers to, and what it means medically.    Told patient to think about it as a "medical risk stratification measurement" and how increasing BMI is associated with increasing risk/ or worsening state of various diseases such as hypertension, hyperlipidemia, diabetes, premature OA, depression etc.  American Heart Association guidelines for healthy diet, basically Mediterranean diet, and exercise guidelines of 30 minutes 5 days per week or more discussed in detail.  Health counseling performed.  All questions answered.   HLD (hyperlipidemia) Explained to patient we will need blood work, fasting, near future  Elevated fasting blood sugar Obtain blood work near future, counseling done.  Dyshidrotic hand dermatitis Risk benefits of steroid cream discussed  Most important to avoid exposure to allergens and your dictating substances.  Suggest she do an elimination of her creams\massage oils and add one on each couple of weeks  to see which one could be causing the irritation.    Follow-up near future if no improvement with our treatment plan.   The patient understands if she does not stop with exposure, medications will not clear the rash but rather just keep it from getting worse.  Personal history of noncompliance with medical treatment and regimen Explained to patient importance of follow-up especially with her elevated blood pressure along with history of hyperlipidemia and hyperglycemia.    Elevated blood pressure reading without diagnosis of hypertension Explained to patient that back in March 2016 her blood pressure was 148/72 which was not within normal limits. Goal\normal blood pressure in someone her age should be 130/80 or less.  Extensive counseling due to detriments of elevated blood pressure on her health and well-being. Advised she monitor at home and keep a home log, write it down and bring in next office visit. This will have to be closely monitored  Dietary and lifestyle modifications discussed in detail. Handouts provided    No orders of the defined types were placed in this encounter.   Patient's Medications  New Prescriptions   TRIAMCINOLONE CREAM (KENALOG) 0.5 %    Apply 1 application topically 2 (two) times daily. To affected areas.  Previous Medications   MULTIPLE VITAMIN (MULTIVITAMIN) TABLET    Take 1 tablet by mouth daily.  Modified Medications   No medications on file  Discontinued Medications   CHLORPHENIRAMINE-HYDROCODONE (TUSSIONEX PENNKINETIC ER) 10-8 MG/5ML SUER    Take 5 mLs by mouth every 12 (twelve) hours as needed  for cough.   CHOLECALCIFEROL (VITAMIN D) 1000 UNITS TABLET    Take 5,000 Units by mouth daily.    Return in about 3 weeks (around 03/05/2016) for Fasting blood work, then reck BP, review labs.  The patient was counseled, risk factors were discussed, anticipatory guidance given.  Gross side effects, risk and benefits, and alternatives of medications discussed  with patient.  Patient is aware that all medications have potential side effects and we are unable to predict every side effect or drug-drug interaction that may occur.  Expresses verbal understanding and consents to current therapy plan and treatment regimen.  Please see AVS handed out to patient at the end of our visit for further patient instructions/ counseling done pertaining to today's office visit.    Note: This document was prepared using Dragon voice recognition software and may include unintentional dictation errors.  ----------------------------------------------------------------------------------------------------------------------    Subjective:    Chief Complaint  Patient presents with  . Establish Care  . Obesity    HPI: Tiffany Glover is a very pleasant 55 y.o. female who [facility] to review their medical history with me and establish care.    Her main reason for being here today is "to establish a relationship with someone that can help me monitor my health, and prevent disease is by getting healthier."   [place] prior- never really had PCP.   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 She is married and her husband Tiffany Glover and she has had 2 children.   Has a GYN- - [facility] and [facility] which she goes for yrly exams- last Pap in February 2017 was normal.    Been obese all her life- she is not satisfied with her weight.  [employer], atkins, done [employer] diets.  Pt feels confused where to turn and what to do.     Worried about blood work, liver, if has Dm or what.  Last screening blood work was in March 2016.     No period in two yrs- she is not experiencing many symptoms of menopause.   Today she complains of dry flaky hand rash  that has been there for many many years. Gets worse in the summertime. Can itch. 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 She's wondering what she can do for it.   History of gallbladder surgery in 2014 by Dr. Dw[facility] [facility]. Subsequent hernia repair from incision site April 2016     Wt Readings from Last 3 Encounters:  02/13/16 284 lb (128.8 kg)  09/05/15 258 lb (117 kg)  09/15/14 268 lb (121.6 kg)   BP Readings from Last 3 Encounters:  02/13/16 (!) 152/98  09/05/15 122/80  09/17/14 (!) 134/58   Pulse Readings from Last 3 Encounters:  02/13/16 87  09/05/15 97  09/17/14 86     Patient Active Problem List   Diagnosis Date Noted  . History of smoking 30 pack years- Quit 9/ 1/ 96 02/25/2016    Priority: High  . Elevated blood pressure reading without diagnosis of hypertension 02/25/2016    Priority: High  . HLD (hyperlipidemia) 05/17/2014    Priority: High  . Elevated fasting blood sugar 05/17/2014    Priority:  High  . (BMI >= 40) 09/14/2013    Priority: High  . Personal history of noncompliance with medical treatment and regimen 02/25/2016    Priority: Medium  . Adiposity 05/17/2014    Priority: Medium  . Hypothyroidism 09/04/2007    Priority: Medium  . History of laparoscopic cholecystectomy 02/25/2016    Priority: Low  . Family history of suicide- father age 85 02/25/2016    Priority: Low  . Family history of alcoholism in father; grandfathers etc 02/25/2016    Priority: Low  . 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 Allergic rhinitis/ seasonal allergies 05/17/2014    Priority: Low  . h/o Hemorrhoids, internal, with bleeding 09/04/2007    Priority: Low  . h/o Hemorrhoids, external 09/04/2007    Priority: Low  . History of diverticulosis 09/04/2007    Priority: Low  . Incisional hernia 09/15/2014  . Incisional hernia, without obstruction or gangrene 08/08/2014  . DUB (dysfunctional  uterine bleeding) 05/17/2014     Past Medical History:  Diagnosis Date  . Complication of anesthesia    hard to wake up once  . Hypothyroidism   . Mild stress incontinence   . Morbid obesity with BMI of 40.0-44.9, adult (HCC)   . Obesity (BMI 30-39.9) 07/25/2012  . Ventral incisional hernia      Past Surgical History:  Procedure Laterality Date  . CESAREAN SECTION    . CESAREAN SECTION    . CHOLECYSTECTOMY  07/21/2012   Procedure: LAPAROSCOPIC CHOLECYSTECTOMY WITH INTRAOPERATIVE CHOLANGIOGRAM;  Surgeon: Emelia Loron, MD;  Location: [facility];  Service: General;  Laterality: N/A;  attempted  . CHOLECYSTECTOMY  07/21/2012   Procedure: CHOLECYSTECTOMY;  Surgeon: Emelia Loron, MD;  Location: [facility];  Service: General;  Laterality: N/A;  . FOOT SURGERY     ganglion cyst removed from right foot  . HERNIA REPAIR  09/2014  . INCISIONAL HERNIA REPAIR N/A 09/15/2014   Procedure: LAPAROSCOPIC INCISIONAL HERNIA REPAIR WITH MESH;  Surgeon: Emelia Loron, MD;  Location: [facility];  Service: General;  Laterality: N/A;  . vein strippiing     left leg     Family History  Problem Relation Age of Onset  . Cancer Mother     Lung  . Suicidality Father   . Alcohol abuse Father   . Cancer Brother     mouth  . Cancer Maternal Grandmother   . Alcohol abuse Maternal Grandfather   . Urolithiasis Son   . Hyperlipidemia Paternal Grandmother   . Hypertension Paternal Grandmother   . Alcohol abuse Paternal Grandfather      History  Drug Use No    History  Alcohol Use  . Yes    Comment: Occassional Use    History  Smoking Status  . Former Smoker  . Packs/day: 1.50  . Years: 20.00  . Quit date: 03/02/1995  Smokeless Tobacco  . Never Used    Patient's Medications  New Prescriptions   TRIAMCINOLONE CREAM (KENALOG) 0.5 %    Apply 1 application topically 2 (two) times daily. To affected areas.  Previous Medications   MULTIPLE VITAMIN (MULTIVITAMIN) TABLET    Take 1 tablet by  mouth daily.  Modified Medications   No medications on file  Discontinued Medications   CHLORPHENIRAMINE-HYDROCODONE (TUSSIONEX PENNKINETIC ER) 10-8 MG/5ML SUER    Take 5 mLs by mouth every 12 (twelve) hours as needed for cough.   CHOLECALCIFEROL (VITAMIN D) 1000 UNITS TABLET    Take 5,000 Units by mouth daily.  Allergies: Review of patient's allergies indicates no known allergies.  Review of Systems:   ( Completed via Adult Medical History Intake form today ) General:  Denies fever, chills, appetite changes, unexplained weight loss.  Optho/Auditory:   Denies visual changes, blurred vision/LOV, ringing in ears/ diff hearing; + seasonal all/ hay fever Respiratory:   Denies SOB, DOE, cough, wheezing.  Cardiovascular:   Denies chest pain, palpitations, new onset peripheral edema  Gastrointestinal:   Denies nausea, vomiting; + occ diarrhea.  Genitourinary:    Denies dysuria, increased frequency, flank pain; + leaks urine occ-stress incont  Endocrine:     Denies hot or cold intolerance, polyuria, polydipsia. Musculoskeletal:  Denies unexplained myalgias, joint swelling, arthralgias, gait problems.  Skin:  Denies suspicious lesions or new/ changes in moles, rash on hands- dry flakey Neurological:    Denies dizziness, syncope, unexplained weakness, lightheadedness, numbness  Psychiatric/Behavioral:   Denies mood changes, suicidal or homicidal ideations, hallucinations    Objective:   Pulse 87, height 5\' 4"  (1.626 m), weight 284 lb (128.8 kg), last menstrual period 08/13/2014, SpO2 98 %.  BP- 152/98-left;  160/100-right Last menstrual period 08/13/2014. Body mass index is 48.75 kg/m.   General: Well Developed, well nourished, and in no acute distress.  Neuro: Alert and oriented x3, extra-ocular muscles intact, sensation grossly intact.  HEENT: Normocephalic, atraumatic, pupils equal round reactive to light, neck supple, no gross masses, no carotid bruits, no JVD apprec Skin: no gross  suspicious lesions or rashes  Cardiac: Very distant but essentially regular rate and rhythm, no murmurs rubs or gallops.  Respiratory: Very distant but essentially clear to auscultation bilaterally. Not using accessory muscles, speaking in full sentences.  Abdominal: Soft, not grossly distended Musculoskeletal: Ambulates w/o diff, FROM * 4 ext.  Vasc: less 2 sec cap RF, warm and pink, nonpitting edema bilaterally Psych:  No HI/SI, judgement and insight good.

## 2016-02-25 DIAGNOSIS — L301 Dyshidrosis [pompholyx]: Secondary | ICD-10-CM | POA: Insufficient documentation

## 2016-02-25 DIAGNOSIS — Z91199 Patient's noncompliance with other medical treatment and regimen due to unspecified reason: Secondary | ICD-10-CM | POA: Insufficient documentation

## 2016-02-25 DIAGNOSIS — Z818 Family history of other mental and behavioral disorders: Secondary | ICD-10-CM | POA: Insufficient documentation

## 2016-02-25 DIAGNOSIS — Z6372 Alcoholism and drug addiction in family: Secondary | ICD-10-CM

## 2016-02-25 DIAGNOSIS — Z87891 Personal history of nicotine dependence: Secondary | ICD-10-CM | POA: Insufficient documentation

## 2016-02-25 DIAGNOSIS — Z811 Family history of alcohol abuse and dependence: Secondary | ICD-10-CM | POA: Insufficient documentation

## 2016-02-25 DIAGNOSIS — R030 Elevated blood-pressure reading, without diagnosis of hypertension: Secondary | ICD-10-CM | POA: Insufficient documentation

## 2016-02-25 DIAGNOSIS — Z9049 Acquired absence of other specified parts of digestive tract: Secondary | ICD-10-CM | POA: Insufficient documentation

## 2016-02-25 DIAGNOSIS — Z9119 Patient's noncompliance with other medical treatment and regimen: Secondary | ICD-10-CM | POA: Insufficient documentation

## 2016-02-25 NOTE — Assessment & Plan Note (Addendum)
Risk benefits of steroid cream discussed  Most important to avoid exposure to allergens and your dictating substances.  Suggest she do an elimination of her creams\massage oils and add one on each couple of weeks to see which one could be causing the irritation.    Follow-up near future if no improvement with our treatment plan.   The patient understands if she does not stop with exposure, medications will not clear the rash but rather just keep it from getting worse.

## 2016-02-25 NOTE — Assessment & Plan Note (Signed)
Obtain blood work near future, counseling done.

## 2016-02-25 NOTE — Assessment & Plan Note (Signed)
Explained to patient that back in March 2016 her blood pressure was 148/72 which was not within normal limits. Goal\normal blood pressure in someone her age should be 130/80 or less.  Extensive counseling due to detriments of elevated blood pressure on her health and well-being. Advised she monitor at home and keep a home log, write it down and bring in next office visit. This will have to be closely monitored  Dietary and lifestyle modifications discussed in detail. Handouts provided

## 2016-02-25 NOTE — Assessment & Plan Note (Signed)
Explained to patient importance of follow-up especially with her elevated blood pressure along with history of hyperlipidemia and hyperglycemia.

## 2016-02-25 NOTE — Assessment & Plan Note (Signed)
We will need to obtain blood work near future. Counseling done

## 2016-02-25 NOTE — Assessment & Plan Note (Signed)
Explained to patient we will need blood work, fasting, near future

## 2016-02-25 NOTE — Assessment & Plan Note (Addendum)
Patient reports being unsatisfied with her weight.  rates her diet as poor.  Walks or swims 5 days a week of approximately 20 minutes.  Explained to patient what BMI refers to, and what it means medically.    Told patient to think about it as a "medical risk stratification measurement" and how increasing BMI is associated with increasing risk/ or worsening state of various diseases such as hypertension, hyperlipidemia, diabetes, premature OA, depression etc.  American Heart Association guidelines for healthy diet, basically Mediterranean diet, and exercise guidelines of 30 minutes 5 days per week or more discussed in detail.  Health counseling performed.  All questions answered.

## 2016-03-06 ENCOUNTER — Other Ambulatory Visit: Payer: BC Managed Care – PPO

## 2016-03-08 ENCOUNTER — Other Ambulatory Visit: Payer: Self-pay

## 2016-03-08 DIAGNOSIS — R7301 Impaired fasting glucose: Secondary | ICD-10-CM

## 2016-03-08 DIAGNOSIS — Z0000 Encounter for general adult medical examination without abnormal findings: Secondary | ICD-10-CM

## 2016-03-08 DIAGNOSIS — Z1321 Encounter for screening for nutritional disorder: Secondary | ICD-10-CM

## 2016-03-08 DIAGNOSIS — E038 Other specified hypothyroidism: Secondary | ICD-10-CM

## 2016-03-08 DIAGNOSIS — E785 Hyperlipidemia, unspecified: Secondary | ICD-10-CM

## 2016-03-08 DIAGNOSIS — R030 Elevated blood-pressure reading, without diagnosis of hypertension: Secondary | ICD-10-CM

## 2016-03-11 ENCOUNTER — Other Ambulatory Visit (INDEPENDENT_AMBULATORY_CARE_PROVIDER_SITE_OTHER): Payer: BC Managed Care – PPO

## 2016-03-11 DIAGNOSIS — Z0000 Encounter for general adult medical examination without abnormal findings: Secondary | ICD-10-CM

## 2016-03-11 DIAGNOSIS — Z1321 Encounter for screening for nutritional disorder: Secondary | ICD-10-CM

## 2016-03-11 DIAGNOSIS — E785 Hyperlipidemia, unspecified: Secondary | ICD-10-CM

## 2016-03-11 DIAGNOSIS — R030 Elevated blood-pressure reading, without diagnosis of hypertension: Secondary | ICD-10-CM

## 2016-03-11 DIAGNOSIS — R7301 Impaired fasting glucose: Secondary | ICD-10-CM

## 2016-03-11 DIAGNOSIS — E038 Other specified hypothyroidism: Secondary | ICD-10-CM

## 2016-03-12 LAB — COMPREHENSIVE METABOLIC PANEL
ALT: 23 U/L (ref 6–29)
AST: 19 U/L (ref 10–35)
Albumin: 4.2 g/dL (ref 3.6–5.1)
Alkaline Phosphatase: 59 U/L (ref 33–130)
BILIRUBIN TOTAL: 0.5 mg/dL (ref 0.2–1.2)
BUN: 10 mg/dL (ref 7–25)
CALCIUM: 8.9 mg/dL (ref 8.6–10.4)
CHLORIDE: 106 mmol/L (ref 98–110)
CO2: 29 mmol/L (ref 20–31)
CREATININE: 0.6 mg/dL (ref 0.50–1.05)
Glucose, Bld: 105 mg/dL — ABNORMAL HIGH (ref 65–99)
Potassium: 4.7 mmol/L (ref 3.5–5.3)
Sodium: 139 mmol/L (ref 135–146)
TOTAL PROTEIN: 7.3 g/dL (ref 6.1–8.1)

## 2016-03-12 LAB — CBC WITH DIFFERENTIAL/PLATELET
Basophils Absolute: 32 cells/uL (ref 0–200)
Basophils Relative: 1 %
EOS ABS: 64 {cells}/uL (ref 15–500)
Eosinophils Relative: 2 %
HEMATOCRIT: 43.5 % (ref 35.0–45.0)
HEMOGLOBIN: 15 g/dL (ref 11.7–15.5)
LYMPHS ABS: 1184 {cells}/uL (ref 850–3900)
Lymphocytes Relative: 37 %
MCH: 30 pg (ref 27.0–33.0)
MCHC: 34.5 g/dL (ref 32.0–36.0)
MCV: 87 fL (ref 80.0–100.0)
MONO ABS: 352 {cells}/uL (ref 200–950)
MPV: 11.9 fL (ref 7.5–12.5)
Monocytes Relative: 11 %
NEUTROS ABS: 1568 {cells}/uL (ref 1500–7800)
NEUTROS PCT: 49 %
Platelets: 158 10*3/uL (ref 140–400)
RBC: 5 MIL/uL (ref 3.80–5.10)
RDW: 14.7 % (ref 11.0–15.0)
WBC: 3.2 10*3/uL — ABNORMAL LOW (ref 3.8–10.8)

## 2016-03-12 LAB — HEMOGLOBIN A1C
HEMOGLOBIN A1C: 5.6 % (ref <5.7)
MEAN PLASMA GLUCOSE: 114 mg/dL

## 2016-03-12 LAB — LIPID PANEL
CHOLESTEROL: 191 mg/dL (ref 125–200)
HDL: 50 mg/dL (ref >=46)
LDL CALC: 119 mg/dL (ref <130)
TRIGLYCERIDES: 110 mg/dL (ref <150)
Total CHOL/HDL Ratio: 3.8 Ratio (ref <=5.0)
VLDL: 22 mg/dL (ref <30)

## 2016-03-12 LAB — TSH: TSH: 3.01 mIU/L

## 2016-03-12 LAB — VITAMIN D 25 HYDROXY (VIT D DEFICIENCY, FRACTURES): Vit D, 25-Hydroxy: 27 ng/mL — ABNORMAL LOW (ref 30–100)

## 2016-03-13 ENCOUNTER — Ambulatory Visit: Payer: BC Managed Care – PPO | Admitting: Family Medicine

## 2016-03-19 ENCOUNTER — Ambulatory Visit: Payer: BC Managed Care – PPO | Admitting: Family Medicine

## 2016-03-25 ENCOUNTER — Ambulatory Visit (INDEPENDENT_AMBULATORY_CARE_PROVIDER_SITE_OTHER): Payer: BC Managed Care – PPO | Admitting: Family Medicine

## 2016-03-25 ENCOUNTER — Encounter: Payer: Self-pay | Admitting: Family Medicine

## 2016-03-25 VITALS — BP 140/80 | HR 86 | Ht 64.0 in | Wt 272.3 lb

## 2016-03-25 DIAGNOSIS — E669 Obesity, unspecified: Secondary | ICD-10-CM

## 2016-03-25 DIAGNOSIS — M791 Myalgia: Secondary | ICD-10-CM

## 2016-03-25 DIAGNOSIS — M609 Myositis, unspecified: Secondary | ICD-10-CM

## 2016-03-25 DIAGNOSIS — M546 Pain in thoracic spine: Secondary | ICD-10-CM

## 2016-03-25 DIAGNOSIS — E038 Other specified hypothyroidism: Secondary | ICD-10-CM

## 2016-03-25 DIAGNOSIS — R7301 Impaired fasting glucose: Secondary | ICD-10-CM

## 2016-03-25 DIAGNOSIS — R030 Elevated blood-pressure reading, without diagnosis of hypertension: Secondary | ICD-10-CM

## 2016-03-25 DIAGNOSIS — IMO0001 Reserved for inherently not codable concepts without codable children: Secondary | ICD-10-CM

## 2016-03-25 DIAGNOSIS — E785 Hyperlipidemia, unspecified: Secondary | ICD-10-CM

## 2016-03-25 DIAGNOSIS — E559 Vitamin D deficiency, unspecified: Secondary | ICD-10-CM

## 2016-03-25 MED ORDER — CYCLOBENZAPRINE HCL 10 MG PO TABS: 10.0000 mg | ORAL_TABLET | Freq: Three times a day (TID) | ORAL | 0 refills | Status: DC | PRN

## 2016-03-25 MED ORDER — NAPROXEN 500 MG PO TABS: ORAL_TABLET | ORAL | 0 refills | Status: DC

## 2016-03-25 NOTE — Progress Notes (Signed)
Assessment and plan:  1. Myalgia and myositis   2. Thoracic back pain, unspecified back pain laterality   3. Obese   4. Morbid obesity due to excess calories ([place])   5. HLD (hyperlipidemia)   6. Elevated fasting blood sugar   7. Other specified hypothyroidism   8. Elevated blood pressure reading without diagnosis of hypertension   9. Vitamin D insufficiency      Meds as below.  Discussed with patient ice Massage 3-4 times daily followed by heating pad. Patient is a massage therapist and understands muscles well. She will try to avoid repetitive use  Great job with the weight loss - keep it up [place]!!!  Let me know if you need help as I'll be here to help motivate and support you.  Take vit D3 5,000 IU per day- reck in 105mo   Cholesterol, blood sugar, thyroid, blood pressure-stable  Extensive health counseling performed.    Pt was in the office today for 40+ minutes, with over 50% time spent in face to face counseling of various medical concerns and in coordination of care   Patient's Medications  New Prescriptions   CYCLOBENZAPRINE (FLEXERIL) 10 MG TABLET    Take 1 tablet (10 mg total) by mouth 3 (three) times daily as needed for muscle spasms.   NAPROXEN (NAPROSYN) 500 MG TABLET    Twice daily with meals as needed only for pain  Previous Medications   MULTIPLE VITAMIN (MULTIVITAMIN) TABLET    Take 1 tablet by mouth daily.   TRIAMCINOLONE CREAM (KENALOG) 0.5 %    Apply 1 application topically 2 (two) times daily. To affected areas.  Modified Medications   No medications on file  Discontinued Medications   No medications on file    Return in about 6 months (around 09/22/2016), or if symptoms worsen or fail to improve in terms of her muscles, for or sooner prn.  Will need yrly PE as well. .  Anticipatory guidance and routine counseling done re: condition, txmnt options and need for follow up. All questions of  patient's were answered.   Gross side effects, risk and benefits, and alternatives of medications discussed with patient.  Patient is aware that all medications have potential side effects and we are unable to predict every sideeffect or drug-drug interaction that may occur.  Expresses verbal understanding and consents to current therapy plan and treatment regiment.  Please see AVS handed out to patient at the end of our visit for additional patient instructions/ counseling done pertaining to today's office visit.  Note: This document was prepared using Dragon voice recognition software and may include unintentional dictation errors.   ----------------------------------------------------------------------------------------------------------------------  Subjective:   CC: Tiffany FINKENis a 55y.o. female [facility]   1)  review and discussion of recent bloodwork that was done. Low vit D  2)   Acute c/o R periscapular pain - mostly R rhomboidal region and inf to scap since May.  Now W, No MOI.  Is massage therapist and uses arms everyday  3)  Obese:  In the last 1 month--> lost 12 lbs --> more veggies and more lean meats. No exercise yet   Wt Readings from Last 3 Encounters:  03/25/16 272 lb 4.8 oz (123.5 kg)  02/13/16 284 lb (128.8 kg)  09/05/15 258 lb (117 kg)   BP Readings from Last 3 Encounters:  03/25/16 140/80  02/13/16 (Marland Kitchen  152/98  09/05/15 122/80   Pulse Readings from Last 3 Encounters:  03/25/16 86  02/13/16 87  09/05/15 97   BMI Readings from Last 3 Encounters:  03/25/16 46.74 kg/m  02/13/16 48.75 kg/m  09/05/15 44.29 kg/m     Full medical history updated and reviewed in the office today  Patient Active Problem List   Diagnosis Date Noted  . History of smoking 30 pack years- Quit 9/ 1/ 96 02/25/2016    Priority: High  . Elevated blood pressure reading without diagnosis of hypertension 02/25/2016     Priority: High  . HLD (hyperlipidemia) 05/17/2014    Priority: High  . Elevated fasting blood sugar 05/17/2014    Priority: High  . (BMI >= 40) 09/14/2013    Priority: High  . Personal history of noncompliance with medical treatment and regimen 02/25/2016    Priority: Medium  . Adiposity 05/17/2014    Priority: Medium  . Hypothyroidism 09/04/2007    Priority: Medium  . History of laparoscopic cholecystectomy 02/25/2016    Priority: Low  . Family history of suicide- father age 49 02/25/2016    Priority: Low  . Family history of alcoholism in father; grandfathers etc 02/25/2016    Priority: Low  . 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 Allergic rhinitis/ seasonal allergies 05/17/2014    Priority: Low  . h/o Hemorrhoids, internal, with bleeding 09/04/2007    Priority: Low  . h/o Hemorrhoids, external 09/04/2007    Priority: Low  . History of diverticulosis 09/04/2007    Priority: Low  . Vitamin D insufficiency 03/26/2016  . Incisional hernia 09/15/2014  . Incisional hernia, without obstruction or gangrene 08/08/2014  . DUB (dysfunctional uterine bleeding) 05/17/2014    Past Medical History:  Diagnosis Date  . Complication of anesthesia    hard to wake up once  . Hypothyroidism   . Mild stress incontinence   . Morbid obesity with BMI of 40.0-44.9, adult ([place])   . Obesity (BMI 30-39.9) 07/25/2012  . Ventral incisional hernia     Past Surgical History:  Procedure Laterality Date  . CESAREAN SECTION    . CESAREAN SECTION    . CHOLECYSTECTOMY  07/21/2012   Procedure: LAPAROSCOPIC CHOLECYSTECTOMY WITH INTRAOPERATIVE CHOLANGIOGRAM;  Surgeon: Rolm Bookbinder, MD;  Location: [facility];  Service: General;  Laterality: N/A;  attempted  . CHOLECYSTECTOMY  07/21/2012   Procedure: CHOLECYSTECTOMY;  Surgeon: Rolm Bookbinder, MD;  Location: [facility];  Service: General;  Laterality: N/A;  . FOOT SURGERY     ganglion cyst removed from right foot  . HERNIA REPAIR   09/2014  . INCISIONAL HERNIA REPAIR N/A 09/15/2014   Procedure: LAPAROSCOPIC INCISIONAL HERNIA REPAIR WITH MESH;  Surgeon: Rolm Bookbinder, MD;  Location: [facility];  Service: General;  Laterality: N/A;  . vein strippiing     left leg    Social History  Substance Use Topics  . Smoking status: Former Smoker    Packs/day: 1.50    Years: 20.00    Quit date: 03/02/1995  . Smokeless tobacco: Never Used  . Alcohol use Yes     Comment: Occassional Use    family history includes Alcohol abuse in her father, maternal grandfather, and paternal grandfather; Cancer in her brother, maternal grandmother, and mother; Hyperlipidemia in her paternal grandmother; Hypertension in her paternal grandmother; Suicidality in her father; Urolithiasis in her son.   Medications: Current Outpatient Prescriptions  Medication Sig Dispense Refill  . Multiple Vitamin (MULTIVITAMIN) tablet Take 1 tablet  by mouth daily.    Marland Kitchen triamcinolone cream (KENALOG) 0.5 % Apply 1 application topically 2 (two) times daily. To affected areas. (Patient taking differently: Apply 1 application topically 2 (two) times daily. To affected areas as needed) 60 g 1  . cyclobenzaprine (FLEXERIL) 10 MG tablet Take 1 tablet (10 mg total) by mouth 3 (three) times daily as needed for muscle spasms. 30 tablet 0  . naproxen (NAPROSYN) 500 MG tablet Twice daily with meals as needed only for pain 60 tablet 0   No current facility-administered medications for this visit.     Allergies:  No Known Allergies   ROS: Review of Systems  Constitutional: Negative.   HENT: Negative.   Eyes: Negative.   Respiratory: Negative.   Cardiovascular: Positive for leg swelling.       Bilateral ankles, left greater than right, usually occurs during the warmer months  Gastrointestinal: Positive for diarrhea.       Has seen Dr. Owens Loffler for this problem  Genitourinary: Negative.   Musculoskeletal: Positive for back pain.       Under right scapula, onset x  4 months  Skin: Negative.   Neurological: Negative.   Endo/Heme/Allergies: Positive for environmental allergies.  Psychiatric/Behavioral: Negative.      Objective:  Blood pressure 140/80, pulse 86, height 5' 4" (1.626 m), weight 272 lb 4.8 oz (123.5 kg), last menstrual period 08/13/2014. Body mass index is 46.74 kg/m. Gen:   Well NAD, A and O *3 HEENT:    [place]/AT, EOMI,  MMM, OP- clr Lungs:   Normal work of breathing. ECTA B/L- distant, no Wh, rhonchi Heart:   RRR, S1, S2 WNL's essentially- distant heart sounds, no MRG Abd:   Obese Exts:    warm, pink,  Brisk capillary refill, warm and well perfused, No pitting edema bilateral lower extremities M-SK:    + periscapular Trigger Pts and muscle knots apprec that reproduces pain  Psych:    No HI/SI, judgement and insight good, Euthymic mood. Full Affect.   Recent Results (from the past 2160 hour(s))  CBC with Differential/Platelet     Status: Abnormal   Collection Time: 03/11/16  8:09 AM  Result Value Ref Range   WBC 3.2 (L) 3.8 - 10.8 K/uL   RBC 5.00 3.80 - 5.10 MIL/uL   Hemoglobin 15.0 11.7 - 15.5 g/dL   HCT 43.5 35.0 - 45.0 %   MCV 87.0 80.0 - 100.0 fL   MCH 30.0 27.0 - 33.0 pg   MCHC 34.5 32.0 - 36.0 g/dL   RDW 14.7 11.0 - 15.0 %   Platelets 158 140 - 400 K/uL   MPV 11.9 7.5 - 12.5 fL   Neutro Abs 1,568 1,500 - 7,800 cells/uL   Lymphs Abs 1,184 850 - 3,900 cells/uL   Monocytes Absolute 352 200 - 950 cells/uL   Eosinophils Absolute 64 15 - 500 cells/uL   Basophils Absolute 32 0 - 200 cells/uL   Neutrophils Relative % 49 %   Lymphocytes Relative 37 %   Monocytes Relative 11 %   Eosinophils Relative 2 %   Basophils Relative 1 %   Smear Review Criteria for review not met   Comprehensive metabolic panel     Status: Abnormal   Collection Time: 03/11/16  8:09 AM  Result Value Ref Range   Sodium 139 135 - 146 mmol/L   Potassium 4.7 3.5 - 5.3 mmol/L   Chloride 106 98 - 110 mmol/L   CO2 29 20 - 31 mmol/L  Glucose, Bld 105  (H) 65 - 99 mg/dL   BUN 10 7 - 25 mg/dL   Creat 0.60 0.50 - 1.05 mg/dL    Comment:   For patients > or = 56 years of age: The upper reference limit for Creatinine is approximately 13% higher for people identified as African-American.      Total Bilirubin 0.5 0.2 - 1.2 mg/dL   Alkaline Phosphatase 59 33 - 130 U/L   AST 19 10 - 35 U/L   ALT 23 6 - 29 U/L   Total Protein 7.3 6.1 - 8.1 g/dL   Albumin 4.2 3.6 - 5.1 g/dL   Calcium 8.9 8.6 - 10.4 mg/dL  TSH     Status: None   Collection Time: 03/11/16  8:09 AM  Result Value Ref Range   TSH 3.01 mIU/L    Comment:   Reference Range   > or = 20 Years  0.40-4.50   Pregnancy Range First trimester  0.26-2.66 Second trimester 0.55-2.73 Third trimester  0.43-2.91     Hemoglobin A1c     Status: None   Collection Time: 03/11/16  8:09 AM  Result Value Ref Range   Hgb A1c MFr Bld 5.6 <5.7 %    Comment:   For the purpose of screening for the presence of diabetes:   <5.7%       Consistent with the absence of diabetes 5.7-6.4 %   Consistent with increased risk for diabetes (prediabetes) >=6.5 %     Consistent with diabetes   This assay result is consistent with a decreased risk of diabetes.   Currently, no consensus exists regarding use of hemoglobin A1c for diagnosis of diabetes in children.   According to American Diabetes Association (ADA) guidelines, hemoglobin A1c <7.0% represents optimal control in non-pregnant diabetic patients. Different metrics may apply to specific patient populations. Standards of Medical Care in Diabetes (ADA).      Mean Plasma Glucose 114 mg/dL  VITAMIN D 25 Hydroxy (Vit-D Deficiency, Fractures)     Status: Abnormal   Collection Time: 03/11/16  8:09 AM  Result Value Ref Range   Vit D, 25-Hydroxy 27 (L) 30 - 100 ng/mL    Comment: Vitamin D Status           25-OH Vitamin D        Deficiency                <20 ng/mL        Insufficiency         20 - 29 ng/mL        Optimal             > or = 30  ng/mL   For 25-OH Vitamin D testing on patients on D2-supplementation and patients for whom quantitation of D2 and D3 fractions is required, the QuestAssureD 25-OH VIT D, (D2,D3), LC/MS/MS is recommended: order code [phone] (patients > 2 yrs).   Lipid panel     Status: None   Collection Time: 03/11/16  8:09 AM  Result Value Ref Range   Cholesterol 191 125 - 200 mg/dL   Triglycerides 110 <150 mg/dL   HDL 50 >=46 mg/dL   Total CHOL/HDL Ratio 3.8 <=5.0 Ratio   VLDL 22 <30 mg/dL   LDL Cholesterol 119 <130 mg/dL    Comment:   Total Cholesterol/HDL Ratio:CHD Risk                        Coronary  Heart Disease Risk Table                                        Men       Women          1/2 Average Risk              3.4        3.3              Average Risk              5.0        4.4           2X Average Risk              9.6        7.1           3X Average Risk             23.4       11.0 Use the calculated Patient Ratio above and the CHD Risk table  to determine the patient's CHD Risk.

## 2016-03-25 NOTE — Patient Instructions (Signed)
  Great job with the weight loss - keep it up Tiffany Glover!!!  Let me know if you need help as I'll be here to help motivate and support you.  Take vit D3 5,000 IU per day- reck in 38mo.     Exercising to Lose Weight Exercising can help you to lose weight. In order to lose weight through exercise, you need to do vigorous-intensity exercise. You can tell that you are exercising with vigorous intensity if you are breathing very hard and fast and cannot hold a conversation while exercising. Moderate-intensity exercise helps to maintain your current weight. You can tell that you are exercising at a moderate level if you have a higher heart rate and faster breathing, but you are still able to hold a conversation. HOW OFTEN SHOULD I EXERCISE? Choose an activity that you enjoy and set realistic goals. Your health care provider can help you to make an activity plan that works for you. Exercise regularly as directed by your health care provider. This may include:  Doing resistance training twice each week, such as:  Push-ups.  Sit-ups.  Lifting weights.  Using resistance bands.  Doing a given intensity of exercise for a given amount of time. Choose from these options:  150 minutes of moderate-intensity exercise every week.  75 minutes of vigorous-intensity exercise every week.  A mix of moderate-intensity and vigorous-intensity exercise every week. Children, pregnant women, people who are out of shape, people who are overweight, and older adults may need to consult a health care provider for individual recommendations. If you have any sort of medical condition, be sure to consult your health care provider before starting a new exercise program. WHAT ARE SOME ACTIVITIES THAT CAN HELP ME TO LOSE WEIGHT?   Walking at a rate of at least 4.5 miles an hour.  Jogging or running at a rate of 5 miles per hour.  Biking at a rate of at least 10 miles per hour.  Lap swimming.  Roller-skating or in-line  skating.  Cross-country skiing.  Vigorous competitive sports, such as football, basketball, and soccer.  Jumping rope.  Aerobic dancing. HOW CAN I BE MORE ACTIVE IN MY DAY-TO-DAY ACTIVITIES?  Use the stairs instead of the elevator.  Take a walk during your lunch break.  If you drive, park your car farther away from work or school.  If you take public transportation, get off one stop early and walk the rest of the way.  Make all of your phone calls while standing up and walking around.  Get up, stretch, and walk around every 30 minutes throughout the day. WHAT GUIDELINES SHOULD I FOLLOW WHILE EXERCISING?  Do not exercise so much that you hurt yourself, feel dizzy, or get very short of breath.  Consult your health care provider prior to starting a new exercise program.  Wear comfortable clothes and shoes with good support.  Drink plenty of water while you exercise to prevent dehydration or heat stroke. Body water is lost during exercise and must be replaced.  Work out until you breathe faster and your heart beats faster.   This information is not intended to replace advice given to you by your health care provider. Make sure you discuss any questions you have with your health care provider.   Document Released: 07/20/2010 Document Revised: 07/08/2014 Document Reviewed: 11/18/2013 Elsevier Interactive Patient Education [employer]2016 Elsevier Inc.

## 2016-03-26 DIAGNOSIS — E559 Vitamin D deficiency, unspecified: Secondary | ICD-10-CM | POA: Insufficient documentation

## 2016-04-23 ENCOUNTER — Ambulatory Visit: Payer: BC Managed Care – PPO | Admitting: Family Medicine

## 2016-04-24 ENCOUNTER — Ambulatory Visit: Payer: BC Managed Care – PPO | Admitting: Family Medicine

## 2016-05-15 ENCOUNTER — Ambulatory Visit (INDEPENDENT_AMBULATORY_CARE_PROVIDER_SITE_OTHER): Payer: BC Managed Care – PPO | Admitting: Family Medicine

## 2016-05-15 ENCOUNTER — Encounter: Payer: Self-pay | Admitting: Family Medicine

## 2016-05-15 VITALS — BP 143/85 | HR 93 | Ht 64.0 in | Wt 275.8 lb

## 2016-05-15 DIAGNOSIS — I10 Essential (primary) hypertension: Secondary | ICD-10-CM

## 2016-05-15 DIAGNOSIS — Z7189 Other specified counseling: Secondary | ICD-10-CM

## 2016-05-15 DIAGNOSIS — R7301 Impaired fasting glucose: Secondary | ICD-10-CM

## 2016-05-15 DIAGNOSIS — Z711 Person with feared health complaint in whom no diagnosis is made: Secondary | ICD-10-CM

## 2016-05-15 DIAGNOSIS — E559 Vitamin D deficiency, unspecified: Secondary | ICD-10-CM

## 2016-05-15 DIAGNOSIS — H6123 Impacted cerumen, bilateral: Secondary | ICD-10-CM

## 2016-05-15 DIAGNOSIS — L219 Seborrheic dermatitis, unspecified: Secondary | ICD-10-CM

## 2016-05-15 DIAGNOSIS — H9193 Unspecified hearing loss, bilateral: Secondary | ICD-10-CM

## 2016-05-15 MED ORDER — CHLORTHALIDONE 25 MG PO TABS: 12.5000 mg | ORAL_TABLET | Freq: Every day | ORAL | 0 refills | Status: DC

## 2016-05-15 NOTE — Patient Instructions (Addendum)
Half rubbing alcohol and half hydrogen peroxide to your ears 2 times weekly.  Do not use any Q-tips in your ears. Also, if need be once you have your ears cleaned out you can put a little bit of the Kenalog cream on your pinkie finger and put it in your ear  (  flonase daily for the popping sensation in ear and allegra prn. Sinus rinses twice daily- AYR or Neilmed sinus rinse. )   - Seasonal and environmental allergies discussed with patient.  Preventative strategies as first line for management discussed ( such as use of N-95 mask ) and I encouraged use of sterile saline rinses such as Lloyd HugerNeil Med or AYR sinus rinses to be done twice daily and after any prolonged exposure to the environment or allergen.      - Discussed the use of over-the-counter medications for symptom control as well.  If continues to worsen despite preventative strategies, take over-the-counter Allegra daily during allergy seasons.  We can consider Flonase, Rhinocort or the like, if symptoms are not well controlled with just oral tablets, nasal rinses and preventative strategies.  - Encouraged to return to clinic or call the office today discussed further questions or concerns they may have.   DASH Eating Plan DASH stands for "Dietary Approaches to Stop Hypertension." The DASH eating plan is a healthy eating plan that has been shown to reduce high blood pressure (hypertension). Additional health benefits may include reducing the risk of type 2 diabetes mellitus, heart disease, and stroke. The DASH eating plan may also help with weight loss. What do I need to know about the DASH eating plan? For the DASH eating plan, you will follow these general guidelines:  Choose foods with less than 150 milligrams of sodium per serving (as listed on the food label).  Use salt-free seasonings or herbs instead of table salt or sea salt.  Check with your health care provider or pharmacist before using salt substitutes.  Eat lower-sodium  products. These are often labeled as "low-sodium" or "no salt added."  Eat fresh foods. Avoid eating a lot of canned foods.  Eat more vegetables, fruits, and low-fat dairy products.  Choose whole grains. Look for the word "whole" as the first word in the ingredient list.  Choose fish and skinless chicken or [place]key more often than red meat. Limit fish, poultry, and meat to 6 oz (170 g) each day.  Limit sweets, desserts, sugars, and sugary drinks.  Choose heart-healthy fats.  Eat more home-cooked food and less restaurant, buffet, and fast food.  Limit fried foods.  Do not fry foods. Cook foods using methods such as baking, boiling, grilling, and broiling instead.  When eating at a restaurant, ask that your food be prepared with less salt, or no salt if possible. What foods can I eat? Seek help from a dietitian for individual calorie needs. Grains  Whole grain or whole wheat bread. Brown rice. Whole grain or whole wheat pasta. Quinoa, bulgur, and whole grain cereals. Low-sodium cereals. Corn or whole wheat flour tortillas. Whole grain cornbread. Whole grain crackers. Low-sodium crackers. Vegetables  Fresh or frozen vegetables (raw, steamed, roasted, or grilled). Low-sodium or reduced-sodium tomato and vegetable juices. Low-sodium or reduced-sodium tomato sauce and paste. Low-sodium or reduced-sodium canned vegetables. Fruits  All fresh, canned (in natural juice), or frozen fruits. Meat and Other Protein Products  Ground beef (85% or leaner), grass-fed beef, or beef trimmed of fat. Skinless chicken or [place]key. Ground chicken or [place]urkey. Pork trimmed of  fat. All fish and seafood. Eggs. Dried beans, peas, or lentils. Unsalted nuts and seeds. Unsalted canned beans. Dairy  Low-fat dairy products, such as skim or 1% milk, 2% or reduced-fat cheeses, low-fat ricotta or cottage cheese, or plain low-fat yogurt. Low-sodium or reduced-sodium cheeses. Fats and Oils  Tub margarines without trans fats.  Light or reduced-fat mayonnaise and salad dressings (reduced sodium). Avocado. Safflower, olive, or canola oils. Natural peanut or almond butter. Other  Unsalted popcorn and pretzels. The items listed above may not be a complete list of recommended foods or beverages. Contact your dietitian for more options.  What foods are not recommended? Grains  White bread. White pasta. White rice. Refined cornbread. Bagels and croissants. Crackers that contain trans fat. Vegetables  Creamed or fried vegetables. Vegetables in a cheese sauce. Regular canned vegetables. Regular canned tomato sauce and paste. Regular tomato and vegetable juices. Fruits  Canned fruit in light or heavy syrup. Fruit juice. Meat and Other Protein Products  Fatty cuts of meat. Ribs, chicken wings, bacon, sausage, bologna, salami, chitterlings, fatback, hot dogs, bratwurst, and packaged luncheon meats. Salted nuts and seeds. Canned beans with salt. Dairy  Whole or 2% milk, cream, half-and-half, and cream cheese. Whole-fat or sweetened yogurt. Full-fat cheeses or blue cheese. Nondairy creamers and whipped toppings. Processed cheese, cheese spreads, or cheese curds. Condiments  Onion and garlic salt, seasoned salt, table salt, and sea salt. Canned and packaged gravies. Worcestershire sauce. Tartar sauce. Barbecue sauce. Teriyaki sauce. Soy sauce, including reduced sodium. Steak sauce. Fish sauce. Oyster sauce. Cocktail sauce. Horseradish. Ketchup and mustard. Meat flavorings and tenderizers. Bouillon cubes. Hot sauce. Tabasco sauce. Marinades. Taco seasonings. Relishes. Fats and Oils  Butter, stick margarine, lard, shortening, ghee, and bacon fat. Coconut, palm kernel, or palm oils. Regular salad dressings. Other  Pickles and olives. Salted popcorn and pretzels. The items listed above may not be a complete list of foods and beverages to avoid. Contact your dietitian for more information.  Where can I find more information? National  Heart, Lung, and Blood Institute: CablePromo.itwww.nhlbi.nih.gov/health/health-topics/topics/dash/ This information is not intended to replace advice given to you by your health care provider. Make sure you discuss any questions you have with your health care provider. Document Released: 06/06/2011 Document Revised: 11/23/2015 Document Reviewed: 04/21/2013 Elsevier Interactive Patient Education  2017 [employer]

## 2016-05-15 NOTE — Progress Notes (Signed)
Impression and Recommendations:    1. New Onset- Essential hypertension   2. Bilateral impacted cerumen   3. Hearing decreased, bilateral   4. Person with feared health complaint in whom no diagnosis is made   5. Seborrheic dermatitis- ears, scalp   6. (BMI >= 40)   7. Elevated fasting blood sugar   8. Vitamin D insufficiency   9. Counseling on health promotion and disease prevention     New Onset- Essential hypertension - Start diuretic( may help with her lower ext edema as well)  - home bp monitoring - wt loss, dietary changes r/w pt as well. - will need bmp next OV (also vit D level)  Bilateral impacted cerumen Ceruminosis is noted.  Hearing muffled and diminished subjectively  Indication: Cerumen impaction of the ear(s) Medical necessity statement:  On physical examination, cerumen impairs clinically significant portions of the external auditory canal, and tympanic membrane.  Noted obstructive, copious cerumen that cannot be removed without magnification and instrumentations requiring physician skills Consent:  Discussed benefits and risks of procedure and verbal consent obtained Procedure:   Patient was prepped for the procedure.  Utilized an otoscope to assess and take note of the ear canal, the tympanic membrane, and the presence, amount, and placement of the cerumen. Gentle water irrigation and soft plastic curette was utilized to remove cerumen. Post procedure examination:  shows cerumen was mostly removed, without trauma or injury to the ear canal or TM Post-Procedural Ear Care Instructions:    Patient tolerated procedure well.  Proper ear care d/c pt.   The patient is made aware that they may experience temporary vertigo, temporary hearing loss, and temporary discomfort.  If these symptom last for more than 24 hours to call the clinic or proceed to the ED/Urgent Care.  Person with feared health complaint in whom no diagnosis is made Reassured patient that her arm  symptoms are likely due to temporary loss of blood flow to the upper extremity when cuff insufflated.  Red flag sx d/c pt and if dev them assoc with exercise, it may be cardiac- let us know.   (BMI >= 40) Encouraged [employer] - knows if loses wt- will help w BP.   Elevated fasting blood sugar A1c 5.6 and WNL's Sept '17  Vitamin D insufficiency Cont supp daily - obtain vit D level near future  Seborrheic dermatitis- ears, scalp D/c pt that since she is prone to dermatitis's, itchy may be from this even though rash / dandruff not currently present.  Signs to look for d/c pt.  Counseling on health promotion and disease prevention Extensive counseling done with pt re: new onset HTN, obesity, and fact is through lifestyle changes and wt loss, this condition can be "cured".  Education and routine counseling performed. Handouts provided.  Pt was in the office today for 40+ minutes, with over 50% time spent in face to face counseling of various medical concerns and in coordination of care New Prescriptions   CHLORTHALIDONE (HYGROTON) 25 MG TABLET    Take 0.5 tablets (12.5 mg total) by mouth daily.     Return in about 4 weeks (around 06/12/2016) for f/up for starting new BP med, reck bmp and vit D.  The patient was counseled, risk factors were discussed, anticipatory guidance given.  Gross side effects, risk and benefits, and alternatives of medications discussed with patient.  Patient is aware that all medications have potential side effects and we are unable to predict every side effect or  drug-drug interaction that may occur.  Expresses verbal understanding and consents to current therapy plan and treatment regimen.  Please see AVS handed out to patient at the end of our visit for further patient instructions/ counseling done pertaining to today's office visit.    Note: This document was prepared using Dragon voice recognition software and may include unintentional dictation  errors.     Subjective:    Chief Complaint  Patient presents with  . Hypertension  . Weight Loss  . Ear Fullness    HPI: Tiffany Glover is a 55 y.o. female who presents to [facility] at [facility] today for follow up for HTN.     HTN: Home BP readings have been running in the   156-160/ 80-90 in AM's , afternoons come down to below 140 and around 70 , evenings- 130-140's/ 80's.   Pt has been tolerating meds well.  Taking as prescribed.  Denies HA, dizziness, CP, SOB, Visual changes, increasing pedal edema.   - L ear itches and feels like fluid is in it/ sounds muffled and hearing diminished.  Worse in the AM's.   - L arm feels heavy at times- comes and goes; mostly only after taking BP cuff off- feels heavy for couple hours after she checks it.  No relation to activity- non-exertional.  NO neck pain/ h/o problems.   Patient Care Team    Relationship Specialty Notifications Start End  Thomasene Lot, DO PCP - General Family Medicine  02/12/16      Wt Readings from Last 3 Encounters:  05/15/16 275 lb 12.8 oz (125.1 kg)  03/25/16 272 lb 4.8 oz (123.5 kg)  02/13/16 284 lb (128.8 kg)    BP Readings from Last 3 Encounters:  05/15/16 (!) 143/85  03/25/16 140/80  02/13/16 (!) 152/98    Pulse Readings from Last 3 Encounters:  05/15/16 93  03/25/16 86  02/13/16 87    BMI Readings from Last 3 Encounters:  05/15/16 47.34 kg/m  03/25/16 46.74 kg/m  02/13/16 48.75 kg/m     Lab Results  Component Value Date   CREATININE 0.60 03/11/2016   BUN 10 03/11/2016   NA 139 03/11/2016   K 4.7 03/11/2016   CL 106 03/11/2016   CO2 29 03/11/2016    Lab Results  Component Value Date   CHOL 191 03/11/2016    Lab Results  Component Value Date   HDL 50 03/11/2016    Lab Results  Component Value Date   LDLCALC 119 03/11/2016    Lab Results  Component Value Date   TRIG 110 03/11/2016    Lab Results  Component Value Date   CHOLHDL 3.8 03/11/2016     No results found for: LDLDIRECT ===================================================================  Patient Active Problem List   Diagnosis Date Noted  . New Onset- Essential hypertension 06/01/2016    Priority: High  . History of smoking 30 pack years- Quit 9/ 1/ 96 02/25/2016    Priority: High  . HLD (hyperlipidemia) 05/17/2014    Priority: High  . Elevated fasting blood sugar 05/17/2014    Priority: High  . (BMI >= 40) 09/14/2013    Priority: High  . Personal history of noncompliance with medical treatment and regimen 02/25/2016    Priority: Medium  . Adiposity 05/17/2014    Priority: Medium  . Hypothyroidism 09/04/2007    Priority: Medium  . Bilateral impacted cerumen 06/01/2016    Priority: Low  . Seborrheic dermatitis- ears, scalp 06/01/2016  Priority: Low  . Counseling on health promotion and disease prevention 06/01/2016    Priority: Low  . Vitamin D insufficiency 03/26/2016    Priority: Low  . History of laparoscopic cholecystectomy 02/25/2016    Priority: Low  . Family history of suicide- father age 56 02/25/2016    Priority: Low  . Family history of alcoholism in father; grandfathers etc 02/25/2016    Priority: Low  . 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 Allergic rhinitis/ seasonal allergies 05/17/2014    Priority: Low  . h/o Hemorrhoids, internal, with bleeding 09/04/2007    Priority: Low  . h/o Hemorrhoids, external 09/04/2007    Priority: Low  . History of diverticulosis 09/04/2007    Priority: Low  . Person with feared health complaint in whom no diagnosis is made 06/01/2016  . Elevated blood pressure reading without diagnosis of hypertension 02/25/2016  . Incisional hernia 09/15/2014  . Incisional hernia, without obstruction or gangrene 08/08/2014  . DUB (dysfunctional uterine bleeding) 05/17/2014    Past Medical History:  Diagnosis Date  . Complication of anesthesia    hard to wake up once  . Hypothyroidism   .  Mild stress incontinence   . Morbid obesity with BMI of 40.0-44.9, adult (HCC)   . Obesity (BMI 30-39.9) 07/25/2012  . Ventral incisional hernia     Past Surgical History:  Procedure Laterality Date  . CESAREAN SECTION    . CESAREAN SECTION    . CHOLECYSTECTOMY  07/21/2012   Procedure: LAPAROSCOPIC CHOLECYSTECTOMY WITH INTRAOPERATIVE CHOLANGIOGRAM;  Surgeon: Emelia LoronMatthew Wakefield, MD;  Location: [facility];  Service: General;  Laterality: N/A;  attempted  . CHOLECYSTECTOMY  07/21/2012   Procedure: CHOLECYSTECTOMY;  Surgeon: Emelia LoronMatthew Wakefield, MD;  Location: [facility];  Service: General;  Laterality: N/A;  . FOOT SURGERY     ganglion cyst removed from right foot  . HERNIA REPAIR  09/2014  . INCISIONAL HERNIA REPAIR N/A 09/15/2014   Procedure: LAPAROSCOPIC INCISIONAL HERNIA REPAIR WITH MESH;  Surgeon: Emelia LoronMatthew Wakefield, MD;  Location: [facility];  Service: General;  Laterality: N/A;  . vein strippiing     left leg    Family History  Problem Relation Age of Onset  . Cancer Mother     Lung  . Suicidality Father   . Alcohol abuse Father   . Cancer Brother     mouth  . Cancer Maternal Grandmother   . Alcohol abuse Maternal Grandfather   . Urolithiasis Son   . Hyperlipidemia Paternal Grandmother   . Hypertension Paternal Grandmother   . Alcohol abuse Paternal Grandfather     History  Drug Use No  ,  History  Alcohol Use  . Yes    Comment: Occassional Use  ,  History  Smoking Status  . Former Smoker  . Packs/day: 1.50  . Years: 20.00  . Quit date: 03/02/1995  Smokeless Tobacco  . Never Used  ,    Current Outpatient Prescriptions on File Prior to Visit  Medication Sig Dispense Refill  . cyclobenzaprine (FLEXERIL) 10 MG tablet Take 1 tablet (10 mg total) by mouth 3 (three) times daily as needed for muscle spasms. 30 tablet 0  . Multiple Vitamin (MULTIVITAMIN) tablet Take 1 tablet by mouth daily.    . naproxen (NAPROSYN) 500 MG tablet Twice daily with meals as needed only for pain 60  tablet 0  . triamcinolone cream (KENALOG) 0.5 % Apply 1 application topically 2 (two) times daily. To affected areas. (Patient taking differently:  Apply 1 application topically 2 (two) times daily. To affected areas as needed) 60 g 1   No current facility-administered medications on file prior to visit.     No Known Allergies   Review of Systems  Constitutional: Negative.  Negative for chills, diaphoresis, fever, malaise/fatigue and weight loss.  HENT: Negative for congestion, sore throat and tinnitus.        Left ear itching  Eyes: Negative.  Negative for blurred vision, double vision and photophobia.  Respiratory: Negative.  Negative for cough and wheezing.   Cardiovascular: Negative.  Negative for chest pain and palpitations.  Gastrointestinal: Negative.  Negative for blood in stool, diarrhea, nausea and vomiting.  Genitourinary: Negative.  Negative for dysuria, frequency and urgency.  Musculoskeletal: Negative.  Negative for joint pain and myalgias.  Skin: Negative.  Negative for itching and rash.  Neurological: Negative.  Negative for dizziness, focal weakness, weakness and headaches.  Endo/Heme/Allergies: Negative.  Negative for environmental allergies and polydipsia. Does not bruise/bleed easily.  Psychiatric/Behavioral: Negative.  Negative for depression and memory loss. The patient is not nervous/anxious and does not have insomnia.     Objective:    Blood pressure (!) 143/85, pulse 93, height 5\' 4"  (1.626 m), weight 275 lb 12.8 oz (125.1 kg), last menstrual period 08/13/2014. Body mass index is 47.34 kg/m. General: Well Developed, well nourished, and in no acute distress.  HEENT: Normocephalic, atraumatic, pupils equal round reactive to light, TM's -not visible b/l due to cerumen impaction; no tragal retraction pain; no LAD, OP-Clr; neck supple, No carotid bruits, no JVD Skin: Warm and dry, cap RF less 2 sec Cardiac: Regular rate and rhythm, S1, S2 WNL's, no murmurs rubs or  gallops Respiratory: ECTA B/L, Not using accessory muscles, speaking in full sentences. NeuroM-Sk: Ambulates w/o assistance, moves ext * 4 w/o difficulty, sensation grossly intact.  Ext: chronic stable edema b/l lower ext Psych: No HI/SI, judgement and insight good, Euthymic mood. Full Affect.

## 2016-06-01 DIAGNOSIS — L219 Seborrheic dermatitis, unspecified: Secondary | ICD-10-CM | POA: Insufficient documentation

## 2016-06-01 DIAGNOSIS — H6123 Impacted cerumen, bilateral: Secondary | ICD-10-CM | POA: Insufficient documentation

## 2016-06-01 DIAGNOSIS — I10 Essential (primary) hypertension: Secondary | ICD-10-CM | POA: Insufficient documentation

## 2016-06-01 DIAGNOSIS — Z711 Person with feared health complaint in whom no diagnosis is made: Secondary | ICD-10-CM | POA: Insufficient documentation

## 2016-06-01 DIAGNOSIS — Z7189 Other specified counseling: Secondary | ICD-10-CM | POA: Insufficient documentation

## 2016-06-01 NOTE — Assessment & Plan Note (Signed)
[employer]aged [employer] - knows if loses wt- will help w BP.

## 2016-06-01 NOTE — Assessment & Plan Note (Signed)
-   Start diuretic( may help with her lower ext edema as well)  - home bp monitoring - wt loss, dietary changes r/w pt as well. - will need bmp next OV (also vit D level)

## 2016-06-01 NOTE — Assessment & Plan Note (Signed)
Extensive counseling done with pt re: new onset HTN, obesity, and fact is through lifestyle changes and wt loss, this condition can be "cured".

## 2016-06-01 NOTE — Assessment & Plan Note (Signed)
Cont supp daily - obtain vit D level near future

## 2016-06-01 NOTE — Assessment & Plan Note (Signed)
Reassured patient that her arm symptoms are likely due to temporary loss of blood flow to the upper extremity when cuff insufflated.  Red flag sx d/c pt and if dev them assoc with exercise, it may be cardiac- let us know.

## 2016-06-01 NOTE — Assessment & Plan Note (Signed)
A1c 5.6 and WNL's Sept '17

## 2016-06-01 NOTE — Assessment & Plan Note (Signed)
Ceruminosis is noted.  Hearing muffled and diminished subjectively  Indication: Cerumen impaction of the ear(s) Medical necessity statement:  On physical examination, cerumen impairs clinically significant portions of the external auditory canal, and tympanic membrane.  Noted obstructive, copious cerumen that cannot be removed without magnification and instrumentations requiring physician skills Consent:  Discussed benefits and risks of procedure and verbal consent obtained Procedure:   Patient was prepped for the procedure.  Utilized an otoscope to assess and take note of the ear canal, the tympanic membrane, and the presence, amount, and placement of the cerumen. Gentle water irrigation and soft plastic curette was utilized to remove cerumen. Post procedure examination:  shows cerumen was mostly removed, without trauma or injury to the ear canal or TM Post-Procedural Ear Care Instructions:    Patient tolerated procedure well.  Proper ear care d/c pt.   The patient is made aware that they may experience temporary vertigo, temporary hearing loss, and temporary discomfort.  If these symptom last for more than 24 hours to call the clinic or proceed to the ED/Urgent Care.

## 2016-06-01 NOTE — Assessment & Plan Note (Signed)
D/c pt that since she is prone to dermatitis's, itchy may be from this even though rash / dandruff not currently present.  Signs to look for d/c pt.

## 2016-06-07 LAB — HM MAMMOGRAPHY

## 2016-06-11 ENCOUNTER — Ambulatory Visit (INDEPENDENT_AMBULATORY_CARE_PROVIDER_SITE_OTHER): Payer: BC Managed Care – PPO | Admitting: Family Medicine

## 2016-06-11 ENCOUNTER — Encounter: Payer: Self-pay | Admitting: Family Medicine

## 2016-06-11 VITALS — BP 116/75 | HR 84 | Ht 64.0 in | Wt 268.0 lb

## 2016-06-11 DIAGNOSIS — I10 Essential (primary) hypertension: Secondary | ICD-10-CM

## 2016-06-11 DIAGNOSIS — E559 Vitamin D deficiency, unspecified: Secondary | ICD-10-CM | POA: Diagnosis not present

## 2016-06-11 DIAGNOSIS — R638 Other symptoms and signs concerning food and fluid intake: Secondary | ICD-10-CM

## 2016-06-11 DIAGNOSIS — R7301 Impaired fasting glucose: Secondary | ICD-10-CM

## 2016-06-11 NOTE — Progress Notes (Signed)
Impression and Recommendations:    1. New Onset- Essential hypertension   2. Elevated fasting blood sugar   3. (BMI >= 40)   4. Vitamin D insufficiency   5. Salt craving     New Onset- Essential hypertension - After discussion with patient we will continue chlorthalidone.     Obtain BMP today  -We'll trial quarter tablet in the early morning and quarter tablet early afternoon per pt's preference.  She bought a lot of meds and doesn't want to waste money/meds  - Continue home blood pressure monitoring  - Handouts on low salt diet and DASH diet printed off Internet and handed to patient as well as info in AVS  (BMI >= 40) Encouraged [employer] - knows if loses wt- will help w BP.   Vitamin D insufficiency Continue supplementation daily  Obtain vitamin D level today  Salt craving Advised patient to try to drink more water.  Track how many ounces per day of all un-caffeinated beverages and water she takes in.  Discussed with patient we can consider sending her for endocrinology evaluation to rule out medical reason for her lifetime of salt craving   Education and routine counseling performed. Handouts provided.   New Prescriptions   No medications on file    Modified Medications   No medications on file    Discontinued Medications   CYCLOBENZAPRINE (FLEXERIL) 10 MG TABLET    Take 1 tablet (10 mg total) by mouth 3 (three) times daily as needed for muscle spasms.   NAPROXEN (NAPROSYN) 500 MG TABLET    Twice daily with meals as needed only for pain     Return for 6 wks f/up for her Bp/ leg cramps, salt craving.  The patient was counseled, risk factors were discussed, anticipatory guidance given.  Gross side effects, risk and benefits, and alternatives of medications discussed with patient.  Patient is aware that all medications have potential side effects and we are unable to predict every side effect or drug-drug interaction that may occur.  Expresses verbal  understanding and consents to current therapy plan and treatment regimen.  Please see AVS handed out to patient at the end of our visit for further patient instructions/ counseling done pertaining to today's office visit.    Note: This document was prepared using Dragon voice recognition software and may include unintentional dictation errors.     Subjective:    Chief Complaint  Patient presents with  . Follow-up  . Hypertension    HPI: Tiffany Glover is a 55 y.o. female who [facility] up for HTN.  Started diuretic last OV. I rec home bp monitoring - wt loss, dietary changes r/w pt as well.   HTN: Home BP readings have been running in the 120s over 70s to 130s over 80s.    Pt has been tolerating meds, but complains of perineal muscle spasms and pains bilaterally lower extremity, and rarely has lightheadedness in the mornings if her blood pressure goes to 103/72 which is the lowest it's ever been.   .  Taking as prescribed.  Denies HA, dizziness, CP, SOB, Visual changes, increasing pedal edema--> it is actually decreased quite a bit on the medicine which patient is happy about.  Feet/ lower ext hurt less.     Preventitive Healthcare:  Exercise: no  Diet Pattern: fair  Salt Restriction: NO---> loves salt- Even puts it on her salad, pizza, fruit etc.  She has loved salt "all her life."      Patient Care Team    Relationship Specialty Notifications Start End  Thomasene Lot, DO PCP - General Family Medicine  02/12/16      Wt Readings from Last 3 Encounters:  06/11/16 268 lb (121.6 kg)  05/15/16 275 lb 12.8 oz (125.1 kg)  03/25/16 272 lb 4.8 oz (123.5 kg)    BP Readings from Last 3 Encounters:  06/11/16 116/75  05/15/16 (!) 143/85  03/25/16 140/80    Pulse Readings from Last 3 Encounters:  06/11/16 84  05/15/16 93  03/25/16 86    BMI Readings from Last 3 Encounters:  06/11/16 46.00 kg/m  05/15/16 47.34  kg/m  03/25/16 46.74 kg/m     Lab Results  Component Value Date   CREATININE 0.60 03/11/2016   BUN 10 03/11/2016   NA 139 03/11/2016   K 4.7 03/11/2016   CL 106 03/11/2016   CO2 29 03/11/2016    Lab Results  Component Value Date   CHOL 191 03/11/2016    Lab Results  Component Value Date   HDL 50 03/11/2016    Lab Results  Component Value Date   LDLCALC 119 03/11/2016    Lab Results  Component Value Date   TRIG 110 03/11/2016    Lab Results  Component Value Date   CHOLHDL 3.8 03/11/2016    No results found for: LDLDIRECT ===================================================================  Patient Active Problem List   Diagnosis Date Noted  . Salt craving 06/11/2016    Priority: High  . New Onset- Essential hypertension 06/01/2016    Priority: High  . History of smoking 30 pack years- Quit 9/ 1/ 96 02/25/2016    Priority: High  . HLD (hyperlipidemia) 05/17/2014    Priority: High  . Elevated fasting blood sugar 05/17/2014    Priority: High  . (BMI >= 40) 09/14/2013    Priority: High  . Personal history of noncompliance with medical treatment and regimen 02/25/2016    Priority: Medium  . Adiposity 05/17/2014    Priority: Medium  . Hypothyroidism 09/04/2007    Priority: Medium  . Bilateral impacted cerumen 06/01/2016    Priority: Low  . Seborrheic dermatitis- ears, scalp 06/01/2016    Priority: Low  . Counseling on health promotion and disease prevention 06/01/2016    Priority: Low  . Vitamin D insufficiency 03/26/2016    Priority: Low  . History of laparoscopic cholecystectomy 02/25/2016    Priority: Low  . Family history of suicide- father age 10 02/25/2016    Priority: Low  . Family history of alcoholism in father; grandfathers etc 02/25/2016    Priority: Low  . 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 Allergic rhinitis/ seasonal allergies 05/17/2014    Priority: Low  . h/o Hemorrhoids, internal, with bleeding  09/04/2007    Priority: Low  . h/o Hemorrhoids, external 09/04/2007    Priority: Low  . History of diverticulosis 09/04/2007    Priority: Low  . Person with feared health complaint in whom no diagnosis is made 06/01/2016  . Elevated blood pressure reading without diagnosis of hypertension 02/25/2016  . Incisional hernia 09/15/2014  . Incisional hernia, without obstruction or gangrene 08/08/2014  . DUB (dysfunctional uterine bleeding) 05/17/2014    Past Medical History:  Diagnosis Date  . Complication of anesthesia    hard to wake up once  . Hypothyroidism   . Mild stress incontinence   . Morbid obesity with BMI of 40.0-44.9,  adult [facility](HCC)   . Obesity (BMI 30-39.9) 07/25/2012  . Ventral incisional hernia     Past Surgical History:  Procedure Laterality Date  . CESAREAN SECTION    . CESAREAN SECTION    . CHOLECYSTECTOMY  07/21/2012   Procedure: LAPAROSCOPIC CHOLECYSTECTOMY WITH INTRAOPERATIVE CHOLANGIOGRAM;  Surgeon: Emelia LoronMatthew Wakefield, MD;  Location: [facility];  Service: General;  Laterality: N/A;  attempted  . CHOLECYSTECTOMY  07/21/2012   Procedure: CHOLECYSTECTOMY;  Surgeon: Emelia LoronMatthew Wakefield, MD;  Location: [facility];  Service: General;  Laterality: N/A;  . FOOT SURGERY     ganglion cyst removed from right foot  . HERNIA REPAIR  09/2014  . INCISIONAL HERNIA REPAIR N/A 09/15/2014   Procedure: LAPAROSCOPIC INCISIONAL HERNIA REPAIR WITH MESH;  Surgeon: Emelia LoronMatthew Wakefield, MD;  Location: [facility];  Service: General;  Laterality: N/A;  . vein strippiing     left leg    Family History  Problem Relation Age of Onset  . Cancer Mother     Lung  . Suicidality Father   . Alcohol abuse Father   . Cancer Brother     mouth  . Cancer Maternal Grandmother   . Alcohol abuse Maternal Grandfather   . Urolithiasis Son   . Hyperlipidemia Paternal Grandmother   . Hypertension Paternal Grandmother   . Alcohol abuse Paternal Grandfather     History  Drug Use No  ,  History  Alcohol Use  . Yes     Comment: Occassional Use  ,  History  Smoking Status  . Former Smoker  . Packs/day: 1.50  . Years: 20.00  . Quit date: 03/02/1995  Smokeless Tobacco  . Never Used  ,    Current Outpatient Prescriptions on File Prior to Visit  Medication Sig Dispense Refill  . chlorthalidone (HYGROTON) 25 MG tablet Take 0.5 tablets (12.5 mg total) by mouth daily. 60 tablet 0  . Multiple Vitamin (MULTIVITAMIN) tablet Take 1 tablet by mouth daily.    Marland Kitchen. triamcinolone cream (KENALOG) 0.5 % Apply 1 application topically 2 (two) times daily. To affected areas. (Patient taking differently: Apply 1 application topically 2 (two) times daily. To affected areas as needed) 60 g 1   No current facility-administered medications on file prior to visit.     No Known Allergies   Review of Systems  Constitutional: Negative for malaise/fatigue and weight loss.  Eyes: Negative for blurred vision and double vision.  Cardiovascular: Positive for leg swelling. Negative for palpitations, orthopnea, claudication and PND.  Gastrointestinal: Negative for diarrhea, nausea and vomiting.  Musculoskeletal: Positive for myalgias.  Skin: Negative for rash.  Neurological: Positive for dizziness. Negative for sensory change, focal weakness and weakness.  Endo/Heme/Allergies: Does not bruise/bleed easily.    Objective:    Blood pressure 116/75, pulse 84, height 5\' 4"  (1.626 m), weight 268 lb (121.6 kg), last menstrual period 08/13/2014. Body mass index is 46 kg/m. General: Well Developed, well nourished, and in no acute distress.  HEENT: Normocephalic, atraumatic, pupils equal round reactive to light, neck supple, No carotid bruits, no JVD Skin: Warm and dry, cap RF less 2 sec Cardiac: Regular rate and rhythm, S1, S2 WNL's Respiratory: ECTA B/L- distant though, Not using accessory muscles, speaking in full sentences. NeuroM-Sk: Ambulates w/o assistance, moves ext * 4 w/o difficulty, sensation grossly intact.  Ext: mild,  non-pitting edema b/l lower ext- much improved from prior Psych: No HI/SI, judgement and insight good, Euthymic mood. Full Affect.

## 2016-06-11 NOTE — Patient Instructions (Addendum)
Pt would like to stay on same meds since she bought them already and has 4 mo worth and paid $40 for them, she would like to try to change the time of day she takes them and see if that improves the symptoms she is having of some leg cramps, salt craving, and occasional too low of her blood pressure.   She will take a quarter tablet early morning and a quarter tablet in the early afternoon.  We will also recheck her BMP today to ensure she doesn't have an electrolyte abnormality which may be further complicating her cramping etc.  Advised patient to try to drink more water.  Track how many ounces per day of all un -caffeinated beverages and water she takes in.      Hypertension Hypertension, commonly called high blood pressure, is when the force of blood pumping through your arteries is too strong. Your arteries are the blood vessels that carry blood from your heart throughout your body. A blood pressure reading consists of a higher number over a lower number, such as 110/72. The higher number (systolic) is the pressure inside your arteries when your heart pumps. The lower number (diastolic) is the pressure inside your arteries when your heart relaxes. Ideally you want your blood pressure below 120/80. Hypertension forces your heart to work harder to pump blood. Your arteries may become narrow or stiff. Having untreated or uncontrolled hypertension can cause heart attack, stroke, kidney disease, and other problems. What increases the risk? Some risk factors for high blood pressure are controllable. Others are not. Risk factors you cannot control include:  Race. You may be at higher risk if you are African American.  Age. Risk increases with age.  Gender. Men are at higher risk than women before age 56 years. After age 56, women are at higher risk than men. Risk factors you can control include:  Not getting enough exercise or physical activity.  Being overweight.  Getting too much fat,  sugar, calories, or salt in your diet.  Drinking too much alcohol. What are the signs or symptoms? Hypertension does not usually cause signs or symptoms. Extremely high blood pressure (hypertensive crisis) may cause headache, anxiety, shortness of breath, and nosebleed. How is this diagnosed? To check if you have hypertension, your health care provider will measure your blood pressure while you are seated, with your arm held at the level of your heart. It should be measured at least twice using the same arm. Certain conditions can cause a difference in blood pressure between your right and left arms. A blood pressure reading that is higher than normal on one occasion does not mean that you need treatment. If it is not clear whether you have high blood pressure, you may be asked to return on a different day to have your blood pressure checked again. Or, you may be asked to monitor your blood pressure at home for 1 or more weeks. How is this treated? Treating high blood pressure includes making lifestyle changes and possibly taking medicine. Living a healthy lifestyle can help lower high blood pressure. You may need to change some of your habits. Lifestyle changes may include:  Following the DASH diet. This diet is high in fruits, vegetables, and whole grains. It is low in salt, red meat, and added sugars.  Keep your sodium intake below 2,300 mg per day.  Getting at least 30-45 minutes of aerobic exercise at least 4 times per week.  Losing weight if necessary.  Not  smoking.  Limiting alcoholic beverages.  Learning ways to reduce stress. Your health care provider may prescribe medicine if lifestyle changes are not enough to get your blood pressure under control, and if one of the following is true:  You are 6918-56 years of age and your systolic blood pressure is above 140.  You are 56 years of age or older, and your systolic blood pressure is above 150.  Your diastolic blood pressure is  above 90.  You have diabetes, and your systolic blood pressure is over 140 or your diastolic blood pressure is over 90.  You have kidney disease and your blood pressure is above 140/90.  You have heart disease and your blood pressure is above 140/90. Your personal target blood pressure may vary depending on your medical conditions, your age, and other factors. Follow these instructions at home:  Have your blood pressure rechecked as directed by your health care provider.  Take medicines only as directed by your health care provider. Follow the directions carefully. Blood pressure medicines must be taken as prescribed. The medicine does not work as well when you skip doses. Skipping doses also puts you at risk for problems.  Do not smoke.  Monitor your blood pressure at home as directed by your health care provider. Contact a health care provider if:  You think you are having a reaction to medicines taken.  You have recurrent headaches or feel dizzy.  You have swelling in your ankles.  You have trouble with your vision. Get help right away if:  You develop a severe headache or confusion.  You have unusual weakness, numbness, or feel faint.  You have severe chest or abdominal pain.  You vomit repeatedly.  You have trouble breathing. This information is not intended to replace advice given to you by your health care provider. Make sure you discuss any questions you have with your health care provider. Document Released: 06/17/2005 Document Revised: 11/23/2015 Document Reviewed: 04/09/2013 Elsevier Interactive Patient Education  2017 [employer]

## 2016-06-11 NOTE — Assessment & Plan Note (Signed)
Encouraged WW - knows if loses wt- will help w BP.  

## 2016-06-11 NOTE — Assessment & Plan Note (Addendum)
Advised patient to try to drink more water.  Track how many ounces per day of all un-caffeinated beverages and water she takes in.  Discussed with patient we can consider sending her for endocrinology evaluation to rule out medical reason for her lifetime of salt craving

## 2016-06-11 NOTE — Assessment & Plan Note (Signed)
Continue supplementation daily  Obtain vitamin D level today

## 2016-06-11 NOTE — Assessment & Plan Note (Addendum)
-   After discussion with patient we will continue chlorthalidone.     Obtain BMP today  -We'll trial quarter tablet in the early morning and quarter tablet early afternoon per pt's preference.  She bought a lot of meds and doesn't want to waste money/meds  - Continue home blood pressure monitoring  - Handouts on low salt diet and DASH diet printed off Internet and handed to patient as well as info in AVS

## 2016-06-12 LAB — BASIC METABOLIC PANEL
BUN: 17 mg/dL (ref 7–25)
CHLORIDE: 101 mmol/L (ref 98–110)
CO2: 28 mmol/L (ref 20–31)
Calcium: 9.1 mg/dL (ref 8.6–10.4)
Creat: 0.62 mg/dL (ref 0.50–1.05)
Glucose, Bld: 97 mg/dL (ref 65–99)
POTASSIUM: 4.1 mmol/L (ref 3.5–5.3)
Sodium: 139 mmol/L (ref 135–146)

## 2016-06-12 LAB — VITAMIN D 25 HYDROXY (VIT D DEFICIENCY, FRACTURES): Vit D, 25-Hydroxy: 33 ng/mL (ref 30–100)

## 2016-06-26 ENCOUNTER — Telehealth: Payer: Self-pay

## 2016-06-26 NOTE — Telephone Encounter (Signed)
LVM requesting pt to call to discuss lab results. Tiffany NixonAmanda Glover CMA, RT

## 2016-06-26 NOTE — Telephone Encounter (Signed)
Pt informed of results. Pt expressed understanding and is agreeable. Amanda Grissom CMA, RT 

## 2016-07-11 ENCOUNTER — Encounter: Payer: Self-pay | Admitting: Family Medicine

## 2016-07-11 ENCOUNTER — Ambulatory Visit (INDEPENDENT_AMBULATORY_CARE_PROVIDER_SITE_OTHER): Payer: BC Managed Care – PPO | Admitting: Family Medicine

## 2016-07-11 VITALS — BP 119/76 | HR 84 | Ht 64.0 in | Wt 269.3 lb

## 2016-07-11 DIAGNOSIS — E559 Vitamin D deficiency, unspecified: Secondary | ICD-10-CM | POA: Diagnosis not present

## 2016-07-11 DIAGNOSIS — R7301 Impaired fasting glucose: Secondary | ICD-10-CM

## 2016-07-11 DIAGNOSIS — R638 Other symptoms and signs concerning food and fluid intake: Secondary | ICD-10-CM | POA: Diagnosis not present

## 2016-07-11 DIAGNOSIS — I10 Essential (primary) hypertension: Secondary | ICD-10-CM | POA: Diagnosis not present

## 2016-07-11 DIAGNOSIS — Z7189 Other specified counseling: Secondary | ICD-10-CM | POA: Diagnosis not present

## 2016-07-11 MED ORDER — VITAMIN D3 125 MCG (5000 UT) PO TABS: ORAL_TABLET | ORAL | 3 refills | Status: AC

## 2016-07-11 NOTE — Patient Instructions (Signed)
-   for nonspecific abdominal symptoms I would follow-up with the general surgeon who put a mesh in there about a year ago and also please call and get your colonoscopy.

## 2016-07-11 NOTE — Progress Notes (Signed)
Impression and Recommendations:    1. Vitamin D insufficiency   2. Hypertension, benign essential, goal below 140/90   3. (BMI >= 40)   4. Elevated fasting blood sugar   5. Salt craving   6. Counseling on health promotion and disease prevention     1) Inc oral intake vit D to 5k IU QD 2) pt declines med change for better BP control and wishes to work further on diet/ exercise.  Goal- 130/80 or less. Cont to monitor at home and f/up sooner than planned if not well controlled 3) wt loss--- use lose it or [employer] 4) low glycemic index foods 5) cont to push fluids, less po salt  Education and routine counseling performed. Handouts provided.  --> at end of OV today pt mentioned some nonspecific abdominal symptoms.  I would follow-up with the general surgeon who put a mesh in there about a year ago and also please call and get your colonoscopy done.  Eating ok, no N/V/D etc  Return in about 4 months (around 11/08/2016) for BP, wt loss, helathy habits.  The patient was counseled, risk factors were discussed, anticipatory guidance given.  Gross side effects, risk and benefits, and alternatives of medications discussed with patient.  Patient is aware that all medications have potential side effects and we are unable to predict every side effect or drug-drug interaction that may occur.  Expresses verbal understanding and consents to current therapy plan and treatment regimen.  Please see AVS handed out to patient at the end of our visit for further patient instructions/ counseling done pertaining to today's office visit.    Note: This document was prepared using Dragon voice recognition software and may include unintentional dictation errors.     Subjective:    Chief Complaint  Patient presents with  . Hypertension    HPI: Tiffany Glover is a 56 y.o. female [facility] for follow up for HTN and to review recent labs in Dec '17 that  were done in person.     Vit D:  Went from 27 months ago, to recently 3333.  So still not at goal.  HTN: Home BP readings have been running in the 130 or over on top and under 90 on btm.   Pt has been tolerating meds well.  Taking as prescribed.  Denies HA, dizziness, CP, SOB, Visual changes, increasing pedal edema.   She has increased her fluids/ water --> cramps have gone away in legs now.   The Salt urge she c/o in the past has subsided some now.  Eating less salt.     Patient Care Team    Relationship Specialty Notifications Start End  Thomasene Lotpalski, Rajvir Ernster, DO PCP - General Family Medicine  02/12/16     Lab Results  Component Value Date   CREATININE 0.62 06/11/2016   BUN 17 06/11/2016   NA 139 06/11/2016   K 4.1 06/11/2016   CL 101 06/11/2016   CO2 28 06/11/2016    Lab Results  Component Value Date   CHOL 191 03/11/2016    Lab Results  Component Value Date   HDL 50 03/11/2016    Lab Results  Component Value Date   LDLCALC 119 03/11/2016    Lab Results  Component Value Date   TRIG 110 03/11/2016    Lab Results  Component Value Date   CHOLHDL 3.8 03/11/2016    No results found for: LDLDIRECT ===================================================================  Patient Active Problem List   Diagnosis Date Noted  . Salt craving 06/11/2016    Priority: High  . New Onset- Essential hypertension 06/01/2016    Priority: High  . History of smoking 30 pack years- Quit 9/ 1/ 96 02/25/2016    Priority: High  . HLD (hyperlipidemia) 05/17/2014    Priority: High  . Elevated fasting blood sugar 05/17/2014    Priority: High  . (BMI >= 40) 09/14/2013    Priority: High  . Plantar fascial fibromatosis of right foot 11/10/2016    Priority: Medium  . Personal history of noncompliance with medical treatment and regimen 02/25/2016    Priority: Medium  . Adiposity 05/17/2014    Priority: Medium  . Hypothyroidism 09/04/2007    Priority: Medium  . Muscle cramps in legs  b/l 11/10/2016    Priority: Low  . Bilateral impacted cerumen 06/01/2016    Priority: Low  . Seborrheic dermatitis- ears, scalp 06/01/2016    Priority: Low  . Counseling on health promotion and disease prevention 06/01/2016    Priority: Low  . Vitamin D insufficiency 03/26/2016    Priority: Low  . History of laparoscopic cholecystectomy 02/25/2016    Priority: Low  . Family history of suicide- father age 15 02/25/2016    Priority: Low  . Family history of alcoholism in father; grandfathers etc 02/25/2016    Priority: Low  . 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 Allergic rhinitis/ seasonal allergies 05/17/2014    Priority: Low  . h/o Hemorrhoids, internal, with bleeding 09/04/2007    Priority: Low  . h/o Hemorrhoids, external 09/04/2007    Priority: Low  . History of diverticulosis 09/04/2007    Priority: Low  . Person with feared health complaint in whom no diagnosis is made 06/01/2016  . Elevated blood pressure reading without diagnosis of hypertension 02/25/2016  . Incisional hernia 09/15/2014  . Incisional hernia, without obstruction or gangrene 08/08/2014  . DUB (dysfunctional uterine bleeding) 05/17/2014    Past Medical History:  Diagnosis Date  . Complication of anesthesia    hard to wake up once  . Hypothyroidism   . Mild stress incontinence   . Morbid obesity with BMI of 40.0-44.9, adult (HCC)   . Obesity (BMI 30-39.9) 07/25/2012  . Ventral incisional hernia     Past Surgical History:  Procedure Laterality Date  . CESAREAN SECTION    . CESAREAN SECTION    . CHOLECYSTECTOMY  07/21/2012   Procedure: LAPAROSCOPIC CHOLECYSTECTOMY WITH INTRAOPERATIVE CHOLANGIOGRAM;  Surgeon: Emelia Loron, MD;  Location: [facility];  Service: General;  Laterality: N/A;  attempted  . CHOLECYSTECTOMY  07/21/2012   Procedure: CHOLECYSTECTOMY;  Surgeon: Emelia Loron, MD;  Location: [facility];  Service: General;  Laterality: N/A;  . FOOT SURGERY     ganglion cyst  removed from right foot  . HERNIA REPAIR  09/2014  . INCISIONAL HERNIA REPAIR N/A 09/15/2014   Procedure: LAPAROSCOPIC INCISIONAL HERNIA REPAIR WITH MESH;  Surgeon: Emelia Loron, MD;  Location: [facility];  Service: General;  Laterality: N/A;  . vein strippiing     left leg    Family History  Problem Relation Age of Onset  . Cancer Mother        Lung  . Suicidality Father   . Alcohol abuse Father   . Cancer Brother        mouth  . Cancer Maternal Grandmother   . Alcohol abuse Maternal Grandfather   . Urolithiasis Son   .  Hyperlipidemia Paternal Grandmother   . Hypertension Paternal Grandmother   . Alcohol abuse Paternal Grandfather     History  Drug Use No  ,  History  Alcohol Use  . Yes    Comment: Occassional Use  ,  History  Smoking Status  . Former Smoker  . Packs/day: 1.50  . Years: 20.00  . Quit date: 03/02/1995  Smokeless Tobacco  . Never Used  ,    Current Outpatient Prescriptions on File Prior to Visit  Medication Sig Dispense Refill  . Multiple Vitamin (MULTIVITAMIN) tablet Take 1 tablet by mouth daily.    Marland Kitchen triamcinolone cream (KENALOG) 0.5 % Apply 1 application topically 2 (two) times daily. To affected areas. (Patient taking differently: Apply 1 application topically 2 (two) times daily. To affected areas as needed) 60 g 1   No current facility-administered medications on file prior to visit.     No Known Allergies  Review of Systems  Constitutional: Negative for diaphoresis and weight loss.  HENT: Negative for nosebleeds.   Eyes: Negative for blurred vision and double vision.  Respiratory: Negative for shortness of breath and wheezing.   Cardiovascular: Negative for chest pain, palpitations, orthopnea and claudication.  Gastrointestinal: Negative for diarrhea, nausea and vomiting.  Musculoskeletal: Negative for falls and myalgias.  Skin: Negative for rash.  Neurological: Negative for dizziness and focal weakness.  Endo/Heme/Allergies: Negative  for polydipsia.  Psychiatric/Behavioral: Negative for memory loss.    Objective:   Blood pressure 119/76, pulse 84, height 5\' 4"  (1.626 m), weight 269 lb 4.8 oz (122.2 kg), last menstrual period 09/08/2014. Body mass index is 46.23 kg/m. General: Well Developed, well nourished, and in no acute distress.  HEENT: Normocephalic, atraumatic, pupils equal round reactive to light, neck supple, No carotid bruits, no JVD Skin: Warm and dry, cap RF less 2 sec Cardiac: Regular rate and rhythm, S1, S2 WNL's, no murmurs rubs or gallops Respiratory: ECTA B/L, Not using accessory muscles, speaking in full sentences. NeuroM-Sk: Ambulates w/o assistance, moves ext * 4 w/o difficulty, sensation grossly intact.  Ext: non-pit edema b/l lower ext- less than prior Psych: No HI/SI, judgement and insight good, Euthymic mood. Full Affect.

## 2016-09-16 ENCOUNTER — Other Ambulatory Visit: Payer: Self-pay | Admitting: Family Medicine

## 2016-09-16 NOTE — Telephone Encounter (Signed)
Pt has not been RX'd these medications since September, 2017.  Please refill if appropriate.  Tiajuana Amass. Sherilyn Windhorst, CMA

## 2016-09-16 NOTE — Telephone Encounter (Signed)
One refill authorized.  If she needs additional medication, please schedule an appt. With dr. Sharee Holsterpalski.

## 2016-10-01 ENCOUNTER — Other Ambulatory Visit: Payer: Self-pay | Admitting: Family Medicine

## 2016-11-05 ENCOUNTER — Encounter: Payer: Self-pay | Admitting: Family Medicine

## 2016-11-05 ENCOUNTER — Ambulatory Visit (INDEPENDENT_AMBULATORY_CARE_PROVIDER_SITE_OTHER): Payer: BC Managed Care – PPO | Admitting: Family Medicine

## 2016-11-05 VITALS — BP 119/84 | HR 86 | Ht 64.0 in | Wt 264.2 lb

## 2016-11-05 DIAGNOSIS — J301 Allergic rhinitis due to pollen: Secondary | ICD-10-CM

## 2016-11-05 DIAGNOSIS — E782 Mixed hyperlipidemia: Secondary | ICD-10-CM

## 2016-11-05 DIAGNOSIS — R7301 Impaired fasting glucose: Secondary | ICD-10-CM | POA: Diagnosis not present

## 2016-11-05 DIAGNOSIS — R252 Cramp and spasm: Secondary | ICD-10-CM

## 2016-11-05 DIAGNOSIS — R638 Other symptoms and signs concerning food and fluid intake: Secondary | ICD-10-CM

## 2016-11-05 DIAGNOSIS — M722 Plantar fascial fibromatosis: Secondary | ICD-10-CM | POA: Diagnosis not present

## 2016-11-05 DIAGNOSIS — I10 Essential (primary) hypertension: Secondary | ICD-10-CM | POA: Diagnosis not present

## 2016-11-05 NOTE — Patient Instructions (Addendum)
Track all food/ fluid intake on lose it for one month or so--> RTC for eval of diet/ nutritional intake  - depending, we may consider referral to Dr Quillian Quince at [facility] or Dr Cathey Endow at [place] in [place].  Look over the wt loss meds we discussed below--> will talk about them next OV.       Phentermine  While taking the medication we may ask that you come into the office once a month or once every 2-3 months to monitor your weight, blood pressure, and heart rate. In addition we can help answer your questions about diet, exercise, and help you every step of the way with your weight loss journey. Sometime it is helpful if you bring in a food diary or use an app on your phone such as myfitnesspal to record your calorie intake, especially in the beginning.   You can start out on 1/3 to 1/2 a pill in the morning and if you are tolerating it well you can increase to one pill daily. I also have some patients that take 1/3 or 1/2 at lunch to help prevent night time eating.  This medication is cheapest CASH pay at [facility] OR [facility] [facility] is 14-17 dollars and you do NOT need a membership to get meds from there.    What is this medicine? PHENTERMINE (FEN ter meen) decreases your appetite. This medicine is intended to be used in addition to a healthy reduced calorie diet and exercise. The best results are achieved this way. This medicine is only indicated for short-term use. Eventually your weight loss may level out and the medication will no longer be needed.   How should I use this medicine? Take this medicine by mouth. Follow the directions on the prescription label. The tablets should stay in the bottle until immediately before you take your dose. Take your doses at regular intervals. Do not take your medicine more often than directed.  Overdosage: If you think you have taken too much of this medicine contact a poison control center or emergency room at once. NOTE: This medicine is only  for you. Do not share this medicine with others.  What if I miss a dose? If you miss a dose, take it as soon as you can. If it is almost time for your next dose, take only that dose. Do not take double or extra doses. Do not increase or in any way change your dose without consulting your doctor.  What should I watch for while using this medicine? Notify your physician immediately if you become short of breath while doing your normal activities. Do not take this medicine within 6 hours of bedtime. It can keep you from getting to sleep. Avoid drinks that contain caffeine and try to stick to a regular bedtime every night. Do not stand or sit up quickly, especially if you are an older patient. This reduces the risk of dizzy or fainting spells. Avoid alcoholic drinks.  What side effects may I notice from receiving this medicine? Side effects that you should report to your doctor or health care professional as soon as possible: -chest pain, palpitations -depression or severe changes in mood -increased blood pressure -irritability -nervousness or restlessness -severe dizziness -shortness of breath -problems urinating -unusual swelling of the legs -vomiting  Side effects that usually do not require medical attention (report to your doctor or health care professional if they continue or are bothersome): -blurred vision or other eye problems -changes in sexual ability  or desire -constipation or diarrhea -difficulty sleeping -dry mouth or unpleasant taste -headache -nausea This list may not describe all possible side effects. Call your doctor for medical advice about side effects. You may report side effects to FDA at 1-800-FDA-1088.   Bupropion; Naltrexone extended-release tablets What is this medicine? BUPROPION; NALTREXONE (byoo PROE pee on; nal TREX one) is a combination product used to promote and maintain weight loss in obese adults or overweight adults who also have weight related  medical problems. This medicine should be used with a reduced calorie diet and increased physical activity. This medicine may be used for other purposes; ask your health care provider or pharmacist if you have questions. COMMON BRAND NAME(S): CONTRAVE What should I tell my health care provider before I take this medicine? They need to know if you have any of these conditions: -an eating disorder, such as anorexia or bulimia -bipolar disorder -diabetes -depression -drug abuse or addiction -glaucoma -head injury -heart disease -high blood pressure -history of a tumor or infection of your brain or spine -history of stroke -history of irregular heartbeat -if you often drink alcohol -kidney disease -liver disease -schizophrenia -seizures -suicidal thoughts, plans, or attempt; a previous suicide attempt by you or a family member -an unusual or allergic reaction to bupropion, naltrexone, other medicines, foods, dyes, or preservatives -breast-feeding -pregnant or trying to become pregnant How should I use this medicine? Take this medicine by mouth with a glass of water. Follow the directions on the prescription label. Take this medicine in the morning and in the evenings as directed by your healthcare professional. Bonita Quin can take it with or without food. Do not take with high-fat meals as this may increase your risk of seizures. Do not crush, chew, or cut these tablets. Do not take your medicine more often than directed. Do not stop taking this medicine suddenly except upon the advice of your doctor. A special MedGuide will be given to you by the pharmacist with each prescription and refill. Be sure to read this information carefully each time. Talk to your pediatrician regarding the use of this medicine in children. Special care may be needed. Overdosage: If you think you have taken too much of this medicine contact a poison control center or emergency room at once. NOTE: This medicine is only  for you. Do not share this medicine with others. What if I miss a dose? If you miss a dose, skip the missed dose and take your next tablet at the regular time. Do not take double or extra doses. What may interact with this medicine? Do not take this medicine with any of the following medications: -any prescription or street opioid drug like codeine, heroin, methadone -linezolid -MAOIs like Carbex, Eldepryl, Marplan, Nardil, and Parnate -methylene blue (injected into a vein) -other medicines that contain bupropion like Zyban or Wellbutrin This medicine may also interact with the following medications: -alcohol -certain medicines for anxiety or sleep -certain medicines for blood pressure like metoprolol, propranolol -certain medicines for depression or psychotic disturbances -certain medicines for HIV or AIDS like efavirenz, lopinavir, nelfinavir, ritonavir -certain medicines for irregular heart beat like propafenone, flecainide -certain medicines for Parkinson's disease like amantadine, levodopa -certain medicines for seizures like carbamazepine, phenytoin, phenobarbital -cimetidine -clopidogrel -cyclophosphamide -digoxin -disulfiram -furazolidone -isoniazid -nicotine -orphenadrine -procarbazine -steroid medicines like prednisone or cortisone -stimulant medicines for attention disorders, weight loss, or to stay awake -tamoxifen -theophylline -thioridazine -thiotepa -ticlopidine -tramadol -warfarin This list may not describe all possible interactions. Give your health  care provider a list of all the medicines, herbs, non-prescription drugs, or dietary supplements you use. Also tell them if you smoke, drink alcohol, or use illegal drugs. Some items may interact with your medicine. What should I watch for while using this medicine? This medicine is intended to be used in addition to a healthy diet and appropriate exercise. The best results are achieved this way. Do not increase or  in any way change your dose without consulting your doctor or health care professional. Do not take this medicine with other prescription or over-the-counter weight loss products without consulting your doctor or health care professional. Your doctor should tell you to stop taking this medicine if you do not lose a certain amount of weight within the first 12 weeks of treatment. Visit your doctor or health care professional for regular checkups. Your doctor may order blood tests or other tests to see how you are doing. This medicine may affect blood sugar levels. If you have diabetes, check with your doctor or health care professional before you change your diet or the dose of your diabetic medicine. Patients and their families should watch out for new or worsening depression or thoughts of suicide. Also watch out for sudden changes in feelings such as feeling anxious, agitated, panicky, irritable, hostile, aggressive, impulsive, severely restless, overly excited and hyperactive, or not being able to sleep. If this happens, especially at the beginning of treatment or after a change in dose, call your health care professional. Avoid alcoholic drinks while taking this medicine. Drinking large amounts of alcoholic beverages, using sleeping or anxiety medicines, or quickly stopping the use of these agents while taking this medicine may increase your risk for a seizure. What side effects may I notice from receiving this medicine? Side effects that you should report to your doctor or health care professional as soon as possible: -allergic reactions like skin rash, itching or hives, swelling of the face, lips, or tongue -breathing problems -changes in vision -confusion -elevated mood, decreased need for sleep, racing thoughts, impulsive behavior -fast or irregular heartbeat -hallucinations, loss of contact with reality -increased blood pressure -redness, blistering, peeling or loosening of the skin,  including inside the mouth -seizures -signs and symptoms of liver injury like dark yellow or brown urine; general ill feeling or flu-like symptoms; light-colored stools; loss of appetite; nausea; right upper belly pain; unusually weak or tired; yellowing of the eyes or skin -suicidal thoughts or other mood changes -vomiting Side effects that usually do not require medical attention (report to your doctor or health care professional if they continue or are bothersome): -constipation -headache -loss of appetite -indigestion, stomach upset -tremors This list may not describe all possible side effects. Call your doctor for medical advice about side effects. You may report side effects to FDA at 1-800-FDA-1088. Where should I keep my medicine? Keep out of the reach of children. Store at room temperature between 15 and 30 degrees C (59 and 86 degrees F). Throw away any unused medicine after the expiration date. NOTE: This sheet is a summary. It may not cover all possible information. If you have questions about this medicine, talk to your doctor, pharmacist, or health care provider.  2018 Elsevier/Gold Standard (2015-12-08 13:42:58)    Guidelines for Losing Weight We want weight loss that will last so you should lose 1-2 pounds a week.  THAT IS IT! Please pick THREE things a month to change. Once it is a habit check off the item. Then pick another  three items off the list to become habits.  If you are already doing a habit on the list GREAT!  Cross that item off!  Don't drink your calories. Ie, alcohol, soda, fruit juice, and sweet tea.   Drink more water. Drink a glass when you feel hungry or before each meal.   Eat breakfast - Complex carb and protein (likeDannon light and fit yogurt, oatmeal, fruit, eggs, [place] bacon).  Measure your cereal.  Eat no more than one cup a day. (ie Kashi)  Eat an apple a day.  Add a vegetable a day.  Try a new vegetable a month.  Use Pam! Stop using  oil or butter to cook.  Don't finish your plate or use smaller plates.  Share your dessert.  Eat sugar free Jello for dessert or frozen grapes.  Don't eat 2-3 hours before bed.  Switch to whole wheat bread, pasta, and brown rice.  Make healthier choices when you eat out. No fries!  Pick baked chicken, NOT fried.  Don't forget to SLOW DOWN when you eat. It is not going anywhere.   Take the stairs.  Park far away in the parking lot  Lift soup cans (or weights) for 10 minutes while watching TV.  Walk at work for 10 minutes during break.  Walk outside 1 time a week with your friend, kids, dog, or significant other.  Start a walking group at church.  Walk the mall as much as you can tolerate.   Keep a food diary.  Weigh yourself daily.  Walk for 15 minutes 3 days per week.  Cook at home more often and eat out less. If life happens and you go back to old habits, it is okay.  Just start over. You can do it!  If you experience chest pain, get short of breath, or tired during the exercise, please stop immediately and inform your doctor.    Before you even begin to attack a weight-loss plan, it pays to remember this: You are not fat. You have fat. Losing weight isn't about blame or shame; it's simply another achievement to accomplish. Dieting is like any other skill-you have to buckle down and work at it. As long as you act in a smart, reasonable way, you'll ultimately get where you want to be. Here are some weight loss pearls for you.   1. It's Not a Diet. It's a Lifestyle Thinking of a diet as something you're on and suffering through only for the short term doesn't work. To shed weight and keep it off, you need to make permanent changes to the way you eat. It's OK to indulge occasionally, of course, but if you cut calories temporarily and then revert to your old way of eating, you'll gain back the weight quicker than you can say yo-yo. Use it to lose it. Research shows that  one of the best predictors of long-term weight loss is how many pounds you drop in the first month. For that reason, nutritionists often suggest being stricter for the first two weeks of your new eating strategy to build momentum. Cut out added sugar and alcohol and avoid unrefined carbs. After that, figure out how you can reincorporate them in a way that's healthy and maintainable.  2. There's a Right Way to Exercise Working out burns calories and fat and boosts your metabolism by building muscle. But those trying to lose weight are notorious for overestimating the number of calories they burn and underestimating the amount they take  in. Unfortunately, your system is biologically programmed to hold on to extra pounds and that means when you start exercising, your body senses the deficit and ramps up its hunger signals. If you're not diligent, you'll eat everything you burn and then some. Use it, to lose it. Cardio gets all the exercise glory, but strength and interval training are the real heroes. They help you build lean muscle, which in turn increases your metabolism and calorie-burning ability 3. Don't Overreact to Mild Hunger Some people have a hard time losing weight because of hunger anxiety. To them, being hungry is bad-something to be avoided at all costs-so they carry snacks with them and eat when they don't need to. Others eat because they're stressed out or bored. While you never want to get to the point of being ravenous (that's when bingeing is likely to happen), a hunger pang, a craving, or the fact that it's 3:00 p.m. should not send you racing for the vending machine or obsessing about the energy bar in your purse. Ideally, you should put off eating until your stomach is growling and it's difficult to concentrate.  Use it to lose it. When you feel the urge to eat, use the HALT method. Ask yourself, Am I really hungry? Or am I angry or anxious, lonely or bored, or tired? If you're still not  certain, try the apple test. If you're truly hungry, an apple should seem delicious; if it doesn't, something else is going on. Or you can try drinking water and making yourself busy, if you are still hungry try a healthy snack.  4. Not All Calories Are Created Equal The mechanics of weight loss are pretty simple: Take in fewer calories than you use for energy. But the kind of food you eat makes all the difference. Processed food that's high in saturated fat and refined starch or sugar can cause inflammation that disrupts the hormone signals that tell your brain you're full. The result: You eat a lot more.  Use it to lose it. Clean up your diet. Swap in whole, unprocessed foods, including vegetables, lean protein, and healthy fats that will fill you up and give you the biggest nutritional bang for your calorie buck. In a few weeks, as your brain starts receiving regular hunger and fullness signals once again, you'll notice that you feel less hungry overall and naturally start cutting back on the amount you eat.  5. Protein, Produce, and Plant-Based Fats Are Your Weight-Loss Trinity Here's why eating the three Ps regularly will help you drop pounds. Protein fills you up. You need it to build lean muscle, which keeps your metabolism humming so that you can torch more fat. People in a weight-loss program who ate double the recommended daily allowance for protein (about 110 grams for a 150-pound woman) lost 70 percent of their weight from fat, while people who ate the RDA lost only about 40 percent, one study found. Produce is packed with filling fiber. "It's very difficult to consume too many calories if you're eating a lot of vegetables. Example: Three cups of broccoli is a lot of food, yet only 93 calories. (Fruit is another story. It can be easy to overeat and can contain a lot of calories from sugar, so be sure to monitor your intake.) Plant-based fats like olive oil and those in avocados and nuts are  healthy and extra satiating.  Use it to lose it. Aim to incorporate each of the three Ps into every meal and snack.  People who eat protein throughout the day are able to keep weight off, according to a study in the American Journal of Clinical Nutrition. In addition to meat, poultry and seafood, good sources are beans, lentils, eggs, tofu, and yogurt. As for fat, keep portion sizes in check by measuring out salad dressing, oil, and nut butters (shoot for one to two tablespoons). Finally, eat veggies or a little fruit at every meal. People who did that consumed 308 fewer calories but didn't feel any hungrier than when they didn't eat more produce.  7. How You Eat Is As Important As What You Eat In order for your brain to register that you're full, you need to focus on what you're eating. Sit down whenever you eat, preferably at a table. Turn off the TV or computer, put down your phone, and look at your food. Smell it. Chew slowly, and don't put another bite on your fork until you swallow. When women ate lunch this attentively, they consumed 30 percent less when snacking later than those who listened to an audiobook at lunchtime, according to a study in the [place] Journal of Nutrition. 8. Weighing Yourself Really Works The scale provides the best evidence about whether your efforts are paying off. Seeing the numbers tick up or down or stagnate is motivation to keep going-or to rethink your approach. A 2015 study at [facility] found that daily weigh-ins helped people lose more weight, keep it off, and maintain that loss, even after two years. Use it to lose it. Step on the scale at the same time every day for the best results. If your weight shoots up several pounds from one weigh-in to the next, don't freak out. Eating a lot of salt the night before or having your period is the likely culprit. The number should return to normal in a day or two. It's a steady climb that you need to do something  about. 9. Too Much Stress and Too Little Sleep Are Your Enemies When you're tired and frazzled, your body cranks up the production of cortisol, the stress hormone that can cause carb cravings. Not getting enough sleep also boosts your levels of ghrelin, a hormone associated with hunger, while suppressing leptin, a hormone that signals fullness and satiety. People on a diet who slept only five and a half hours a night for two weeks lost 55 percent less fat and were hungrier than those who slept eight and a half hours, according to a study in the [place] [employer] Journal. Use it to lose it. Prioritize sleep, aiming for seven hours or more a night, which research shows helps lower stress. And make sure you're getting quality zzz's. If a snoring spouse or a fidgety cat wakes you up frequently throughout the night, you may end up getting the equivalent of just four hours of sleep, according to a study from [facility] [employer]. Keep pets out of the bedroom, and use a white-noise app to drown out snoring. 10. You Will Hit a plateau-And You Can Bust Through It As you slim down, your body releases much less leptin, the fullness hormone.  If you're not strength training, start right now. Building muscle can raise your metabolism to help you overcome a plateau. To keep your body challenged and burning calories, incorporate new moves and more intense intervals into your workouts or add another sweat session to your weekly routine. Alternatively, cut an extra 100 calories or so a day from your diet. Now that you've lost  weight, your body simply doesn't need as much fuel.      Since food equals calories, in order to lose weight you must either eat fewer calories, exercise more to burn off calories with activity, or both. Food that is not used to fuel the body is stored as fat. A major component of losing weight is to make smarter food choices. Here's how:  1)   Limit non-nutritious foods, such  as: Sugar, honey, syrups and candy Pastries, donuts, pies, cakes and cookies Soft drinks, sweetened juices and alcoholic beverages  2)  Cut down on high-fat foods by: - Choosing poultry, fish or lean red meat - Choosing low-fat cooking methods, such as baking, broiling, steaming, grilling and boiling - Using low-fat or non-fat dairy products - Using vinaigrette, herbs, lemon or fat-free salad dressings - Avoiding fatty meats, such as bacon, sausage, franks, ribs and luncheon meats - Avoiding high-fat snacks like nuts, chips and chocolate - Avoiding fried foods - Using less butter, margarine, oil and mayonnaise - Avoiding high-fat gravies, cream sauces and cream-based soups  3) Eat a variety of foods, including: - Fruit and vegetables that are raw, steamed or baked - Whole grains, breads, cereal, rice and pasta - Dairy products, such as low-fat or non-fat milk or yogurt, low-fat cottage cheese and low-fat cheese - Protein-rich foods like chicken, [place], fish, lean meat and legumes, or beans  4) Change your eating habits by: - Eat three balanced meals a day to help control your hunger - Watch portion sizes and eat small servings of a variety of foods - Choose low-calorie snacks - Eat only when you are hungry and stop when you are satisfied - Eat slowly and try not to perform other tasks while eating - Find other activities to distract you from food, such as walking, taking up a hobby or being involved in the community - Include regular exercise in your daily routine ( minimum of 20 min of moderate-intensity exercise at least 5 days/week)  - Find a support group, if necessary, for emotional support in your weight loss journey         Easy ways to cut 100 calories   1. Eat your eggs with hot sauce OR salsa instead of cheese.  Eggs are great for breakfast, but many people consider eggs and cheese to be BFFs. Instead of cheese-1 oz. of cheddar has 114 calories-top your eggs with  hot sauce, which contains no calories and helps with satiety and metabolism. Salsa is also a great option!!  2. Top your toast, waffles or pancakes with fresh berries instead of jelly or syrup. Half a cup of berries-fresh, frozen or thawed-has about 40 calories, compared with 2 tbsp. of maple syrup or jelly, which both have about 100 calories. The berries will also give you a good punch of fiber, which helps keep you full and satisfied and won't spike blood sugar quickly like the jelly or syrup. 3. Swap the non-fat latte for black coffee with a splash of half-and-half. Contrary to its name, that non-fat latte has 130 calories and a startling 19g of carbohydrates per 16 oz. serving. Replacing that 'light' drinkable dessert with a black coffee with a splash of half-and-half saves you more than 100 calories per 16 oz. serving. 4. Sprinkle salads with freeze-dried raspberries instead of dried cranberries. If you want a sweet addition to your nutritious salad, stay away from dried cranberries. They have a whopping 130 calories per  cup and 30g carbohydrates. Instead, sprinkle  freeze-dried raspberries guilt-free and save more than 100 calories per  cup serving, adding 3g of belly-filling fiber. 5. Go for mustard in place of mayo on your sandwich. Mustard can add really nice flavor to any sandwich, and there are tons of varieties, from spicy to honey. A serving of mayo is 95 calories, versus 10 calories in a serving of mustard.  Or try an avocado mayo spread: You can find the recipe few click this link: https://www.californiaavocado.com/recipes/recipe-container/california-avocado-mayo 6. Choose a DIY salad dressing instead of the store-bought kind. Mix Dijon or whole grain mustard with low-fat Kefir or red wine vinegar and garlic. 7. Use hummus as a spread instead of a dip. Use hummus as a spread on a high-fiber cracker or tortilla with a sandwich and save on calories without sacrificing taste. 8. Pick  just one salad "accessory." Salad isn't automatically a calorie winner. It's easy to over-accessorize with toppings. Instead of topping your salad with nuts, avocado and cranberries (all three will clock in at 313 calories), just pick one. The next day, choose a different accessory, which will also keep your salad interesting. You don't wear all your jewelry every day, right? 9. Ditch the white pasta in favor of spaghetti squash. One cup of cooked spaghetti squash has about 40 calories, compared with traditional spaghetti, which comes with more than 200. Spaghetti squash is also nutrient-dense. It's a good source of fiber and Vitamins A and C, and it can be eaten just like you would eat pasta-with a great tomato sauce and [place] meatballs or with pesto, tofu and spinach, for example. 10. Dress up your chili, soups and stews with non-fat [place] yogurt instead of sour cream. Just a 'dollop' of sour cream can set you back 115 calories and a whopping 12g of fat-seven of which are of the artery-clogging variety. Added bonus: [place] yogurt is packed with muscle-building protein, calcium and B Vitamins. 11. Mash cauliflower instead of mashed potatoes. One cup of traditional mashed potatoes-in all their creamy goodness-has more than 200 calories, compared to mashed cauliflower, which you can typically eat for less than 100 calories per 1 cup serving. Cauliflower is a great source of the antioxidant indole-3-carbinol (I3C), which may help reduce the risk of some cancers, like breast cancer. 12. Ditch the ice cream sundae in favor of a [place] yogurt parfait. Instead of a cup of ice cream or fro-yo for dessert, try 1 cup of nonfat Greek yogurt topped with fresh berries and a sprinkle of cacao nibs. Both toppings are packed with antioxidants, which can help reduce cellular inflammation and oxidative damage. And the comparison is a no-brainer: One cup of ice cream has about 275 calories; one cup of frozen yogurt has about  230; and a cup of Greek yogurt has just 130, plus twice the protein, so you're less likely to return to the freezer for a second helping. 13. Put olive oil in a spray container instead of using it directly from the bottle. Each tablespoon of olive oil is 120 calories and 15g of fat. Use a mister instead of pouring it straight into the pan or onto a salad. This allows for portion control and will save you more than 100 calories. 14. When baking, substitute canned pumpkin for butter or oil. Canned pumpkin-not pumpkin pie mix-is loaded with Vitamin A, which is important for skin and eye health, as well as immunity. And the comparisons are pretty crazy:  cup of canned pumpkin has about 40 calories, compared to butter or oil,  which has more than 800 calories. Yes, 800 calories. Applesauce and mashed banana can also serve as good substitutions for butter or oil, usually in a 1:1 ratio. 15. Top casseroles with high-fiber cereal instead of breadcrumbs. Breadcrumbs are typically made with white bread, while breakfast cereals contain 5-9g of fiber per serving. Not only will you save more than 150 calories per  cup serving, the swap will also keep you more full and you'll get a metabolism boost from the added fiber. 16. Snack on pistachios instead of macadamia nuts. Believe it or not, you get the same amount of calories from 35 pistachios (100 calories) as you would from only five macadamia nuts. 17. Chow down on kale chips rather than potato chips. This is my favorite 'don't knock it 'till you try it' swap. Kale chips are so easy to make at home, and you can spice them up with a little grated parmesan or chili powder. Plus, they're a mere fraction of the calories of potato chips, but with the same crunch factor we crave so often. 18. Add seltzer and some fruit slices to your cocktail instead of soda or fruit juice. One cup of soda or fruit juice can pack on as much as 140 calories. Instead, use seltzer and fruit  slices. The fruit provides valuable phytochemicals, such as flavonoids and anthocyanins, which help to combat cancer and stave off the aging process.

## 2016-11-05 NOTE — Progress Notes (Signed)
Impression and Recommendations:    1. Plantar fascial fibromatosis of right foot   2. (BMI >= 40)   3. New Onset- Essential hypertension   4. Mixed hyperlipidemia   5. Salt craving   6. h/o Elevated fasting blood sugar   7. Allergic rhinitis due to pollen, unspecified seasonality   8. h/o Muscle cramps in legs b/l due to mild dehydration     Track all food/ fluid intake on lose it for one month or so--> RTC for eval of diet/ nutritional intake  - depending, we may consider referral to Dr Quillian Quince at [facility] or Dr Cathey Endow at [facility] in [place].  Look over the wt loss meds we discussed below--> will talk about them next OV.     Muscle cramps in legs b/l Cont inc water intake as this is helping a lot with her sx;  Exercise or walk to goal of 162min/ wk  Plantar fascial fibromatosis of right foot Handouts from [facility] given to pt on Plantar fascitis after d/c pt re:  Counseling done regarding disease process and various treatment options.  Ice, stretching, NSAIDs, and MUST WEAR SUPPORTIVE FOOTWEAR- at all times.     All questions answered.   - pt declined Podiatry referral for custom orthotics  - Follow-up sooner than planned if symptoms do not continue to improve or you have additional concerns/questions.      (BMI >= 40) Track all food/ fluid intake on lose it for one month or so--> RTC for eval of diet/ nutritional intake  - depending, we may consider referral to Dr Quillian Quince at [facility] or Dr Cathey Endow at [facility] in [place] for wt loss mgt.  Declines today.  We discussed wt loss med in detail today and handouts given.  Look over the wt loss med handouts we discussed below--> will talk about them next OV.       HLD (hyperlipidemia) Reck yrly-->   Last checked Sept '17. WNL's  Elevated fasting blood sugar A1c 5.6 in Sept '17  Stable  Wt loss  Exercise   New Onset- Essential hypertension Cont meds  Monitor at home- keep log, cont lifestyle  mod  Salt craving Improved sx with inc water intake per pt- told to cont lifestyle mod    Education and routine counseling performed. Handouts provided.   New Prescriptions   No medications on file    Modified Medications   No medications on file   Pt was in the office today for 40+ minutes, with over 50% time spent in face to face counseling of patients various medical conditions including weight mgt, foot pain, nutritional counseling and treatment plans of those medical conditions including medicine management( contrave, qsymia, phenteramine d/c pt)  and lifestyle modification ( lose it to track all food intake), strategies to improve health and well being; and in coordination of care.   Discontinued Medications   CLOBETASOL CREAM (TEMOVATE) 0.05 %    Apply 1 application topically 2 (two) times daily as needed.    Return in about 4 weeks (around 12/03/2016) for weight loss follow up.   The patient was counseled, risk factors were discussed, anticipatory guidance given.  Gross side effects, risk and benefits, and alternatives of medications discussed with patient.  Patient is aware that all medications have potential side effects and we are unable to predict every side effect or drug-drug interaction that may occur.  Expresses verbal understanding and consents to current therapy plan and  treatment regimen.  Please see AVS handed out to patient at the end of our visit for further patient instructions/ counseling done pertaining to today's office visit.    Note: This document was prepared using Dragon voice recognition software and may include unintentional dictation errors.     Subjective:    Chief Complaint  Patient presents with  . Weight Loss  . Hypertension  . Foot Pain    HPI: Tiffany Glover is a 56 y.o. female who presents to [facility] today for follow up for HTN.     HTN:    On chlorthalidone since Nov  '17.  Home BP readings have  been very well controlled and running around 119 on top over 70-82 on btm.    Pt has been tolerating meds well.  Taking as prescribed.   Denies HA, dizziness, CP, SOB, Visual changes, increasing pedal edema ( but it is stable- no more than usua;).   Exercise: no;  Diet Pattern: poor;    Salt cravings:  Now doing salt Restriction: actually good--> no longer craving it - She has increased her fluids--> muscle cramps in legs she has occ have gone away- she is very pleased.  - Salt urge has subsided.   Allergic rhinitis:  Well controlled currently  New c/o R Foot Pain:   Came on after she started a walking program c wks ago, a lot pain when she first gets up on it in the am and after prolonged rest.  Wearing very poorly supportive sandals today in office. Never had in past  Wt loss:   Pt frustrated and discouraged.  Joined [employer] c wks ago- but not working for her b/c she can eat poorly - stay within her points and not lose any wt.   Cut out grains, white flour, breads, pasta.    She eats lots veggies >er fruits.   02/13/16- was 284--> lost 20lbs since- she is proud of slow but surely having wt loss.      Patient Care Team    Relationship Specialty Notifications Start End  Thomasene Lot, DO PCP - General Family Medicine  02/12/16      Wt Readings from Last 3 Encounters:  11/05/16 264 lb 3.2 oz (119.8 kg)  07/11/16 269 lb 4.8 oz (122.2 kg)  06/11/16 268 lb (121.6 kg)    BP Readings from Last 3 Encounters:  11/05/16 119/84  07/11/16 119/76  06/11/16 116/75    Pulse Readings from Last 3 Encounters:  11/05/16 86  07/11/16 84  06/11/16 84    BMI Readings from Last 3 Encounters:  11/05/16 45.35 kg/m  07/11/16 46.23 kg/m  06/11/16 46.00 kg/m     Lab Results  Component Value Date   CREATININE 0.62 06/11/2016   BUN 17 06/11/2016   NA 139 06/11/2016   K 4.1 06/11/2016   CL 101 06/11/2016   CO2 28 06/11/2016    Lab Results  Component Value Date   CHOL 191 03/11/2016     Lab Results  Component Value Date   HDL 50 03/11/2016    Lab Results  Component Value Date   LDLCALC 119 03/11/2016    Lab Results  Component Value Date   TRIG 110 03/11/2016    Lab Results  Component Value Date   CHOLHDL 3.8 03/11/2016    No results found for: LDLDIRECT ===================================================================  Patient Active Problem List   Diagnosis Date Noted  . Salt craving 06/11/2016  Priority: High  . New Onset- Essential hypertension 06/01/2016    Priority: High  . History of smoking 30 pack years- Quit 9/ 1/ 96 02/25/2016    Priority: High  . HLD (hyperlipidemia) 05/17/2014    Priority: High  . Elevated fasting blood sugar 05/17/2014    Priority: High  . (BMI >= 40) 09/14/2013    Priority: High  . Plantar fascial fibromatosis of right foot 11/10/2016    Priority: Medium  . Personal history of noncompliance with medical treatment and regimen 02/25/2016    Priority: Medium  . Adiposity 05/17/2014    Priority: Medium  . Hypothyroidism 09/04/2007    Priority: Medium  . Muscle cramps in legs b/l 11/10/2016    Priority: Low  . Bilateral impacted cerumen 06/01/2016    Priority: Low  . Seborrheic dermatitis- ears, scalp 06/01/2016    Priority: Low  . Counseling on health promotion and disease prevention 06/01/2016    Priority: Low  . Vitamin D insufficiency 03/26/2016    Priority: Low  . History of laparoscopic cholecystectomy 02/25/2016    Priority: Low  . Family history of suicide- father age 43 02/25/2016    Priority: Low  . Family history of alcoholism in father; grandfathers etc 02/25/2016    Priority: Low  . 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 Allergic rhinitis/ seasonal allergies 05/17/2014    Priority: Low  . h/o Hemorrhoids, internal, with bleeding 09/04/2007    Priority: Low  . h/o Hemorrhoids, external 09/04/2007    Priority: Low  . History of diverticulosis 09/04/2007     Priority: Low  . Person with feared health complaint in whom no diagnosis is made 06/01/2016  . Elevated blood pressure reading without diagnosis of hypertension 02/25/2016  . Incisional hernia 09/15/2014  . Incisional hernia, without obstruction or gangrene 08/08/2014  . DUB (dysfunctional uterine bleeding) 05/17/2014    Past Medical History:  Diagnosis Date  . Complication of anesthesia    hard to wake up once  . Hypothyroidism   . Mild stress incontinence   . Morbid obesity with BMI of 40.0-44.9, adult (HCC)   . Obesity (BMI 30-39.9) 07/25/2012  . Ventral incisional hernia     Past Surgical History:  Procedure Laterality Date  . CESAREAN SECTION    . CESAREAN SECTION    . CHOLECYSTECTOMY  07/21/2012   Procedure: LAPAROSCOPIC CHOLECYSTECTOMY WITH INTRAOPERATIVE CHOLANGIOGRAM;  Surgeon: Emelia Loron, MD;  Location: [facility];  Service: General;  Laterality: N/A;  attempted  . CHOLECYSTECTOMY  07/21/2012   Procedure: CHOLECYSTECTOMY;  Surgeon: Emelia Loron, MD;  Location: [facility];  Service: General;  Laterality: N/A;  . FOOT SURGERY     ganglion cyst removed from right foot  . HERNIA REPAIR  09/2014  . INCISIONAL HERNIA REPAIR N/A 09/15/2014   Procedure: LAPAROSCOPIC INCISIONAL HERNIA REPAIR WITH MESH;  Surgeon: Emelia Loron, MD;  Location: [facility];  Service: General;  Laterality: N/A;  . vein strippiing     left leg    Family History  Problem Relation Age of Onset  . Cancer Mother        Lung  . Suicidality Father   . Alcohol abuse Father   . Cancer Brother        mouth  . Cancer Maternal Grandmother   . Alcohol abuse Maternal Grandfather   . Urolithiasis Son   . Hyperlipidemia Paternal Grandmother   . Hypertension Paternal Grandmother   . Alcohol abuse Paternal Grandfather  History  Drug Use No  ,  History  Alcohol Use  . Yes    Comment: Occassional Use  ,  History  Smoking Status  . Former Smoker  . Packs/day: 1.50  . Years: 20.00  . Quit  date: 03/02/1995  Smokeless Tobacco  . Never Used  ,    Current Outpatient Prescriptions on File Prior to Visit  Medication Sig Dispense Refill  . chlorthalidone (HYGROTON) 25 MG tablet TAKE ONE-HALF TABLET BY MOUTH ONCE DAILY 45 tablet 0  . Cholecalciferol (VITAMIN D3) 5000 units TABS 5,000 IU OTC vitamin D3 daily. 90 tablet 3  . cyclobenzaprine (FLEXERIL) 10 MG tablet TAKE 1 TABLET (10 MG TOTAL) BY MOUTH 3 (THREE) TIMES DAILY AS NEEDED FOR MUSCLE SPASMS. 30 tablet 0  . Multiple Vitamin (MULTIVITAMIN) tablet Take 1 tablet by mouth daily.    . naproxen (NAPROSYN) 500 MG tablet TWICE DAILY WITH MEALS AS NEEDED ONLY FOR PAIN 60 tablet 0  . triamcinolone cream (KENALOG) 0.5 % Apply 1 application topically 2 (two) times daily. To affected areas. (Patient taking differently: Apply 1 application topically 2 (two) times daily. To affected areas as needed) 60 g 1   No current facility-administered medications on file prior to visit.     No Known Allergies   Review of Systems  Constitutional: Negative for chills and fever.  HENT: Negative for congestion and sinus pain.   Eyes: Negative for blurred vision and double vision.  Respiratory: Negative for shortness of breath and wheezing.   Cardiovascular: Negative for chest pain, palpitations and orthopnea.  Gastrointestinal: Negative for constipation, diarrhea, heartburn, nausea and vomiting.  Genitourinary: Negative for frequency and hematuria.  Musculoskeletal: Positive for joint pain. Negative for falls.       Foot  Skin: Negative for rash.  Neurological: Negative for dizziness, sensory change and focal weakness.  Endo/Heme/Allergies: Negative for polydipsia.  Psychiatric/Behavioral: Negative for depression and suicidal ideas. The patient is not nervous/anxious and does not have insomnia.      Objective:   Blood pressure 119/84, pulse 86, height 5\' 4"  (1.626 m), weight 264 lb 3.2 oz (119.8 kg), last menstrual period 09/08/2014. General:  Well Developed, well nourished, and in no acute distress.  HEENT: Normocephalic, atraumatic, pupils equal round reactive to light, neck supple, No carotid bruits, no JVD Skin: Warm and dry, cap RF less 2 sec Cardiac: Regular rate and rhythm, S1, S2 WNL's, no murmurs rubs or gallops Respiratory: ECTA B/L, Not using accessory muscles, speaking in full sentences. Ext: 1+ edema b/l lower ext Psych: No HI/SI, judgement and insight good, Euthymic mood. Full Affect. FEET:  R Echymosis: no Edema: no ROM: full LE B Gait: heel toe, non-antalgic Lateral Mall: NT Medial Mall: NT Calcaneous: NT Metatarsals: NT 5th MT: NT Phalanges: NT Achilles: NT Plantar Fascia: tender to palp; pain with forced dorsiflexion Post Tib: NT Great Toe: Nml motion Ant Drawer: neg No ligamentous laxity Other foot breakdown: none Sensation: intact

## 2016-11-08 ENCOUNTER — Ambulatory Visit: Payer: BC Managed Care – PPO | Admitting: Family Medicine

## 2016-11-10 DIAGNOSIS — R252 Cramp and spasm: Secondary | ICD-10-CM | POA: Insufficient documentation

## 2016-11-10 DIAGNOSIS — M722 Plantar fascial fibromatosis: Secondary | ICD-10-CM | POA: Insufficient documentation

## 2016-11-10 NOTE — Assessment & Plan Note (Signed)
A1c 5.6 in Sept '17  Stable  Wt loss  Exercise

## 2016-11-10 NOTE — Assessment & Plan Note (Addendum)
Cont inc water intake as this is helping a lot with her sx;  Exercise or walk to goal of 15050min/ wk

## 2016-11-10 NOTE — Assessment & Plan Note (Signed)
Handouts from [facility] Med Advisor given to pt on Plantar fascitis after d/c pt re:  Counseling done regarding disease process and various treatment options.  Ice, stretching, NSAIDs, and MUST WEAR SUPPORTIVE FOOTWEAR- at all times.     All questions answered.   - pt declined Podiatry referral for custom orthotics  - Follow-up sooner than planned if symptoms do not continue to improve or you have additional concerns/questions.

## 2016-11-10 NOTE — Assessment & Plan Note (Signed)
Cont meds  Monitor at home- keep log, cont lifestyle mod

## 2016-11-10 NOTE — Assessment & Plan Note (Signed)
Improved sx with inc water intake per pt- told to cont lifestyle mod

## 2016-11-10 NOTE — Assessment & Plan Note (Signed)
Reck yrly-->   Last checked Sept '17. WNL's

## 2016-11-10 NOTE — Assessment & Plan Note (Signed)
Track all food/ fluid intake on lose it for one month or so--> RTC for eval of diet/ nutritional intake  - depending, we may [facility] or Dr Cathey EndowBowen at [facility] in [place] for wt loss mgt.  Declines today.  We discussed wt loss med in detail today and handouts given.  Look over the wt loss med handouts we discussed below--> will talk about them next OV.

## 2016-11-19 ENCOUNTER — Encounter: Payer: Self-pay | Admitting: Physician Assistant

## 2016-11-19 ENCOUNTER — Ambulatory Visit (INDEPENDENT_AMBULATORY_CARE_PROVIDER_SITE_OTHER): Payer: BC Managed Care – PPO | Admitting: Physician Assistant

## 2016-11-19 VITALS — BP 134/78 | HR 83 | Temp 98.5°F | Resp 16 | Ht 64.0 in | Wt 269.6 lb

## 2016-11-19 DIAGNOSIS — H6690 Otitis media, unspecified, unspecified ear: Secondary | ICD-10-CM | POA: Diagnosis not present

## 2016-11-19 MED ORDER — PREDNISONE 20 MG PO TABS: 40.0000 mg | ORAL_TABLET | Freq: Every day | ORAL | 0 refills | Status: DC

## 2016-11-19 MED ORDER — HYDROCORTISONE-ACETIC ACID 1-2 % OT SOLN: 4.0000 [drp] | Freq: Four times a day (QID) | OTIC | 0 refills | Status: DC | PRN

## 2016-11-19 MED ORDER — AMOXICILLIN 500 MG PO CAPS: 500.0000 mg | ORAL_CAPSULE | Freq: Three times a day (TID) | ORAL | 0 refills | Status: AC

## 2016-11-19 NOTE — Patient Instructions (Signed)
Otitis Media, Adult Otitis media is redness, soreness, and puffiness (swelling) in the space just behind your eardrum (middle ear). It may be caused by allergies or infection. It often happens along with a cold. Follow these instructions at home:  Take your medicine as told. Finish it even if you start to feel better.  Only take over-the-counter or prescription medicines for pain, discomfort, or fever as told by your doctor.  Follow up with your doctor as told. Contact a doctor if:  You have otitis media only in one ear, or bleeding from your nose, or both.  You notice a lump on your neck.  You are not getting better in 3-5 days.  You feel worse instead of better. Get help right away if:  You have pain that is not helped with medicine.  You have puffiness, redness, or pain around your ear.  You get a stiff neck.  You cannot move part of your face (paralysis).  You notice that the bone behind your ear hurts when you touch it. This information is not intended to replace advice given to you by your health care provider. Make sure you discuss any questions you have with your health care provider. Document Released: 12/04/2007 Document Revised: 11/23/2015 Document Reviewed: 01/12/2013 Elsevier Interactive Patient Education  2017 [employer]  

## 2016-11-22 NOTE — Progress Notes (Signed)
PRIMARY CARE AT [facility] [address], [place] [address] [phone]  Date:  11/19/2016   Name:  Tiffany Glover   DOB:  11-Jun-1961   MRN:  [id number]  PCP:  Thomasene Lot, DO    History of Present Illness:  Tiffany Glover is a 56 y.o. female patient who presents to PCP with  Chief Complaint  Patient presents with  . Ear Pain    in R ear, per patient, began yesterday. Patient states that ear began itching appx 2 days ago      --pain at right ear that  Is sharp and feels slightly itchy.  No change in hearing, fever, or dizziness. --no congestion, sore throat.  Patient Active Problem List   Diagnosis Date Noted  . Plantar fascial fibromatosis of right foot 11/10/2016  . Muscle cramps in legs b/l 11/10/2016  . Salt craving 06/11/2016  . New Onset- Essential hypertension 06/01/2016  . Bilateral impacted cerumen 06/01/2016  . Person with feared health complaint in whom no diagnosis is made 06/01/2016  . Seborrheic dermatitis- ears, scalp 06/01/2016  . Counseling on health promotion and disease prevention 06/01/2016  . Vitamin D insufficiency 03/26/2016  . History of laparoscopic cholecystectomy 02/25/2016  . History of smoking 30 pack years- Quit 9/ 1/ 96 02/25/2016  . Family history of suicide- father age 68 02/25/2016  . Family history of alcoholism in father; grandfathers etc 02/25/2016  . 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  . Personal history of noncompliance with medical treatment and regimen 02/25/2016  . Elevated blood pressure reading without diagnosis of hypertension 02/25/2016  . Incisional hernia 09/15/2014  . Incisional hernia, without obstruction or gangrene 08/08/2014  . Allergic rhinitis/ seasonal allergies 05/17/2014  . DUB (dysfunctional uterine bleeding) 05/17/2014  . HLD (hyperlipidemia) 05/17/2014  . Elevated fasting blood sugar 05/17/2014  . Adiposity 05/17/2014  . (BMI >= 40) 09/14/2013  . Hypothyroidism 09/04/2007  . h/o Hemorrhoids,  internal, with bleeding 09/04/2007  . h/o Hemorrhoids, external 09/04/2007  . History of diverticulosis 09/04/2007    Past Medical History:  Diagnosis Date  . Complication of anesthesia    hard to wake up once  . Hypothyroidism   . Mild stress incontinence   . Morbid obesity with BMI of 40.0-44.9, adult (HCC)   . Obesity (BMI 30-39.9) 07/25/2012  . Ventral incisional hernia     Past Surgical History:  Procedure Laterality Date  . CESAREAN SECTION    . CESAREAN SECTION    . CHOLECYSTECTOMY  07/21/2012   Procedure: LAPAROSCOPIC CHOLECYSTECTOMY WITH INTRAOPERATIVE CHOLANGIOGRAM;  Surgeon: Emelia Loron, MD;  Location: [facility];  Service: General;  Laterality: N/A;  attempted  . CHOLECYSTECTOMY  07/21/2012   Procedure: CHOLECYSTECTOMY;  Surgeon: Emelia Loron, MD;  Location: [facility];  Service: General;  Laterality: N/A;  . FOOT SURGERY     ganglion cyst removed from right foot  . HERNIA REPAIR  09/2014  . INCISIONAL HERNIA REPAIR N/A 09/15/2014   Procedure: LAPAROSCOPIC INCISIONAL HERNIA REPAIR WITH MESH;  Surgeon: Emelia Loron, MD;  Location: [facility];  Service: General;  Laterality: N/A;  . vein strippiing     left leg    Social History  Substance Use Topics  . Smoking status: Former Smoker    Packs/day: 1.50    Years: 20.00    Quit date: 03/02/1995  . Smokeless tobacco: Never Used  . Alcohol use Yes     Comment: Occassional Use    Family History  Problem Relation Age of Onset  .  Cancer Mother        Lung  . Suicidality Father   . Alcohol abuse Father   . Cancer Brother        mouth  . Cancer Maternal Grandmother   . Alcohol abuse Maternal Grandfather   . Urolithiasis Son   . Hyperlipidemia Paternal Grandmother   . Hypertension Paternal Grandmother   . Alcohol abuse Paternal Grandfather     No Known Allergies  Medication list has been reviewed and updated.  Current Outpatient Prescriptions on File Prior to Visit  Medication Sig Dispense Refill  .  chlorthalidone (HYGROTON) 25 MG tablet TAKE ONE-HALF TABLET BY MOUTH ONCE DAILY 45 tablet 0  . Cholecalciferol (VITAMIN D3) 5000 units TABS 5,000 IU OTC vitamin D3 daily. 90 tablet 3  . cyclobenzaprine (FLEXERIL) 10 MG tablet TAKE 1 TABLET (10 MG TOTAL) BY MOUTH 3 (THREE) TIMES DAILY AS NEEDED FOR MUSCLE SPASMS. 30 tablet 0  . Multiple Vitamin (MULTIVITAMIN) tablet Take 1 tablet by mouth daily.    . naproxen (NAPROSYN) 500 MG tablet TWICE DAILY WITH MEALS AS NEEDED ONLY FOR PAIN 60 tablet 0  . triamcinolone cream (KENALOG) 0.5 % Apply 1 application topically 2 (two) times daily. To affected areas. (Patient taking differently: Apply 1 application topically 2 (two) times daily. To affected areas as needed) 60 g 1   No current facility-administered medications on file prior to visit.     ROS ROS otherwise unremarkable unless listed above.  Physical Examination: BP 134/78 (BP Location: Right Arm, Patient Position: Sitting, Cuff Size: Large)   Pulse 83   Temp 98.5 F (36.9 C) (Oral)   Resp 16   Ht 5\' 4"  (1.626 m)   Wt 269 lb 9.6 oz (122.3 kg)   LMP 09/08/2014 (Within Days)   SpO2 94%   BMI 46.28 kg/m  Ideal Body Weight: Weight in (lb) to have BMI = 25: 145.3  Physical Exam  Constitutional: She is oriented to person, place, and time. She appears well-developed and well-nourished. No distress.  HENT:  Head: Normocephalic and atraumatic.  Right Ear: Tympanic membrane, external ear and ear canal normal.  Left Ear: Tympanic membrane, external ear and ear canal normal.  Nose: Mucosal edema and rhinorrhea present. Right sinus exhibits no maxillary sinus tenderness and no frontal sinus tenderness. Left sinus exhibits no maxillary sinus tenderness and no frontal sinus tenderness.  Mouth/Throat: No uvula swelling. No oropharyngeal exudate, posterior oropharyngeal edema or posterior oropharyngeal erythema.  Canal at the 9 o'clock tender without erythema  Eyes: Conjunctivae and EOM are normal.  Pupils are equal, round, and reactive to light.  Cardiovascular: Normal rate and regular rhythm.  Exam reveals no gallop, no distant heart sounds and no friction rub.   No murmur heard. Pulmonary/Chest: Effort normal. No respiratory distress. She has no decreased breath sounds. She has no wheezes. She has no rhonchi.  Lymphadenopathy:       Head (right side): No submandibular, no tonsillar, no preauricular and no posterior auricular adenopathy present.       Head (left side): No submandibular, no tonsillar, no preauricular and no posterior auricular adenopathy present.  Neurological: She is alert and oriented to person, place, and time.  Skin: She is not diaphoretic.  Psychiatric: She has a normal mood and affect. Her behavior is normal.     Assessment and Plan: Eilene GhaziRhonda C Beaston is a 56 y.o. female who is here today for right ear pain  1. Acute otitis media, unspecified otitis media type -  acetic acid-hydrocortisone (VOSOL-HC) otic solution; Place 4 drops into the right ear every 6 (six) hours as needed.  Dispense: 10 mL; Refill: 0 - amoxicillin (AMOXIL) 500 MG capsule; Take 1 capsule (500 mg total) by mouth 3 (three) times daily.  Dispense: 20 capsule; Refill: 0 - predniSONE (DELTASONE) 20 MG tablet; Take 2 tablets (40 mg total) by mouth daily with breakfast.  Dispense: 6 tablet; Refill: 0   Trena Platt, PA-C [facility] 11/22/2016 3:07 PM

## 2016-12-09 ENCOUNTER — Ambulatory Visit: Payer: BC Managed Care – PPO | Admitting: Family Medicine

## 2016-12-17 ENCOUNTER — Encounter: Payer: Self-pay | Admitting: Gastroenterology

## 2017-01-08 ENCOUNTER — Encounter: Payer: Self-pay | Admitting: Gastroenterology

## 2017-01-16 ENCOUNTER — Other Ambulatory Visit: Payer: Self-pay | Admitting: Adult Health

## 2017-01-21 ENCOUNTER — Other Ambulatory Visit: Payer: Self-pay

## 2017-01-21 DIAGNOSIS — R252 Cramp and spasm: Secondary | ICD-10-CM

## 2017-01-21 MED ORDER — CYCLOBENZAPRINE HCL 10 MG PO TABS: 10.0000 mg | ORAL_TABLET | Freq: Three times a day (TID) | ORAL | 0 refills | Status: DC | PRN

## 2017-03-07 ENCOUNTER — Ambulatory Visit (AMBULATORY_SURGERY_CENTER): Payer: Self-pay | Admitting: *Deleted

## 2017-03-07 VITALS — Ht 64.0 in | Wt 268.0 lb

## 2017-03-07 DIAGNOSIS — Z1211 Encounter for screening for malignant neoplasm of colon: Secondary | ICD-10-CM

## 2017-03-07 MED ORDER — NA SULFATE-K SULFATE-MG SULF 17.5-3.13-1.6 GM/177ML PO SOLN: 1.0000 | Freq: Once | ORAL | 0 refills | Status: AC

## 2017-03-07 NOTE — Progress Notes (Signed)
No egg or soy allergy known to patient  No issues with past sedation with any surgeries  or procedures, no intubation problems - hard to wake post op  No diet pills per patient No home 02 use per patient  No blood thinners per patient  Pt denies issues with constipation  No A fib or A flutter  EMMI video sent to pt's e mail

## 2017-03-13 ENCOUNTER — Encounter: Payer: Self-pay | Admitting: Gastroenterology

## 2017-03-21 ENCOUNTER — Ambulatory Visit (AMBULATORY_SURGERY_CENTER): Payer: BC Managed Care – PPO | Admitting: Gastroenterology

## 2017-03-21 ENCOUNTER — Encounter: Payer: Self-pay | Admitting: Gastroenterology

## 2017-03-21 VITALS — BP 110/58 | HR 63 | Temp 97.1°F | Resp 13 | Ht 64.0 in | Wt 268.0 lb

## 2017-03-21 DIAGNOSIS — Z1211 Encounter for screening for malignant neoplasm of colon: Secondary | ICD-10-CM | POA: Diagnosis present

## 2017-03-21 DIAGNOSIS — K5730 Diverticulosis of large intestine without perforation or abscess without bleeding: Secondary | ICD-10-CM

## 2017-03-21 DIAGNOSIS — Z1212 Encounter for screening for malignant neoplasm of rectum: Secondary | ICD-10-CM

## 2017-03-21 MED ORDER — SODIUM CHLORIDE 0.9 % IV SOLN: 500.0000 mL | INTRAVENOUS | Status: DC

## 2017-03-21 MED ORDER — CHOLESTYRAMINE 4 G PO PACK: 4.0000 g | PACK | Freq: Every day | ORAL | 11 refills | Status: DC

## 2017-03-21 NOTE — Op Note (Signed)
[facility] Patient Name: Tiffany Glover Procedure Date: 03/21/2017 10:15 AM MRN: [id number] Endoscopist: Rachael Fee , MD Age: 56 Referring MD:  Date of Birth: 1960-09-27 Gender: Female Account #: [id number] Procedure:                Colonoscopy Indications:              Screening for colorectal malignant neoplasm Medicines:                Monitored Anesthesia Care Procedure:                Pre-Anesthesia Assessment:                           - Prior to the procedure, a History and Physical                            was performed, and patient medications and                            allergies were reviewed. The patient's tolerance of                            previous anesthesia was also reviewed. The risks                            and benefits of the procedure and the sedation                            options and risks were discussed with the patient.                            All questions were answered, and informed consent                            was obtained. Prior Anticoagulants: The patient has                            taken no previous anticoagulant or antiplatelet                            agents. ASA Grade Assessment: III - A patient with                            severe systemic disease. After reviewing the risks                            and benefits, the patient was deemed in                            satisfactory condition to undergo the procedure.                           After obtaining informed consent, the colonoscope  was passed under direct vision. Throughout the                            procedure, the patient's blood pressure, pulse, and                            oxygen saturations were monitored continuously. The                            Colonoscope was introduced through the anus and                            advanced to the the cecum, identified by                            appendiceal orifice  and ileocecal valve. The                            colonoscopy was performed without difficulty. The                            patient tolerated the procedure well. The quality                            of the bowel preparation was good. The ileocecal                            valve, appendiceal orifice, and rectum were                            photographed. Scope In: 11:07:46 AM Scope Out: 11:21:33 AM Scope Withdrawal Time: 0 hours 5 minutes 19 seconds  Total Procedure Duration: 0 hours 13 minutes 47 seconds  Findings:                 Multiple small and large-mouthed diverticula were                            found in the left colon.                           The exam was otherwise without abnormality on                            direct and retroflexion views. Complications:            No immediate complications. Estimated blood loss:                            None. Estimated Blood Loss:     Estimated blood loss: none. Impression:               - Diverticulosis in the left colon.                           - The examination was otherwise normal on direct  and retroflexion views.                           - No specimens collected. Recommendation:           - Patient has a contact number available for                            emergencies. The signs and symptoms of potential                            delayed complications were discussed with the                            patient. Return to normal activities tomorrow.                            Written discharge instructions were provided to the                            patient.                           - Resume previous diet.                           - Continue present medications.                           - Repeat colonoscopy in 10 years for screening                            purposes. Rachael Fee, MD 03/21/2017 11:24:40 AM This report has been signed electronically.

## 2017-03-21 NOTE — Progress Notes (Signed)
Pt's states no medical or surgical changes since previsit or office visit. 

## 2017-03-21 NOTE — Progress Notes (Signed)
To recovery, report to RN, VSS. 

## 2017-03-21 NOTE — Patient Instructions (Signed)
YOU HAD AN ENDOSCOPIC PROCEDURE TODAY AT THE [facility]:   Refer to the procedure report that was given to you for any specific questions about what was found during the examination.  If the procedure report does not answer your questions, please call your gastroenterologist to clarify.  If you requested that your care partner not be given the details of your procedure findings, then the procedure report has been included in a sealed envelope for you to review at your convenience later.  YOU SHOULD EXPECT: Some feelings of bloating in the abdomen. Passage of more gas than usual.  Walking can help get rid of the air that was put into your GI tract during the procedure and reduce the bloating. If you had a lower endoscopy (such as a colonoscopy or flexible sigmoidoscopy) you may notice spotting of blood in your stool or on the toilet paper. If you underwent a bowel prep for your procedure, you may not have a normal bowel movement for a few days.  Please Note:  You might notice some irritation and congestion in your nose or some drainage.  This is from the oxygen used during your procedure.  There is no need for concern and it should clear up in a day or so.  SYMPTOMS TO REPORT IMMEDIATELY:   Following lower endoscopy (colonoscopy or flexible sigmoidoscopy):  Excessive amounts of blood in the stool  Significant tenderness or worsening of abdominal pains  Swelling of the abdomen that is new, acute  Fever of 100F or higher  For urgent or emergent issues, a gastroenterologist can be reached at any hour by calling [phone].   DIET:  We do recommend a small meal at first, but then you may proceed to your regular diet.  Drink plenty of fluids but you should avoid alcoholic beverages for 24 hours.  ACTIVITY:  You should plan to take it easy for the rest of today and you should NOT DRIVE or use heavy machinery until tomorrow (because of the sedation medicines used during the test).     FOLLOW UP: Our staff will call the number listed on your records the next business day following your procedure to check on you and address any questions or concerns that you may have regarding the information given to you following your procedure. If we do not reach you, we will leave a message.  However, if you are feeling well and you are not experiencing any problems, there is no need to return our call.  We will assume that you have returned to your regular daily activities without incident.  SIGNATURES/CONFIDENTIALITY: You and/or your care partner have signed paperwork which will be entered into your electronic medical record.  These signatures attest to the fact that that the information above on your After Visit Summary has been reviewed and is understood.  Full responsibility of the confidentiality of this discharge information lies with you and/or your care-partner.  Please read over handouts about diverticulosis and high fiber diets  Continue your normal medications  Next colonoscopy-10 years  Your Questran refills were sent to your [facility] pharmacy

## 2017-03-24 ENCOUNTER — Telehealth: Payer: Self-pay

## 2017-03-24 NOTE — Telephone Encounter (Signed)
Left message

## 2017-03-24 NOTE — Telephone Encounter (Signed)
  Follow up Call-  Call Yesica Kemler number 03/21/2017  Post procedure Call Jefferey Lippmann phone  # [phone]  Permission to leave phone message Yes  Some recent data might be hidden     Patient questions:  Do you have a fever, pain , or abdominal swelling? No. Pain Score  0 *  Have you tolerated food without any problems? Yes.    Have you been able to return to your normal activities? Yes.    Do you have any questions about your discharge instructions: Diet   No. Medications  No. Follow up visit  No.  Do you have questions or concerns about your Care? No.  Actions: * If pain score is 4 or above: No action needed, pain <4.

## 2017-06-13 LAB — HM MAMMOGRAPHY

## 2017-06-27 ENCOUNTER — Encounter: Payer: Self-pay | Admitting: Family Medicine

## 2017-08-27 ENCOUNTER — Ambulatory Visit (INDEPENDENT_AMBULATORY_CARE_PROVIDER_SITE_OTHER): Payer: BC Managed Care – PPO | Admitting: Physician Assistant

## 2017-08-27 ENCOUNTER — Encounter: Payer: Self-pay | Admitting: Physician Assistant

## 2017-08-27 VITALS — BP 130/80 | HR 98 | Temp 98.3°F | Resp 17 | Ht 64.5 in | Wt 238.0 lb

## 2017-08-27 DIAGNOSIS — R109 Unspecified abdominal pain: Secondary | ICD-10-CM

## 2017-08-27 LAB — POCT CBC
GRANULOCYTE PERCENT: 73 % (ref 37–80)
HCT, POC: 44.9 % (ref 37.7–47.9)
HEMOGLOBIN: 15 g/dL (ref 12.2–16.2)
Lymph, poc: 1.8 (ref 0.6–3.4)
MCH: 29.3 pg (ref 27–31.2)
MCHC: 33.4 g/dL (ref 31.8–35.4)
MCV: 87.7 fL (ref 80–97)
MID (CBC): 0.3 (ref 0–0.9)
MPV: 9 fL (ref 0–99.8)
PLATELET COUNT, POC: 180 10*3/uL (ref 142–424)
POC Granulocyte: 5.8 (ref 2–6.9)
POC LYMPH PERCENT: 22.8 %L (ref 10–50)
POC MID %: 4.2 % (ref 0–12)
RBC: 5.12 M/uL (ref 4.04–5.48)
RDW, POC: 13.5 %
WBC: 7.9 10*3/uL (ref 4.6–10.2)

## 2017-08-27 MED ORDER — AMOXICILLIN-POT CLAVULANATE 875-125 MG PO TABS: 1.0000 | ORAL_TABLET | Freq: Two times a day (BID) | ORAL | 0 refills | Status: DC

## 2017-08-27 NOTE — Progress Notes (Signed)
08/29/2017 8:43 AM   DOB: 1961/04/01 / MRN: [id number]  SUBJECTIVE:  Tiffany Glover is a 57 y.o. female presenting for left lower abdominal quadrant pain started 3 days ago and is worsening.  She does have a history of diverticulosis.  This is confirmed on colonoscopy which exist in [facility].  She denies nausea, fever, diaphoresis.  She denies any new foods.  She states the pain is worsening.  She has a history of abdominal hernia repair with mesh.  She is moving her bowels and tells me these are loose.  She denies any blood in the stool.  She has No Known Allergies.   She  has a past medical history of Allergy, Complication of anesthesia, Hypertension, Hypothyroidism, Mild stress incontinence, Morbid obesity with BMI of 40.0-44.9, adult (HCC), Obesity (BMI 30-39.9) (07/25/2012), and Ventral incisional hernia.    She  reports that she quit smoking about 22 years ago. She has a 30.00 pack-year smoking history. she has never used smokeless tobacco. She reports that she drinks alcohol. She reports that she does not use drugs. She  has no sexual activity history on file. The patient  has a past surgical history that includes Cesarean section; Cesarean section; Foot surgery; vein strippiing; Cholecystectomy (07/21/2012); Cholecystectomy (07/21/2012); Incisional hernia repair (N/A, 09/15/2014); Hernia repair (09/2014); and Colonoscopy (02/17/2007).  Her family history includes Alcohol abuse in her father, maternal grandfather, and paternal grandfather; Breast cancer in her maternal grandmother; Cancer in her brother, maternal grandmother, and mother; Hyperlipidemia in her paternal grandmother; Hypertension in her paternal grandmother; Suicidality in her father; Urolithiasis in her son.  Review of Systems  Constitutional: Negative for chills, diaphoresis and fever.  Respiratory: Negative for cough, hemoptysis, sputum production, shortness of breath and wheezing.   Cardiovascular: Negative for chest pain,  orthopnea and leg swelling.  Gastrointestinal: Positive for abdominal pain and diarrhea. Negative for blood in stool, constipation, heartburn, melena, nausea and vomiting.  Skin: Negative for rash.  Neurological: Negative for dizziness.    The problem list and medications were reviewed and updated by myself where necessary and exist elsewhere in the encounter.   OBJECTIVE:  BP 130/80   Pulse 98   Temp 98.3 F (36.8 C) (Oral)   Resp 17   Ht 5' 4.5" (1.638 m)   Wt 238 lb (108 kg)   LMP 09/08/2014 (Within Days)   SpO2 98%   BMI 40.22 kg/m   Pulse Readings from Last 3 Encounters:  08/27/17 98  03/21/17 63  11/19/16 83     Physical Exam  Constitutional: She is active.  Non-toxic appearance.  Cardiovascular: Normal rate.  Pulmonary/Chest: Effort normal. No tachypnea.  Abdominal: Soft. Bowel sounds are normal. She exhibits no distension and no mass. There is tenderness. There is no rebound and no guarding.  TTP in both the right and left lower quadrant.  Neurological: She is alert.  Skin: Skin is warm and dry. She is not diaphoretic. No pallor.    Results for orders placed or performed in visit on 08/27/17 (from the past 72 hour(s))  POCT CBC     Status: None   Collection Time: 08/27/17  3:52 PM  Result Value Ref Range   WBC 7.9 4.6 - 10.2 K/uL   Lymph, poc 1.8 0.6 - 3.4   POC LYMPH PERCENT 22.8 10 - 50 %L   MID (cbc) 0.3 0 - 0.9   POC MID % 4.2 0 - 12 %M   POC Granulocyte 5.8 2 - 6.9  Granulocyte percent 73.0 37 - 80 %G   RBC 5.12 4.04 - 5.48 M/uL   Hemoglobin 15.0 12.2 - 16.2 g/dL   HCT, POC 16.1 09.6 - 47.9 %   MCV 87.7 80 - 97 fL   MCH, POC 29.3 27 - 31.2 pg   MCHC 33.4 31.8 - 35.4 g/dL   RDW, POC 04.5 %   Platelet Count, POC 180 142 - 424 K/uL   MPV 9.0 0 - 99.8 fL    No results found.  ASSESSMENT AND PLAN:  Cookie was seen today for abdominal pain.  Diagnoses and all orders for this visit:  Abdominal pain, unspecified abdominal location: Patient  with a history of left-sided diverticulosis confirmed on colonoscopy.  She is well-appearing today and her CBC is very reassuring.  I am starting her on Augmentin and will see her back in 2 days.  I discussed with the patient that I would like to get a CT of her abdomen given that p.o. antibiotics will not prevent hospitalization and surgery if there is a perforation.  The patient tells me that she would prefer to follow-up closely.  She does agree to go to the ED if she has any acute worsening of her symptoms. -     POCT CBC -     amoxicillin-clavulanate (AUGMENTIN) 875-125 MG tablet; Take 1 tablet by mouth 2 (two) times daily. -     CMP and Liver    The patient is advised to call or return to clinic if she does not see an improvement in symptoms, or to seek the care of the closest emergency department if she worsens with the above plan.   Deliah Boston, MHS, PA-C Primary Care at [facility] 08/29/2017 8:43 AM

## 2017-08-27 NOTE — Patient Instructions (Addendum)
  If you get worse with treatment then go to the ED.     IF you received an x-ray today, you will receive an invoice from [facility]. Please contact [facility] at [phone] with questions or concerns regarding your invoice.   IF you received labwork today, you will receive an invoice from [facility]. Please contact [facility] at [phone] with questions or concerns regarding your invoice.   Our billing staff will not be able to assist you with questions regarding bills from these companies.  You will be contacted with the lab results as soon as they are available. The fastest way to get your results is to activate your My Chart account. Instructions are located on the last page of this paperwork. If you have not heard from us regarding the results in 2 weeks, please contact this office.

## 2017-08-29 ENCOUNTER — Ambulatory Visit: Payer: BC Managed Care – PPO | Admitting: Physician Assistant

## 2017-08-31 ENCOUNTER — Other Ambulatory Visit: Payer: Self-pay | Admitting: Adult Health

## 2017-09-01 ENCOUNTER — Telehealth: Payer: Self-pay | Admitting: Family Medicine

## 2017-09-01 NOTE — Telephone Encounter (Signed)
Provider required OV--[id number]

## 2017-09-02 ENCOUNTER — Telehealth: Payer: Self-pay | Admitting: Family Medicine

## 2017-09-02 NOTE — Telephone Encounter (Signed)
error 

## 2017-09-26 ENCOUNTER — Encounter: Payer: Self-pay | Admitting: Family Medicine

## 2017-09-26 ENCOUNTER — Ambulatory Visit: Payer: BC Managed Care – PPO | Admitting: Family Medicine

## 2017-09-26 VITALS — BP 121/64 | HR 70 | Ht 65.0 in | Wt 238.1 lb

## 2017-09-26 DIAGNOSIS — E669 Obesity, unspecified: Secondary | ICD-10-CM

## 2017-09-26 DIAGNOSIS — R252 Cramp and spasm: Secondary | ICD-10-CM

## 2017-09-26 DIAGNOSIS — E559 Vitamin D deficiency, unspecified: Secondary | ICD-10-CM

## 2017-09-26 DIAGNOSIS — M791 Myalgia, unspecified site: Secondary | ICD-10-CM | POA: Insufficient documentation

## 2017-09-26 DIAGNOSIS — E782 Mixed hyperlipidemia: Secondary | ICD-10-CM | POA: Diagnosis not present

## 2017-09-26 DIAGNOSIS — M546 Pain in thoracic spine: Secondary | ICD-10-CM

## 2017-09-26 DIAGNOSIS — I10 Essential (primary) hypertension: Secondary | ICD-10-CM | POA: Diagnosis not present

## 2017-09-26 DIAGNOSIS — Z8639 Personal history of other endocrine, nutritional and metabolic disease: Secondary | ICD-10-CM | POA: Diagnosis not present

## 2017-09-26 DIAGNOSIS — G8929 Other chronic pain: Secondary | ICD-10-CM

## 2017-09-26 DIAGNOSIS — R638 Other symptoms and signs concerning food and fluid intake: Secondary | ICD-10-CM | POA: Diagnosis not present

## 2017-09-26 DIAGNOSIS — R7301 Impaired fasting glucose: Secondary | ICD-10-CM

## 2017-09-26 MED ORDER — CHLORTHALIDONE 25 MG PO TABS: ORAL_TABLET | ORAL | 1 refills | Status: DC

## 2017-09-26 MED ORDER — NAPROXEN 500 MG PO TABS: 500.0000 mg | ORAL_TABLET | Freq: Two times a day (BID) | ORAL | 0 refills | Status: DC

## 2017-09-26 MED ORDER — CYCLOBENZAPRINE HCL 10 MG PO TABS: 10.0000 mg | ORAL_TABLET | Freq: Three times a day (TID) | ORAL | 0 refills | Status: DC | PRN

## 2017-09-26 NOTE — Progress Notes (Signed)
Impression and Recommendations:    1. New Onset- Essential hypertension   2. Class 2 obesity in adult, unspecified BMI, unspecified obesity type, unspecified whether serious comorbidity present   3. Salt craving   4. Mixed hyperlipidemia   5. Elevated fasting blood sugar   6. Vitamin D insufficiency   7. Muscle cramps in legs b/l   8. Chronic bilateral thoracic back pain   9. Myalgia   10. History of hypothyroidism     1. New onset-essential HTN: -BP is well controlled at home. Pt reports her BP has been steadily decreasing to being at goal over time. She will continue to monitor and may do a trial of going off her meds if her BP is consistently at goal of <130/80.  -refill meds but pt will monitor at home and seek further advice from [place] as needed. 2. Class 2 obesity in adult- Pt has lost about 50 lbs since 01-2016. She has lowered her BMI from 48.75 when I initially saw her, to 39.6 today. Keep up the diet and exercise!!! -Discussed what BMI means medically and how this correlates to risk of disease. -continue to lose weight. 3. Salt craving- -check labs 4. Mixed HLD- -check labs 5. Elevated fasting blood sugar -check labs 6. Vitamin D insufficiency- -check labs 7. Muscle cramps in legs 8. Chronic bilateral thoracic back pain 9. Myalgia -use percussion vibrator to apply on your muscles. -drink adequate amounts of water per day, equal to half of your weight in oz.  -continue taking naproxen and flexeril PRN.  -refill meds 10. H/o hypothyroidism -check labs.    Education and routine counseling performed. Handouts provided.  Orders Placed This Encounter  Procedures  . CBC with Differential/Platelet  . Comprehensive metabolic panel  . Hemoglobin A1c  . Lipid panel  . T4, free  . TSH  . VITAMIN D 25 Hydroxy (Vit-D Deficiency, Fractures)  . Vitamin B12  . Phosphorus  . Magnesium    Meds ordered this encounter  Medications  . chlorthalidone (HYGROTON) 25  MG tablet    Sig: Take 1/2 (one-half) tablet by mouth once daily.    Dispense:  45 tablet    Refill:  1  . naproxen (NAPROSYN) 500 MG tablet    Sig: Take 1 tablet (500 mg total) by mouth 2 (two) times daily with a meal.    Dispense:  60 tablet    Refill:  0  . cyclobenzaprine (FLEXERIL) 10 MG tablet    Sig: Take 1 tablet (10 mg total) by mouth 3 (three) times daily as needed for muscle spasms.    Dispense:  30 tablet    Refill:  0     The patient was counseled, risk factors were discussed, anticipatory guidance given.  Gross side effects, risk and benefits, and alternatives of medications discussed with patient.  Patient is aware that all medications have potential side effects and we are unable to predict every side effect or drug-drug interaction that may occur.  Expresses verbal understanding and consents to current therapy plan and treatment regimen.  Return in about 6 months (around 03/29/2018) for 6 mo--> cpe.  Please see AVS handed out to patient at the end of our visit for further patient instructions/ counseling done pertaining to today's office visit.    Note: This document was prepared using Dragon voice recognition software and may include unintentional dictation errors.   This document serves as a record of services personally performed by Thomasene Lot, DO. It  was created on her behalf by Thelma Barge, a trained medical scribe. The creation of this record is based on the scribe's personal observations and the provider's statements to them.   I have reviewed the above medical documentation for accuracy and completeness and I concur.  Thomasene Lot 10/07/17 6:41 PM    Subjective:    HPI: Tiffany Glover is a 57 y.o. female who presents to [facility] today for follow up for HTN and other issues.    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  Diet, exercise, and weight  loss She is doing the Next 56 days by Brett Canales the [employer]. She also goes to the [employer] once a week and also buys rare replacement food there. She hast lost about 30 pounds since 11-05-16. She has been reducing her sugar intake and her cravings have gone down. This is a modified keto diet, to some degree, which reduces sugar intake. She takes 2 tbsp of coconut oil every day.   Since 01-2016 she has lost about 50 lbs. She has started since Feb 28 2017, but is aiming for 1lb a week. She does not track her daily food based on how she is losing her weight.   Her ultimate weight goal is 195 lbs.   Back muscles She also wants a refill of her flexeril and naproxen. These medications alleviate her pain significantly. She has back pain due to posture problems. She goes to see a doctor and she tries doing posture poses.    HTN:  -  Her blood pressure has been controlled at home.   - Patient reports good compliance with blood pressure medications  - Denies medication S-E   - Smoking Status noted   - She denies new onset of: chest pain, exercise intolerance, shortness of breath, visual changes, headache, lower extremity swelling or claudication.   She describes when she stands up, she occasionally has symptoms of dizziness but she thought this was related to standing up too fast.   Her BP's at home have been 118/72-74. She is overall noticing a downward trend.   Goal: consistently below 130/80.   Last 3 blood pressure readings in our office are as follows: BP Readings from Last 3 Encounters:  09/26/17 121/64  08/27/17 130/80  03/21/17 (!) 110/58    Pulse Readings from Last 3 Encounters:  09/26/17 70  08/27/17 98  03/21/17 63    Filed Weights   09/26/17 1003  Weight: 238 lb 1.6 oz (108 kg)   Filed Weights   09/26/17 1003  Weight: 238 lb 1.6 oz (108 kg)      Patient Care Team    Relationship Specialty Notifications Start End  Thomasene Lot, DO PCP - General Family  Medicine  02/12/16      Lab Results  Component Value Date   CREATININE 0.51 (L) 09/26/2017   BUN 19 09/26/2017   NA 142 09/26/2017   K 3.9 09/26/2017   CL 101 09/26/2017   CO2 28 09/26/2017    Lab Results  Component Value Date   CHOL 180 09/26/2017   CHOL 191 03/11/2016    Lab Results  Component Value Date   HDL 51 09/26/2017   HDL 50 03/11/2016    Lab Results  Component Value Date   LDLCALC 108 (H) 09/26/2017   LDLCALC 119 03/11/2016    Lab Results  Component Value Date  TRIG 105 09/26/2017   TRIG 110 03/11/2016    Lab Results  Component Value Date   CHOLHDL 3.5 09/26/2017   CHOLHDL 3.8 03/11/2016    No results found for: LDLDIRECT ===================================================================   Patient Active Problem List   Diagnosis Date Noted  . Salt craving 06/11/2016    Priority: High  . New Onset- Essential hypertension 06/01/2016    Priority: High  . History of smoking 30 pack years- Quit 9/ 1/ 96 02/25/2016    Priority: High  . HLD (hyperlipidemia) 05/17/2014    Priority: High  . Elevated fasting blood sugar 05/17/2014    Priority: High  . (BMI >= 40) 09/14/2013    Priority: High  . Plantar fascial fibromatosis of right foot 11/10/2016    Priority: Medium  . Personal history of noncompliance with medical treatment and regimen 02/25/2016    Priority: Medium  . Class 2 obesity in adult 05/17/2014    Priority: Medium  . h/o Hypothyroidism 09/04/2007    Priority: Medium  . Muscle cramps in legs b/l 11/10/2016    Priority: Low  . Bilateral impacted cerumen 06/01/2016    Priority: Low  . Seborrheic dermatitis- ears, scalp 06/01/2016    Priority: Low  . Counseling on health promotion and disease prevention 06/01/2016    Priority: Low  . Vitamin D insufficiency 03/26/2016    Priority: Low  . History of laparoscopic cholecystectomy 02/25/2016    Priority: Low  . Family history of suicide- father age 6 02/25/2016    Priority: Low   . Family history of alcoholism in father; grandfathers etc 02/25/2016    Priority: Low  . 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 Allergic rhinitis/ seasonal allergies 05/17/2014    Priority: Low  . h/o Hemorrhoids, internal, with bleeding 09/04/2007    Priority: Low  . h/o Hemorrhoids, external 09/04/2007    Priority: Low  . History of diverticulosis 09/04/2007    Priority: Low  . Chronic bilateral thoracic back pain 09/26/2017  . Myalgia 09/26/2017  . Person with feared health complaint in whom no diagnosis is made 06/01/2016  . Elevated blood pressure reading without diagnosis of hypertension 02/25/2016  . Incisional hernia 09/15/2014  . Incisional hernia, without obstruction or gangrene 08/08/2014  . DUB (dysfunctional uterine bleeding) 05/17/2014  . IFG (impaired fasting glucose) 05/17/2014  . Menopausal symptoms 05/17/2014  . Shoulder joint pain 05/17/2014     Past Medical History:  Diagnosis Date  . Allergy   . Complication of anesthesia    hard to wake up once  . Hypertension   . Hypothyroidism    pt unaware of this and takes no medications   . Mild stress incontinence   . Morbid obesity with BMI of 40.0-44.9, adult (HCC)   . Obesity (BMI 30-39.9) 07/25/2012  . Ventral incisional hernia      Past Surgical History:  Procedure Laterality Date  . CESAREAN SECTION    . CESAREAN SECTION    . CHOLECYSTECTOMY  07/21/2012   Procedure: LAPAROSCOPIC CHOLECYSTECTOMY WITH INTRAOPERATIVE CHOLANGIOGRAM;  Surgeon: Emelia Loron, MD;  Location: [facility];  Service: General;  Laterality: N/A;  attempted  . CHOLECYSTECTOMY  07/21/2012   Procedure: CHOLECYSTECTOMY;  Surgeon: Emelia Loron, MD;  Location: [facility];  Service: General;  Laterality: N/A;  . COLONOSCOPY  02/17/2007  . FOOT SURGERY     ganglion cyst removed from right foot  . HERNIA REPAIR  09/2014  . INCISIONAL HERNIA REPAIR N/A 09/15/2014  Procedure: LAPAROSCOPIC INCISIONAL HERNIA REPAIR  WITH MESH;  Surgeon: Emelia LoronMatthew Wakefield, MD;  Location: [facility] OR;  Service: General;  Laterality: N/A;  . vein strippiing     left leg     Family History  Problem Relation Age of Onset  . Cancer Mother        Lung  . Suicidality Father   . Alcohol abuse Father   . Cancer Brother        mouth  . Cancer Maternal Grandmother   . Breast cancer Maternal Grandmother   . Alcohol abuse Maternal Grandfather   . Urolithiasis Son   . Hyperlipidemia Paternal Grandmother   . Hypertension Paternal Grandmother   . Alcohol abuse Paternal Grandfather   . Colon cancer Neg Hx   . Colon polyps Neg Hx   . Esophageal cancer Neg Hx   . Rectal cancer Neg Hx   . Stomach cancer Neg Hx      Social History   Substance and Sexual Activity  Drug Use No  ,  Social History   Substance and Sexual Activity  Alcohol Use Yes   Comment: Occassional Use  ,  Social History   Tobacco Use  Smoking Status Former Smoker  . Packs/day: 1.50  . Years: 20.00  . Pack years: 30.00  . Last attempt to quit: 03/02/1995  . Years since quitting: 22.6  Smokeless Tobacco Never Used  ,    Current Outpatient Medications on File Prior to Visit  Medication Sig Dispense Refill  . Cholecalciferol (VITAMIN D3) 5000 units TABS 5,000 IU OTC vitamin D3 daily. 90 tablet 3  . cholestyramine (QUESTRAN) 4 g packet Take 1 packet (4 g total) by mouth daily. 1/4 tsp daily from packet 60 each 11  . Multiple Vitamin (MULTIVITAMIN) tablet Take 1 tablet by mouth daily.    Marland Kitchen. triamcinolone cream (KENALOG) 0.5 % Apply 1 application topically 2 (two) times daily. To affected areas. (Patient taking differently: Apply 1 application topically 2 (two) times daily. To affected areas as needed) 60 g 1   No current facility-administered medications on file prior to visit.      No Known Allergies   Review of Systems:   General:  Denies fever, chills Optho/Auditory:   Denies visual changes, blurred vision Respiratory:   Denies SOB, cough,  wheeze, DIB  Cardiovascular:   Denies chest pain, palpitations, painful respirations Gastrointestinal:   Denies nausea, vomiting, diarrhea.  Endocrine:     Denies new hot or cold intolerance Musculoskeletal:  Denies joint swelling, gait issues, or new unexplained myalgias/ arthralgias Skin:  Denies rash, suspicious lesions  Neurological:    Denies dizziness, unexplained weakness, numbness  Psychiatric/Behavioral:   Denies mood changes  Objective:    Blood pressure 121/64, pulse 70, height 5\' 5"  (1.651 m), weight 238 lb 1.6 oz (108 kg), last menstrual period 09/08/2014, SpO2 100 %.  Body mass index is 39.62 kg/m.  General: Well Developed, well nourished, and in no acute distress.  HEENT: Normocephalic, atraumatic, pupils equal round reactive to light, neck supple, No carotid bruits, no JVD Skin: Warm and dry, cap RF less 2 sec Cardiac: Regular rate and rhythm, S1, S2 WNL's, no murmurs rubs or gallops Respiratory: ECTA B/L, Not using accessory muscles, speaking in full sentences. NeuroM-Sk: Ambulates w/o assistance, moves ext * 4 w/o difficulty, sensation grossly intact.  Ext: scant edema b/l lower ext Psych: No HI/SI, judgement and insight good, Euthymic mood. Full Affect.

## 2017-09-26 NOTE — Patient Instructions (Signed)
Please realize, EXERCISE IS MEDICINE!  -  American Heart Association [facility]) guidelines for exercise : If you are in good health, without any medical conditions, you should engage in 150 minutes of moderate intensity aerobic activity per week.  This means you should be huffing and puffing throughout your workout.   Engaging in regular exercise will improve brain function and memory, as well as improve mood, boost immune system and help with weight management.  As well as the other, more well-known effects of exercise such as decreasing blood sugar levels, decreasing blood pressure,  and decreasing bad cholesterol levels/ increasing good cholesterol levels.     -  The AHA strongly endorses consumption of a diet that contains a variety of foods from all the food categories with an emphasis on fruits and vegetables; fat-free and low-fat dairy products; cereal and grain products; legumes and nuts; and fish, poultry, and/or extra lean meats.    Excessive food intake, especially of foods high in saturated and trans fats, sugar, and salt, should be avoided.    Adequate water intake of roughly 1/2 of your weight in pounds, should equal the ounces of water per day you should drink.  So for instance, if you're 200 pounds, that would be 100 ounces of water per day.         Mediterranean Diet  Why follow it? Research shows. . Those who follow the Mediterranean diet have a reduced risk of heart disease  . The diet is associated with a reduced incidence of Parkinson's and Alzheimer's diseases . People following the diet may have longer life expectancies and lower rates of chronic diseases  . The Dietary Guidelines for Americans recommends the Mediterranean diet as an eating plan to promote health and prevent disease  What Is the Mediterranean Diet?  . Healthy eating plan based on typical foods and recipes of Mediterranean-style cooking . The diet is primarily a plant based diet; these foods should make up a  majority of meals   Starches - Plant based foods should make up a majority of meals - They are an important sources of vitamins, minerals, energy, antioxidants, and fiber - Choose whole grains, foods high in fiber and minimally processed items  - Typical grain sources include wheat, oats, barley, corn, brown rice, bulgar, farro, millet, polenta, couscous  - Various types of beans include chickpeas, lentils, fava beans, black beans, white beans   Fruits  Veggies - Large quantities of antioxidant rich fruits & veggies; 6 or more servings  - Vegetables can be eaten raw or lightly drizzled with oil and cooked  - Vegetables common to the traditional Mediterranean Diet include: artichokes, arugula, beets, broccoli, brussel sprouts, cabbage, carrots, celery, collard greens, cucumbers, eggplant, kale, leeks, lemons, lettuce, mushrooms, okra, onions, peas, peppers, potatoes, pumpkin, radishes, rutabaga, shallots, spinach, sweet potatoes, turnips, zucchini - Fruits common to the Mediterranean Diet include: apples, apricots, avocados, cherries, clementines, dates, figs, grapefruits, grapes, melons, nectarines, oranges, peaches, pears, pomegranates, strawberries, tangerines  Fats - Replace butter and margarine with healthy oils, such as olive oil, canola oil, and tahini  - Limit nuts to no more than a handful a day  - Nuts include walnuts, almonds, pecans, pistachios, pine nuts  - Limit or avoid candied, honey roasted or heavily salted nuts - Olives are central to the Mediterranean diet - can be eaten whole or used in a variety of dishes   Meats Protein - Limiting red meat: no more than a few times a month -  When eating red meat: choose lean cuts and keep the portion to the size of deck of cards - Eggs: approx. 0 to 4 times a week  - Fish and lean poultry: at least 2 a week  - Healthy protein sources include, chicken, turkey, lean beef, lamb - Increase intake of seafood such as tuna, salmon, trout,  mackerel, shrimp, scallops - Avoid or limit high fat processed meats such as sausage and bacon  Dairy - Include moderate amounts of low fat dairy products  - Focus on healthy dairy such as fat free yogurt, skim milk, low or reduced fat cheese - Limit dairy products higher in fat such as whole or 2% milk, cheese, ice cream  Alcohol - Moderate amounts of red wine is ok  - No more than 5 oz daily for women (all ages) and men older than age 65  - No more than 10 oz of wine daily for men younger than 65  Other - Limit sweets and other desserts  - Use herbs and spices instead of salt to flavor foods  - Herbs and spices common to the traditional Mediterranean Diet include: basil, bay leaves, chives, cloves, cumin, fennel, garlic, lavender, marjoram, mint, oregano, parsley, pepper, rosemary, sage, savory, sumac, tarragon, thyme   It's not just a diet, it's a lifestyle:  . The Mediterranean diet includes lifestyle factors typical of those in the region  . Foods, drinks and meals are best eaten with others and savored . Daily physical activity is important for overall good health . This could be strenuous exercise like running and aerobics . This could also be more leisurely activities such as walking, housework, yard-work, or taking the stairs . Moderation is the key; a balanced and healthy diet accommodates most foods and drinks . Consider portion sizes and frequency of consumption of certain foods   Meal Ideas & Options:  . Breakfast:  o Whole wheat toast or whole wheat English muffins with peanut butter & hard boiled egg o Steel cut oats topped with apples & cinnamon and skim milk  o Fresh fruit: banana, strawberries, melon, berries, peaches  o Smoothies: strawberries, bananas, greek yogurt, peanut butter o Low fat greek yogurt with blueberries and granola  o Egg white omelet with spinach and mushrooms o Breakfast couscous: whole wheat couscous, apricots, skim milk, cranberries  . Sandwiches:   o Hummus and grilled vegetables (peppers, zucchini, squash) on whole wheat bread   o Grilled chicken on whole wheat pita with lettuce, tomatoes, cucumbers or tzatziki  o Tuna salad on whole wheat bread: tuna salad made with greek yogurt, olives, red peppers, capers, green onions o Garlic rosemary lamb pita: lamb sauted with garlic, rosemary, salt & pepper; add lettuce, cucumber, greek yogurt to pita - flavor with lemon juice and black pepper  . Seafood:  o Mediterranean grilled salmon, seasoned with garlic, basil, parsley, lemon juice and black pepper o Shrimp, lemon, and spinach whole-grain pasta salad made with low fat greek yogurt  o Seared scallops with lemon orzo  o Seared tuna steaks seasoned salt, pepper, coriander topped with tomato mixture of olives, tomatoes, olive oil, minced garlic, parsley, green onions and cappers  . Meats:  o Herbed greek chicken salad with kalamata olives, cucumber, feta  o Red bell peppers stuffed with spinach, bulgur, lean ground beef (or lentils) & topped with feta   o Kebabs: skewers of chicken, tomatoes, onions, zucchini, squash  o Turkey burgers: made with red onions, mint, dill, lemon juice, feta   cheese topped with roasted red peppers . Vegetarian o Cucumber salad: cucumbers, artichoke hearts, celery, red onion, feta cheese, tossed in olive oil & lemon juice  o Hummus and whole grain pita points with a greek salad (lettuce, tomato, feta, olives, cucumbers, red onion) o Lentil soup with celery, carrots made with vegetable broth, garlic, salt and pepper  o Tabouli salad: parsley, bulgur, mint, scallions, cucumbers, tomato, radishes, lemon juice, olive oil, salt and peppers     Risk factors for prediabetes and type 2 diabetes  Researchers don't fully understand why some people develop prediabetes and type 2 diabetes and others don't.  It's clear that certain factors increase the risk, however, including:  Weight. The more fatty tissue you have, the  more resistant your cells become to insulin.  Inactivity. The less active you are, the greater your risk. Physical activity helps you control your weight, uses up glucose as energy and makes your cells more sensitive to insulin.  Family history. Your risk increases if a parent or sibling has type 2 diabetes.  Race. Although it's unclear why, people of certain races - including blacks, Hispanics, American Indians and Asian-Americans - are at higher risk.  Age. Your risk increases as you get older. This may be because you tend to exercise less, lose muscle mass and gain weight as you age. But type 2 diabetes is also increasing dramatically among children, adolescents and younger adults.  Gestational diabetes. If you developed gestational diabetes when you were pregnant, your risk of developing prediabetes and type 2 diabetes later increases. If you gave birth to a baby weighing more than 9 pounds (4 kilograms), you're also at risk of type 2 diabetes.  Polycystic ovary syndrome. For women, having polycystic ovary syndrome - a common condition characterized by irregular menstrual periods, excess hair growth and obesity - increases the risk of diabetes.  High blood pressure. Having blood pressure over 140/90 millimeters of mercury (mm Hg) is linked to an increased risk of type 2 diabetes.  Abnormal cholesterol and triglyceride levels. If you have low levels of high-density lipoprotein (HDL), or "good," cholesterol, your risk of type 2 diabetes is higher. Triglycerides are another type of fat carried in the blood. People with high levels of triglycerides have an increased risk of type 2 diabetes. Your doctor can let you know what your cholesterol and triglyceride levels are.  A good guide to good carbs: The glycemic index ---If you have diabetes, or at risk for diabetes, you know all too well that when you eat carbohydrates, your blood sugar goes up. The total amount of carbs you consume at a meal or in a snack  mostly determines what your blood sugar will do. But the food itself also plays a role. A serving of white rice has almost the same effect as eating pure table sugar - a quick, high spike in blood sugar. A serving of lentils has a slower, smaller effect.  ---Picking good sources of carbs can help you control your blood sugar and your weight. Even if you don't have diabetes, eating healthier carbohydrate-rich foods can help ward off a host of chronic conditions, from heart disease to various cancers to, well, diabetes.  ---One way to choose foods is with the glycemic index (GI). This tool measures how much a food boosts blood sugar.  The glycemic index rates the effect of a specific amount of a food on blood sugar compared with the same amount of pure glucose. A food with a glycemic index  of 28 boosts blood sugar only 28% as much as pure glucose. One with a GI of 95 acts like pure glucose.    High glycemic foods result in a quick spike in insulin and blood sugar (also known as blood glucose).  Low glycemic foods have a slower, smaller effect- these are healthier for you.   Using the glycemic index Using the glycemic index is easy: choose foods in the low GI category instead of those in the high GI category (see below), and go easy on those in between. Low glycemic index (GI of 55 or less): Most fruits and vegetables, beans, minimally processed grains, pasta, low-fat dairy foods, and nuts.  Moderate glycemic index (GI 56 to 69): White and sweet potatoes, corn, white rice, couscous, breakfast cereals such as Cream of Wheat and Mini Wheats.  High glycemic index (GI of 70 or higher): White bread, rice cakes, most crackers, bagels, cakes, doughnuts, croissants, most packaged breakfast cereals. You can see the values for 100 commons foods and get links to more at [email or web address].  Swaps for lowering glycemic index  Instead of this high-glycemic index food Eat this lower-glycemic index food   White rice Brown rice or converted rice  Instant oatmeal Steel-cut oats  Cornflakes Bran flakes  Baked potato Pasta, bulgur  White bread Whole-grain bread  Corn Peas or leafy greens       Prediabetes Eating Plan  Prediabetes--also called impaired glucose tolerance or impaired fasting glucose--is a condition that causes blood sugar (blood glucose) levels to be higher than normal. Following a healthy diet can help to keep prediabetes under control. It can also help to lower the risk of type 2 diabetes and heart disease, which are increased in people who have prediabetes. Along with regular exercise, a healthy diet:  Promotes weight loss.  Helps to control blood sugar levels.  Helps to improve the way that the body uses insulin.   WHAT DO I NEED TO KNOW ABOUT THIS EATING PLAN?   Use the glycemic index (GI) to plan your meals. The index tells you how quickly a food will raise your blood sugar. Choose low-GI foods. These foods take a longer time to raise blood sugar.  Pay close attention to the amount of carbohydrates in the food that you eat. Carbohydrates increase blood sugar levels.  Keep track of how many calories you take in. Eating the right amount of calories will help you to achieve a healthy weight. Losing about 7 percent of your starting weight can help to prevent type 2 diabetes.  You may want to follow a Mediterranean diet. This diet includes a lot of vegetables, lean meats or fish, whole grains, fruits, and healthy oils and fats.   WHAT FOODS CAN I EAT?  Grains Whole grains, such as whole-wheat or whole-grain breads, crackers, cereals, and pasta. Unsweetened oatmeal. Bulgur. Barley. Quinoa. Brown rice. Corn or whole-wheat flour tortillas or taco shells. Vegetables Lettuce. Spinach. Peas. Beets. Cauliflower. Cabbage. Broccoli. Carrots. Tomatoes. Squash. Eggplant. Herbs. Peppers. Onions. Cucumbers. Brussels sprouts. Fruits Berries. Bananas. Apples. Oranges. Grapes.  Papaya. Mango. Pomegranate. Kiwi. Grapefruit. Cherries. Meats and Other Protein Sources Seafood. Lean meats, such as chicken and [place] or lean cuts of pork and beef. Tofu. Eggs. Nuts. Beans. Dairy Low-fat or fat-free dairy products, such as yogurt, cottage cheese, and cheese. Beverages Water. Tea. Coffee. Sugar-free or diet soda. Seltzer water. Milk. Milk alternatives, such as soy or almond milk. Condiments Mustard. Relish. Low-fat, low-sugar ketchup. Low-fat, low-sugar barbecue sauce. Low-fat or fat-free  mayonnaise. Sweets and Desserts Sugar-free or low-fat pudding. Sugar-free or low-fat ice cream and other frozen treats. Fats and Oils Avocado. Walnuts. Olive oil. The items listed above may not be a complete list of recommended foods or beverages. Contact your dietitian for more options.    WHAT FOODS ARE NOT RECOMMENDED?  Grains Refined white flour and flour products, such as bread, pasta, snack foods, and cereals. Beverages Sweetened drinks, such as sweet iced tea and soda. Sweets and Desserts Baked goods, such as cake, cupcakes, pastries, cookies, and cheesecake. The items listed above may not be a complete list of foods and beverages to avoid. Contact your dietitian for more information.   This information is not intended to replace advice given to you by your health care provider. Make sure you discuss any questions you have with your health care provider.   Document Released: 11/01/2014 Document Reviewed: 11/01/2014 Elsevier Interactive Patient Education [employer]

## 2017-09-27 LAB — CBC WITH DIFFERENTIAL/PLATELET
BASOS ABS: 0 10*3/uL (ref 0.0–0.2)
BASOS: 1 %
EOS (ABSOLUTE): 0.1 10*3/uL (ref 0.0–0.4)
Eos: 3 %
Hematocrit: 41.1 % (ref 34.0–46.6)
Hemoglobin: 14.1 g/dL (ref 11.1–15.9)
IMMATURE GRANULOCYTES: 0 %
Immature Grans (Abs): 0 10*3/uL (ref 0.0–0.1)
Lymphocytes Absolute: 1.2 10*3/uL (ref 0.7–3.1)
Lymphs: 35 %
MCH: 29.1 pg (ref 26.6–33.0)
MCHC: 34.3 g/dL (ref 31.5–35.7)
MCV: 85 fL (ref 79–97)
MONOS ABS: 0.4 10*3/uL (ref 0.1–0.9)
Monocytes: 12 %
NEUTROS ABS: 1.7 10*3/uL (ref 1.4–7.0)
NEUTROS PCT: 49 %
Platelets: 197 10*3/uL (ref 150–379)
RBC: 4.84 x10E6/uL (ref 3.77–5.28)
RDW: 14.2 % (ref 12.3–15.4)
WBC: 3.5 10*3/uL (ref 3.4–10.8)

## 2017-09-27 LAB — COMPREHENSIVE METABOLIC PANEL
A/G RATIO: 1.5 (ref 1.2–2.2)
ALT: 14 IU/L (ref 0–32)
AST: 15 IU/L (ref 0–40)
Albumin: 4.2 g/dL (ref 3.5–5.5)
Alkaline Phosphatase: 69 IU/L (ref 39–117)
BILIRUBIN TOTAL: 0.2 mg/dL (ref 0.0–1.2)
BUN/Creatinine Ratio: 37 — ABNORMAL HIGH (ref 9–23)
BUN: 19 mg/dL (ref 6–24)
CHLORIDE: 101 mmol/L (ref 96–106)
CO2: 28 mmol/L (ref 20–29)
Calcium: 9.7 mg/dL (ref 8.7–10.2)
Creatinine, Ser: 0.51 mg/dL — ABNORMAL LOW (ref 0.57–1.00)
GFR calc Af Amer: 123 mL/min/{1.73_m2} (ref >59)
GFR calc non Af Amer: 107 mL/min/{1.73_m2} (ref >59)
GLUCOSE: 98 mg/dL (ref 65–99)
Globulin, Total: 2.8 g/dL (ref 1.5–4.5)
POTASSIUM: 3.9 mmol/L (ref 3.5–5.2)
Sodium: 142 mmol/L (ref 134–144)
Total Protein: 7 g/dL (ref 6.0–8.5)

## 2017-09-27 LAB — MAGNESIUM: Magnesium: 1.7 mg/dL (ref 1.6–2.3)

## 2017-09-27 LAB — LIPID PANEL
CHOLESTEROL TOTAL: 180 mg/dL (ref 100–199)
Chol/HDL Ratio: 3.5 ratio (ref 0.0–4.4)
HDL: 51 mg/dL (ref >39)
LDL Calculated: 108 mg/dL — ABNORMAL HIGH (ref 0–99)
Triglycerides: 105 mg/dL (ref 0–149)
VLDL Cholesterol Cal: 21 mg/dL (ref 5–40)

## 2017-09-27 LAB — VITAMIN B12: VITAMIN B 12: 440 pg/mL (ref 232–1245)

## 2017-09-27 LAB — TSH: TSH: 2.1 u[IU]/mL (ref 0.450–4.500)

## 2017-09-27 LAB — PHOSPHORUS: PHOSPHORUS: 3.1 mg/dL (ref 2.5–4.5)

## 2017-09-27 LAB — HEMOGLOBIN A1C
ESTIMATED AVERAGE GLUCOSE: 123 mg/dL
HEMOGLOBIN A1C: 5.9 % — AB (ref 4.8–5.6)

## 2017-09-27 LAB — T4, FREE: Free T4: 1.27 ng/dL (ref 0.82–1.77)

## 2017-09-27 LAB — VITAMIN D 25 HYDROXY (VIT D DEFICIENCY, FRACTURES): Vit D, 25-Hydroxy: 56 ng/mL (ref 30.0–100.0)

## 2017-10-08 ENCOUNTER — Encounter: Payer: Self-pay | Admitting: Physician Assistant

## 2017-10-10 ENCOUNTER — Telehealth: Payer: Self-pay | Admitting: Family Medicine

## 2017-10-10 NOTE — Telephone Encounter (Signed)
Patient states Tiffany Glover called her--returning call.  --glh

## 2017-10-15 NOTE — Telephone Encounter (Signed)
Patient was called back and notified of her lab results. MPulliam, CMA/RT(R)

## 2018-02-04 ENCOUNTER — Encounter: Payer: Self-pay | Admitting: Physician Assistant

## 2018-02-04 ENCOUNTER — Ambulatory Visit: Payer: BC Managed Care – PPO | Admitting: Physician Assistant

## 2018-02-04 VITALS — BP 138/75 | HR 82 | Temp 98.7°F | Resp 17 | Ht 64.5 in | Wt 234.0 lb

## 2018-02-04 DIAGNOSIS — G8929 Other chronic pain: Secondary | ICD-10-CM

## 2018-02-04 DIAGNOSIS — M546 Pain in thoracic spine: Secondary | ICD-10-CM

## 2018-02-04 DIAGNOSIS — H5789 Other specified disorders of eye and adnexa: Secondary | ICD-10-CM | POA: Diagnosis not present

## 2018-02-04 MED ORDER — MELOXICAM 7.5 MG PO TABS: 7.5000 mg | ORAL_TABLET | Freq: Every day | ORAL | 1 refills | Status: DC

## 2018-02-04 MED ORDER — BACLOFEN 10 MG PO TABS: 5.0000 mg | ORAL_TABLET | Freq: Three times a day (TID) | ORAL | 1 refills | Status: DC

## 2018-02-04 NOTE — Progress Notes (Signed)
Tiffany GhaziRhonda C Blackwell  MRN: [id number] DOB: 1960/10/27  PCP: Thomasene Lotpalski, Deborah, DO  Subjective:  Pt is a right-handed 57 year old female who presents to clinic for multiple complaints.  Right eye swelling for the past 2 days.  Swelling is present when she wakes up from sleep.  Swelling is located on her eye closest to her nose and beneath her eye.  Swelling gradually improves throughout the morning.  She endorses recent dryness of her eyes.  She has not taken anything to make this better.  She is asymptomatic currently.  She does have a history of seasonal allergies and does not take anything for this. Denies photophobia, visual disturbances, pain, discharge  Right posterior shoulder pain x 6 months. Progressively worsening.  Pain is located between her spine and shoulder blade and radiates beneath her shoulder blade.  Pain is worse with twisting and moving her arm.  She has used tens unit, heating pad and foam roller. Foam roller helps some.  She has seen her primary care doctor for this problem and was prescribed Flexeril. Flexeril makes her feel drowsy.  She works as a Teacher, adult educationmassage therapist. Denies rash, abdominal pain, urinary symptoms, fever, chills.  Review of Systems  Constitutional: Negative for chills, diaphoresis, fatigue and fever.  HENT: Positive for congestion.   Eyes: Negative for photophobia, pain, discharge, redness, itching and visual disturbance.  Respiratory: Negative for cough and wheezing.   Musculoskeletal: Positive for arthralgias, back pain and joint swelling.  Skin: Negative.   Allergic/Immunologic: Positive for environmental allergies.    Patient Active Problem List   Diagnosis Date Noted  . Chronic bilateral thoracic back pain 09/26/2017  . Myalgia 09/26/2017  . Plantar fascial fibromatosis of right foot 11/10/2016  . Muscle cramps in legs b/l 11/10/2016  . Salt craving 06/11/2016  . New Onset- Essential hypertension 06/01/2016  . Bilateral impacted cerumen  06/01/2016  . Person with feared health complaint in whom no diagnosis is made 06/01/2016  . Seborrheic dermatitis- ears, scalp 06/01/2016  . Counseling on health promotion and disease prevention 06/01/2016  . Vitamin D insufficiency 03/26/2016  . History of laparoscopic cholecystectomy 02/25/2016  . History of smoking 30 pack years- Quit 9/ 1/ 96 02/25/2016  . Family history of suicide- father age 58 02/25/2016  . Family history of alcoholism in father; grandfathers etc 02/25/2016  . 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  . Personal history of noncompliance with medical treatment and regimen 02/25/2016  . Elevated blood pressure reading without diagnosis of hypertension 02/25/2016  . Incisional hernia 09/15/2014  . Incisional hernia, without obstruction or gangrene 08/08/2014  . Allergic rhinitis/ seasonal allergies 05/17/2014  . DUB (dysfunctional uterine bleeding) 05/17/2014  . HLD (hyperlipidemia) 05/17/2014  . Elevated fasting blood sugar 05/17/2014  . Class 2 obesity in adult 05/17/2014  . IFG (impaired fasting glucose) 05/17/2014  . Menopausal symptoms 05/17/2014  . Shoulder joint pain 05/17/2014  . (BMI >= 40) 09/14/2013  . h/o Hypothyroidism 09/04/2007  . h/o Hemorrhoids, internal, with bleeding 09/04/2007  . h/o Hemorrhoids, external 09/04/2007  . History of diverticulosis 09/04/2007    Current Outpatient Medications on File Prior to Visit  Medication Sig Dispense Refill  . Cholecalciferol (VITAMIN D3) 5000 units TABS 5,000 IU OTC vitamin D3 daily. 90 tablet 3  . cyclobenzaprine (FLEXERIL) 10 MG tablet Take 1 tablet (10 mg total) by mouth 3 (three) times daily as needed for muscle spasms. 30 tablet 0  . Multiple Vitamin (MULTIVITAMIN) tablet Take 1 tablet by mouth daily.    .Marland Kitchen  naproxen (NAPROSYN) 500 MG tablet Take 1 tablet (500 mg total) by mouth 2 (two) times daily with a meal. 60 tablet 0  . triamcinolone cream (KENALOG) 0.5 % Apply 1 application topically 2 (two)  times daily. To affected areas. (Patient taking differently: Apply 1 application topically 2 (two) times daily. To affected areas as needed) 60 g 1  . chlorthalidone (HYGROTON) 25 MG tablet Take 1/2 (one-half) tablet by mouth once daily. (Patient not taking: Reported on 02/04/2018) 45 tablet 1   No current facility-administered medications on file prior to visit.     No Known Allergies   Objective:  BP 138/75   Pulse 82   Temp 98.7 F (37.1 C) (Oral)   Resp 17   Ht 5' 4.5" (1.638 m)   Wt 234 lb (106.1 kg)   LMP 09/08/2014 (Within Days)   SpO2 98%   BMI 39.55 kg/m   Physical Exam  Eyes: Conjunctivae and EOM are normal. Right eye exhibits no discharge. Left eye exhibits no discharge.  Pulmonary/Chest:      Musculoskeletal:       Thoracic back: She exhibits tenderness. She exhibits normal range of motion, no bony tenderness, no swelling and no deformity.  Skin: No rash noted.    Assessment and Plan :  1. Chronic right-sided thoracic back pain -Patient presents with chronic right-sided back pain.  Physical exam and HPI are suggestive of musculoskeletal pain.  Plan to refer to physical therapy for evaluation and treatment.  Return to clinic in 3 to 4 weeks to recheck symptoms, consider imaging. - meloxicam (MOBIC) 7.5 MG tablet; Take 1-2 tablets (7.5-15 mg total) by mouth daily.  Dispense: 30 tablet; Refill: 1 - Ambulatory referral to Physical Therapy - baclofen (LIORESAL) 10 MG tablet; Take 0.5 tablets (5 mg total) by mouth 3 (three) times daily. If needed, after 7 days you can take 10 mg 3 times daily.  Dispense: 30 each; Refill: 1  2. Periorbital swelling - Suspect intermittent allergies.  Advised antihistamine eyedrops and over-the-counter oral antihistamines.  Advised patient to sleep on her back instead of her sides and apply cool compress. RTC if no improvement. She understands and agrees with plan.    Marco Collie, PA-C  [facility] 02/04/2018 5:12 PM  Please note: Portions of this report may have been transcribed using dragon voice recognition software. Every effort was made to ensure accuracy; however, inadvertent computerized transcription errors may be present.

## 2018-02-04 NOTE — Patient Instructions (Addendum)
For your eyes: let's treat for allergies.  Opcon-A or Naphcon-A allergy eye drops -- use this at night before bed.  Also try oral allergy pill at night. (try Zyrtec or Claritin) Try a air purifier in your room at night.  Sleep on your back instead of your side - this will reduce swelling of your eyes.  Cool compresses in the morning will help   You will receive a phone call to schedule an appointment with physical therapy.  Come back and see me in 3-4 weeks.   Thank you for coming in today. I hope you feel we met your needs.  Feel free to call PCP if you have any questions or further requests.  Please consider signing up for MyChart if you do not already have it, as this is a great way to communicate with me.  Best,  Whitney McVey, PA-C  IF you received an x-ray today, you will receive an invoice from [facility]. Please contact [facility] at [phone] with questions or concerns regarding your invoice.   IF you received labwork today, you will receive an invoice from [facility]. Please contact [facility] at [phone] with questions or concerns regarding your invoice.   Our billing staff will not be able to assist you with questions regarding bills from these companies.  You will be contacted with the lab results as soon as they are available. The fastest way to get your results is to activate your My Chart account. Instructions are located on the last page of this paperwork. If you have not heard from [place] regarding the results in 2 weeks, please contact this office.

## 2018-03-18 ENCOUNTER — Ambulatory Visit: Payer: BC Managed Care – PPO | Admitting: Family Medicine

## 2018-03-18 ENCOUNTER — Encounter: Payer: Self-pay | Admitting: Family Medicine

## 2018-03-18 VITALS — BP 119/84 | HR 70 | Ht 65.0 in | Wt 238.9 lb

## 2018-03-18 DIAGNOSIS — R3989 Other symptoms and signs involving the genitourinary system: Secondary | ICD-10-CM | POA: Diagnosis not present

## 2018-03-18 DIAGNOSIS — R3129 Other microscopic hematuria: Secondary | ICD-10-CM

## 2018-03-18 DIAGNOSIS — R7302 Impaired glucose tolerance (oral): Secondary | ICD-10-CM | POA: Insufficient documentation

## 2018-03-18 DIAGNOSIS — M62838 Other muscle spasm: Secondary | ICD-10-CM | POA: Insufficient documentation

## 2018-03-18 DIAGNOSIS — R7309 Other abnormal glucose: Secondary | ICD-10-CM

## 2018-03-18 DIAGNOSIS — R252 Cramp and spasm: Secondary | ICD-10-CM

## 2018-03-18 DIAGNOSIS — E860 Dehydration: Secondary | ICD-10-CM

## 2018-03-18 DIAGNOSIS — G8929 Other chronic pain: Secondary | ICD-10-CM | POA: Insufficient documentation

## 2018-03-18 DIAGNOSIS — M546 Pain in thoracic spine: Secondary | ICD-10-CM

## 2018-03-18 LAB — POCT URINALYSIS DIPSTICK
Bilirubin, UA: NEGATIVE
GLUCOSE UA: NEGATIVE
Leukocytes, UA: NEGATIVE
Nitrite, UA: NEGATIVE
PROTEIN UA: POSITIVE — AB
Urobilinogen, UA: 0.2 E.U./dL
pH, UA: 5.5 (ref 5.0–8.0)

## 2018-03-18 LAB — POCT GLYCOSYLATED HEMOGLOBIN (HGB A1C): HEMOGLOBIN A1C: 5.8 % — AB (ref 4.0–5.6)

## 2018-03-18 MED ORDER — CYCLOBENZAPRINE HCL 10 MG PO TABS: 10.0000 mg | ORAL_TABLET | Freq: Three times a day (TID) | ORAL | 0 refills | Status: DC | PRN

## 2018-03-18 NOTE — Patient Instructions (Signed)
For 5 to 7 days you are going to take the ibuprofen or Aleve as well as the Flexeril around-the-clock.  Also please try to avoid overuse of that right upper extremity and please relax that muscle.  Have your husband do ice cup massages for 10 minutes a couple times a day if possible.  Otherwise please do not get massages do percussion massage etc. please rest the area.  If it still hurting after 5 to 7 days of good care, then I would highly recommend the next step to be physical therapy to include dexamethasone tens unit treatment as well as ultrasound etc.  For your microscopic hematuria.  Your specific gravity is very high and you have a small amount of blood.  Is very important you drink adequate amounts of water which equals one half your weight in ounces of water per day.  If you are out in a sweaty hot environment or exercising you need to drink even more.  We will obtain some blood work today and in a couple of months I recommend you follow-up again with me for a another UA in th office to see if you still have blood there.  If it remains after 2 months and you have been adequately hydrating during that time we will then send you to urology for further evaluation of your microscopic hematuria.  The muscle spasms are coming from your dehydration-which is making your urine worse and the muscle spasms in your back worse   Leg Cramps Leg cramps occur when a muscle or muscles tighten and you have no control over this tightening (involuntary muscle contraction). Muscle cramps can develop in any muscle, but the most common place is in the calf muscles of the leg. Those cramps can occur during exercise or when you are at rest. Leg cramps are painful, and they may last for a few seconds to a few minutes. Cramps may return several times before they finally stop. Usually, leg cramps are not caused by a serious medical problem. In many cases, the cause is not known. Some common causes  include:  Overexertion.  Overuse from repetitive motions, or doing the same thing over and over.  Remaining in a certain position for a long period of time.  Improper preparation, form, or technique while performing a sport or an activity.  Dehydration.  Injury.  Side effects of some medicines.  Abnormally low levels of the salts and ions in your blood (electrolytes), especially potassium and calcium. These levels could be low if you are taking water pills (diuretics) or if you are pregnant.  Follow these instructions at home: Watch your condition for any changes. Taking the following actions may help to lessen any discomfort that you are feeling:  Stay well-hydrated. Drink enough fluid to keep your urine clear or pale yellow.  Try massaging, stretching, and relaxing the affected muscle. Do this for several minutes at a time.  For tight or tense muscles, use a warm towel, heating pad, or hot shower water directed to the affected area.  If you are sore or have pain after a cramp, applying ice to the affected area may relieve discomfort. ? Put ice in a plastic bag. ? Place a towel between your skin and the bag. ? Leave the ice on for 20 minutes, 2-3 times per day.  Avoid strenuous exercise for several days if you have been having frequent leg cramps.  Make sure that your diet includes the essential minerals for your muscles to work  normally.  Take medicines only as directed by your health care provider.  Contact a health care provider if:  Your leg cramps get more severe or more frequent, or they do not improve over time.  Your foot becomes cold, numb, or blue. This information is not intended to replace advice given to you by your health care provider. Make sure you discuss any questions you have with your health care provider. Document Released: 07/25/2004 Document Revised: 11/23/2015 Document Reviewed: 05/25/2014 Elsevier Interactive Patient Education  [employer]2018 [employer]  Inc[employer]

## 2018-03-18 NOTE — Progress Notes (Signed)
Pt here for an acute care OV today   Impression and Recommendations:    1. Urine discoloration   2. Other microscopic hematuria   3. Elevated hemoglobin A1c   4. Glucose intolerance (impaired glucose tolerance)   5. Night muscle spasms-bilateral calves and hamstrings.   6. Chronic right-sided thoracic back pain-muscle spasms medial to right scapula and inferior to right scapula   7. Dehydration, mild   8. Muscle cramps in legs b/l   9. Chronic bilateral thoracic back pain     1. meds per below. 2. Red flag sx d/c pt, none present today and if dev- will let us know 3. For 5 to 7 days you are going to take the ibuprofen or Aleve as well as the Flexeril around-the-clock.   4. Also please try to avoid overuse of that right upper extremity and please relax that muscle.   5. Have your husband do ice cup massages for 10 minutes a couple times a day if possible.   6. Otherwise please do not get massages do percussion massage etc. please rest the area.   7. If it still hurting after 5 to 7 days of good care, then I would highly recommend the next step to be physical therapy to include dexamethasone tens unit treatment as well as ultrasound etc. 8. For your microscopic hematuria.  Your specific gravity is very high and you have a small amount of blood.  Is very important you drink adequate amounts of water which equals one half your weight in ounces of water per day.  If you are out in a sweaty hot environment or exercising you need to drink even more.   9. obtain blood work today and in a couple of months I recommend you follow-up again with me for a another UA in th office to see if you still have blood there.   10. If it remains after 2 months and you have been adequately hydrating during that time we will then send you to urology for further evaluation of your microscopic hematuria. 11. The muscle spasms are coming from/ being compounded by your dehydration-which is making your urine  worse and the muscle spasms in your back worse -  All pt questions answered, pt agrees to txmnt plan   Meds ordered this encounter  Medications  . cyclobenzaprine (FLEXERIL) 10 MG tablet    Sig: Take 1 tablet (10 mg total) by mouth 3 (three) times daily as needed for muscle spasms.    Dispense:  30 tablet    Refill:  0    Medications Discontinued During This Encounter  Medication Reason  . baclofen (LIORESAL) 10 MG tablet Completed Course  . meloxicam (MOBIC) 7.5 MG tablet Completed Course  . cyclobenzaprine (FLEXERIL) 10 MG tablet Reorder     Orders Placed This Encounter  Procedures  . BASIC METABOLIC PANEL WITH GFR  . Magnesium  . Comprehensive metabolic panel  . Specimen status report  . POCT glycosylated hemoglobin (Hb A1C)  . POCT urinalysis dipstick     Education and routine counseling performed. Handouts provided  Gross side effects, risk and benefits, and alternatives of medications and treatment plan in general discussed with patient.  Patient is aware that all medications have potential side effects and we are unable to predict every side effect or drug-drug interaction that may occur.   Patient will call with any questions prior to using medication if they have concerns.  Expresses verbal understanding and consents to  current therapy and treatment regimen.  No barriers to understanding were identified.  Red flag symptoms and signs discussed in detail.  Patient expressed understanding regarding what to do in case of emergency\urgent symptoms   Please see AVS handed out to patient at the end of our visit for further patient instructions/ counseling done pertaining to today's office visit.   Return for 2 months follow-up for recheck of urine analysis from hematuria as well as see how you are doing on .     Note:  This document was prepared occasionally using Dragon voice recognition software and may include unintentional dictation errors in addition to a  scribe.   Thomasene Lot 03/18/18 6:09 PM  --------------------------------------------------------------------------------------------------------------------------------------------------------------------------------------------------------------------------------------------    Subjective:    CC:  Chief Complaint  Patient presents with  . Hematuria    HPI: Tiffany Glover is a 57 y.o. female who presents to [facility] today for issues as discussed below.  Patient has also been to another provider for the same condition all of those notes as well as lab work etc. was also reviewed.    Changes to color in her urine started after a massage this past June while on.  She noticed the next day that her urine seemed very dark and almost a reddish-brown tinge to it.  She also has noticed it being very malodorous.  No dysuria, no increased frequency or urgency as well.  No family history of bladder issues or cancers.  Pt drinks 8 oz cup coffee and a 12 ounce yeti cup which she drinks 1/day of water. Occ unsweet tea later in the day.   Pt is having muscle spasms in her b/l calfs at night and at times in hamstring muscles as well.  These occur at night and can even wake her up from sleep.  Has a chronic pain in R flank region- around T 10-11 on R.  Only hurts with mvmnt when she elevates the scapula or twists to the L.   Pain feels like a "stretching something that does not want to stretch ".    Feels tight.   Tried ice, NSAIDS, flexeril, uses foam roller, massages, percussion massager apparatus, uses ball against back.   She has seen her chiropractor for this and her adjustments monthly.  He feels it is not rib related and that all her ribs are in place.       Wt Readings from Last 3 Encounters:  03/18/18 238 lb 14.4 oz (108.4 kg)  02/04/18 234 lb (106.1 kg)  09/26/17 238 lb 1.6 oz (108 kg)   BP Readings from Last 3 Encounters:  03/18/18 119/84  02/04/18  138/75  09/26/17 121/64   BMI Readings from Last 3 Encounters:  03/18/18 39.76 kg/m  02/04/18 39.55 kg/m  09/26/17 39.62 kg/m     Patient Care Team    Relationship Specialty Notifications Start End  Thomasene Lot, DO PCP - General Family Medicine  02/12/16      Patient Active Problem List   Diagnosis Date Noted  . Salt craving 06/11/2016    Priority: High  . New Onset- Essential hypertension 06/01/2016    Priority: High  . History of smoking 30 pack years- Quit 9/ 1/ 96 02/25/2016    Priority: High  . HLD (hyperlipidemia) 05/17/2014    Priority: High  . Elevated fasting blood sugar 05/17/2014    Priority: High  . (BMI >= 40) 09/14/2013    Priority: High  . Plantar  fascial fibromatosis of right foot 11/10/2016    Priority: Medium  . Personal history of noncompliance with medical treatment and regimen 02/25/2016    Priority: Medium  . Class 2 obesity in adult 05/17/2014    Priority: Medium  . h/o Hypothyroidism 09/04/2007    Priority: Medium  . Muscle cramps in legs b/l 11/10/2016    Priority: Low  . Bilateral impacted cerumen 06/01/2016    Priority: Low  . Seborrheic dermatitis- ears, scalp 06/01/2016    Priority: Low  . Counseling on health promotion and disease prevention 06/01/2016    Priority: Low  . Vitamin D insufficiency 03/26/2016    Priority: Low  . History of laparoscopic cholecystectomy 02/25/2016    Priority: Low  . Family history of suicide- father age 38 02/25/2016    Priority: Low  . Family history of alcoholism in father; grandfathers etc 02/25/2016    Priority: Low  . 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 Allergic rhinitis/ seasonal allergies 05/17/2014    Priority: Low  . h/o Hemorrhoids, internal, with bleeding 09/04/2007    Priority: Low  . h/o Hemorrhoids, external 09/04/2007    Priority: Low  . History of diverticulosis 09/04/2007    Priority: Low  . Chronic right-sided thoracic back pain-muscle spasms  medial to right scapula and inferior to right scapula 03/18/2018  . Night muscle spasms-bilateral calves and hamstrings. 03/18/2018  . Glucose intolerance (impaired glucose tolerance) 03/18/2018  . Urine discoloration 03/18/2018  . Dehydration, mild 03/18/2018  . Chronic bilateral thoracic back pain 09/26/2017  . Myalgia 09/26/2017  . Person with feared health complaint in whom no diagnosis is made 06/01/2016  . Elevated blood pressure reading without diagnosis of hypertension 02/25/2016  . Incisional hernia 09/15/2014  . Incisional hernia, without obstruction or gangrene 08/08/2014  . DUB (dysfunctional uterine bleeding) 05/17/2014  . IFG (impaired fasting glucose) 05/17/2014  . Menopausal symptoms 05/17/2014  . Shoulder joint pain 05/17/2014    Past Medical history, Surgical history, Family history, Social history, Allergies and Medications have been entered into the medical record, reviewed and changed as needed.    Current Meds  Medication Sig  . Cholecalciferol (VITAMIN D3) 5000 units TABS 5,000 IU OTC vitamin D3 daily.  . cyclobenzaprine (FLEXERIL) 10 MG tablet Take 1 tablet (10 mg total) by mouth 3 (three) times daily as needed for muscle spasms.  . Multiple Vitamin (MULTIVITAMIN) tablet Take 1 tablet by mouth daily.  . naproxen (NAPROSYN) 500 MG tablet Take 1 tablet (500 mg total) by mouth 2 (two) times daily with a meal.  . triamcinolone cream (KENALOG) 0.5 % Apply 1 application topically 2 (two) times daily. To affected areas. (Patient taking differently: Apply 1 application topically 2 (two) times daily. To affected areas as needed)  . [DISCONTINUED] cyclobenzaprine (FLEXERIL) 10 MG tablet Take 1 tablet (10 mg total) by mouth 3 (three) times daily as needed for muscle spasms.    Allergies:  No Known Allergies   Review of Systems: General:   Denies fever, chills, unexplained weight loss.  Optho/Auditory:   Denies visual changes, blurred vision/LOV Respiratory:   Denies  wheeze, DOE more than baseline levels.   Cardiovascular:   Denies chest pain, palpitations, new onset peripheral edema  Gastrointestinal:   Denies nausea, vomiting, diarrhea, abd pain.  Genitourinary: Denies dysuria, freq/ urgency, flank pain or discharge from genitals.  Endocrine:     Denies hot or cold intolerance, polyuria, polydipsia. Musculoskeletal:   Denies unexplained myalgias, joint swelling,  unexplained arthralgias, gait problems.  Skin:  Denies new onset rash, suspicious lesions Neurological:     Denies dizziness, unexplained weakness, numbness  Psychiatric/Behavioral:   Denies mood changes, suicidal or homicidal ideations, hallucinations    Objective:   Blood pressure 119/84, pulse 70, height 5\' 5"  (1.651 m), weight 238 lb 14.4 oz (108.4 kg), last menstrual period 09/08/2014, SpO2 100 %. Body mass index is 39.76 kg/m. General:  Well Developed, well nourished, appropriate for stated age.  Neuro:  Alert and oriented,  extra-ocular muscles intact  HEENT:  Normocephalic, atraumatic, neck supple Skin:  no gross rash, warm, pink. Cardiac:  RRR, S1 S2 Respiratory:  ECTA B/L and A/P, Not using accessory muscles, speaking in full sentences- unlabored. Vascular:  Ext warm, no cyanosis apprec.; cap RF less 2 sec. Psych:  No HI/SI, judgement and insight good, Euthymic mood. Full Affect.

## 2018-03-19 LAB — MAGNESIUM: MAGNESIUM: 1.9 mg/dL (ref 1.6–2.3)

## 2018-03-20 LAB — COMPREHENSIVE METABOLIC PANEL
ALT: 18 IU/L (ref 0–32)
AST: 14 IU/L (ref 0–40)
Albumin/Globulin Ratio: 1.8 (ref 1.2–2.2)
Albumin: 4.6 g/dL (ref 3.5–5.5)
Alkaline Phosphatase: 68 IU/L (ref 39–117)
BUN/Creatinine Ratio: 32 — ABNORMAL HIGH (ref 9–23)
BUN: 18 mg/dL (ref 6–24)
CALCIUM: 9.3 mg/dL (ref 8.7–10.2)
CHLORIDE: 103 mmol/L (ref 96–106)
CO2: 20 mmol/L (ref 20–29)
Creatinine, Ser: 0.56 mg/dL — ABNORMAL LOW (ref 0.57–1.00)
GFR calc non Af Amer: 104 mL/min/{1.73_m2} (ref >59)
GFR, EST AFRICAN AMERICAN: 120 mL/min/{1.73_m2} (ref >59)
Globulin, Total: 2.6 g/dL (ref 1.5–4.5)
Glucose: 90 mg/dL (ref 65–99)
Potassium: 4 mmol/L (ref 3.5–5.2)
Sodium: 143 mmol/L (ref 134–144)
TOTAL PROTEIN: 7.2 g/dL (ref 6.0–8.5)

## 2018-03-20 LAB — SPECIMEN STATUS REPORT

## 2018-04-15 ENCOUNTER — Encounter: Payer: BC Managed Care – PPO | Admitting: Family Medicine

## 2018-05-20 ENCOUNTER — Ambulatory Visit: Payer: BC Managed Care – PPO | Admitting: Family Medicine

## 2018-06-03 ENCOUNTER — Encounter: Payer: Self-pay | Admitting: Family Medicine

## 2018-06-03 ENCOUNTER — Other Ambulatory Visit: Payer: Self-pay

## 2018-06-03 ENCOUNTER — Ambulatory Visit: Payer: BC Managed Care – PPO | Admitting: Family Medicine

## 2018-06-03 VITALS — BP 128/80 | HR 71 | Temp 98.6°F | Ht 65.0 in | Wt 242.8 lb

## 2018-06-03 DIAGNOSIS — Z7189 Other specified counseling: Secondary | ICD-10-CM | POA: Diagnosis not present

## 2018-06-03 DIAGNOSIS — R7302 Impaired glucose tolerance (oral): Secondary | ICD-10-CM

## 2018-06-03 DIAGNOSIS — R7303 Prediabetes: Secondary | ICD-10-CM | POA: Insufficient documentation

## 2018-06-03 DIAGNOSIS — R3121 Asymptomatic microscopic hematuria: Secondary | ICD-10-CM

## 2018-06-03 DIAGNOSIS — Z87442 Personal history of urinary calculi: Secondary | ICD-10-CM | POA: Insufficient documentation

## 2018-06-03 LAB — POCT URINALYSIS DIPSTICK
Bilirubin, UA: NEGATIVE
GLUCOSE UA: NEGATIVE
Ketones, UA: NEGATIVE
LEUKOCYTES UA: NEGATIVE
NITRITE UA: NEGATIVE
PH UA: 6.5 (ref 5.0–8.0)
Protein, UA: NEGATIVE
RBC UA: NEGATIVE
SPEC GRAV UA: 1.01 (ref 1.010–1.025)
UROBILINOGEN UA: 0.2 U/dL

## 2018-06-03 NOTE — Patient Instructions (Signed)

## 2018-06-03 NOTE — Progress Notes (Signed)
Impression and Recommendations:    1. Asymptomatic microscopic hematuria   2. Glucose intolerance (impaired glucose tolerance)   3. elevated BMI ( HCC)   4. Counseling on health promotion and disease prevention   5. History of nephrolithiasis- not requiring surgery   6. Prediabetes     - Patient due for physical in near future.  Last seen in March of 2019.  - Last labs drawn 09/26/2017.  Discussed that patient can come in for a CPE in 4 months, around March of 2020, and have lab work drawn that same day.  1. Specialty Care & Female Health Screenings - OBGYN - Encouraged patient to continue follow up with specialists (OBGYN).  - Discussed importance of standard-of-care screenings with patient today.  Educated patient extensively about female health screening recommendations today.  - Given family history of breast cancer, recommended yearly mammograms to patient today.  2. Blood Pressure - At goal. - Reviewed that goal is 130/80 or less, ideally 119/79 or less. - Will continue to monitor. - Occasional ambulatory blood pressure monitoring encouraged at home.  3. Chronic Muscle Aches - To help alleviate pain, tension, and discomfort, encouraged patient to increase blood flow to areas of muscle pain and concerns.  - Strongly encouraged application of heat, massage, walking and regular physical activity to help relieve areas of discomfort and complaint.  - Discussed that walking for 10-15 minutes per day will help alleviate sx and improve overall health.  4. Microscopic Hematuria - Educated patient that microscopic hematuria is not considered "normal," but is often a benign finding in several individuals.  - Reassured patient that we will continue to monitor the patient for her urinary concerns.  - Patient understands that she may be sent to urology in the future if her findings continue to be abnormal.  Patient agrees to abide by this indication and  recommendation.  5. BMI Counseling - BMI of 39.8 Explained to patient what BMI refers to, and what it means medically.    Told patient to think about it as a "medical risk stratification measurement" and how increasing BMI is associated with increasing risk/ or worsening state of various diseases such as hypertension, hyperlipidemia, diabetes, premature OA, depression etc.  American Heart Association guidelines for healthy diet, basically Mediterranean diet, and exercise guidelines of 30 minutes 5 days per week or more discussed in detail.  Health counseling performed.  All questions answered.  6. Lifestyle & Preventative Health Maintenance - Advised patient to continue working toward exercising to improve overall mental, physical, and emotional health.    - Reviewed the "spokes of the wheel" of mood and health management.  Stressed the importance of ongoing prudent habits, including regular exercise, appropriate sleep hygiene, healthful dietary habits, and prayer/meditation to relax.  - Encouraged patient to engage in daily physical activity, especially a formal exercise routine.  Recommended that the patient eventually strive for at least 150 minutes of moderate cardiovascular activity per week according to guidelines established by the [facility].   - Healthy dietary habits encouraged, including low-carb, and high amounts of lean protein in diet.   - Patient should also consume adequate amounts of water.   Orders Placed This Encounter  Procedures  . POCT urinalysis dipstick    Medications Discontinued During This Encounter  Medication Reason  . chlorthalidone (HYGROTON) 25 MG tablet Discontinued by provider     Gross side effects, risk and benefits, and alternatives of medications and treatment plan in general  discussed with patient.  Patient is aware that all medications have potential side effects and we are unable to predict every side effect or drug-drug interaction that may occur.    Patient will call with any questions prior to using medication if they have concerns.    Expresses verbal understanding and consents to current therapy and treatment regimen.  No barriers to understanding were identified.  Red flag symptoms and signs discussed in detail.  Patient expressed understanding regarding what to do in case of emergency\urgent symptoms  Please see AVS handed out to patient at the end of our visit for further patient instructions/ counseling done pertaining to today's office visit.   Return for late March FBW, then ov with me 4-5 d later.     Note:  This note was prepared with assistance of Dragon voice recognition software. Occasional wrong-word or sound-a-like substitutions may have occurred due to the inherent limitations of voice recognition software.   This document serves as a record of services personally performed by Thomasene Lot, DO. It was created on her behalf by Peggye Fothergill, a trained medical scribe. The creation of this record is based on the scribe's personal observations and the provider's statements to them.   I have reviewed the above medical documentation for accuracy and completeness and I concur.  Thomasene Lot, DO   -------------------------------------------------------------------------------------------------------------------   Subjective:     HPI: Tiffany Glover is a 57 y.o. female who presents to [facility] today for issues as discussed below.  Overall Getting: States that she's been feeling good overall since last visit.  Patient was last seen in March of 2019 for chronic health maintenance, and an acute care visit in September 2019.  Hydration & Urinary Concerns Notes last visit, she came in with urinary concerns and trace blood in her urine, and was told to "hydrate, hydrate, hydrate."    Since last visit, she's been "hydrating the best that I can," "but I've found that it disrupts my  sleep tremendously."  States once in the last five years that "they thought I had a kidney stone."  "I know I seemed to have blood in my urine then; I never saw anything when I was straining, but it went away."  Blood Pressure at Home Blood pressure at home runs about 119/78-79, sometimes over 72.  Ongoing Muscle Pains Still experiencing muscle pains.  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  States she "tries to be proactive about it, but it's painful."  OBGYN Care Patient follows up with [facility].  States she just had "everything done" (mammogram, pap smears) through OBGYN.  Had an abnormal pap over 5 years ago, and has had three pap smears that were normal since then.  Patient had her latest colonoscopy last year (September 2018), with 10 year repeat.  Additionally, states she had a bone density scan within the past couple of years.    Wt Readings from Last 3 Encounters:  06/03/18 242 lb 12.8 oz (110.1 kg)  03/18/18 238 lb 14.4 oz (108.4 kg)  02/04/18 234 lb (106.1 kg)   BP Readings from Last 3 Encounters:  06/03/18 128/80  03/18/18 119/84  02/04/18 138/75   Pulse Readings from Last 3 Encounters:  06/03/18 71  03/18/18 70  02/04/18 82   BMI Readings from Last 3 Encounters:  06/03/18 40.40 kg/m  03/18/18 39.76 kg/m  02/04/18 39.55 kg/m     Patient Care Team  Relationship Specialty Notifications Start End  Thomasene Lotpalski, Kierstynn Babich, DO PCP - General Family Medicine  02/12/16      Patient Active Problem List   Diagnosis Date Noted  . Salt craving 06/11/2016    Priority: High  . New Onset- Essential hypertension 06/01/2016    Priority: High  . History of smoking 30 pack years- Quit 9/ 1/ 96 02/25/2016    Priority: High  . HLD (hyperlipidemia) 05/17/2014    Priority: High  . Elevated fasting blood sugar 05/17/2014    Priority: High  . (BMI >= 40) 09/14/2013    Priority: High  . Plantar fascial fibromatosis of right foot  11/10/2016    Priority: Medium  . Personal history of noncompliance with medical treatment and regimen 02/25/2016    Priority: Medium  . Class 2 obesity in adult 05/17/2014    Priority: Medium  . h/o Hypothyroidism 09/04/2007    Priority: Medium  . Muscle cramps in legs b/l 11/10/2016    Priority: Low  . Bilateral impacted cerumen 06/01/2016    Priority: Low  . Seborrheic dermatitis- ears, scalp 06/01/2016    Priority: Low  . Counseling on health promotion and disease prevention 06/01/2016    Priority: Low  . Vitamin D insufficiency 03/26/2016    Priority: Low  . History of laparoscopic cholecystectomy 02/25/2016    Priority: Low  . Family history of suicide- father age 58 02/25/2016    Priority: Low  . Family history of alcoholism in father; grandfathers etc 02/25/2016    Priority: Low  . 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 Allergic rhinitis/ seasonal allergies 05/17/2014    Priority: Low  . h/o Hemorrhoids, internal, with bleeding 09/04/2007    Priority: Low  . h/o Hemorrhoids, external 09/04/2007    Priority: Low  . History of diverticulosis 09/04/2007    Priority: Low  . Asymptomatic microscopic hematuria 06/03/2018  . History of nephrolithiasis- not requiring surgery 06/03/2018  . Prediabetes 06/03/2018  . Chronic right-sided thoracic back pain-muscle spasms medial to right scapula and inferior to right scapula 03/18/2018  . Night muscle spasms-bilateral calves and hamstrings. 03/18/2018  . Glucose intolerance (impaired glucose tolerance) 03/18/2018  . Urine discoloration 03/18/2018  . Dehydration, mild 03/18/2018  . Chronic bilateral thoracic back pain 09/26/2017  . Myalgia 09/26/2017  . Person with feared health complaint in whom no diagnosis is made 06/01/2016  . Elevated blood pressure reading without diagnosis of hypertension 02/25/2016  . Incisional hernia 09/15/2014  . Incisional hernia, without obstruction or gangrene 08/08/2014  .  DUB (dysfunctional uterine bleeding) 05/17/2014  . IFG (impaired fasting glucose) 05/17/2014  . Menopausal symptoms 05/17/2014  . Shoulder joint pain 05/17/2014    Past Medical history, Surgical history, Family history, Social history, Allergies and Medications have been entered into the medical record, reviewed and changed as needed.    Current Meds  Medication Sig  . Cholecalciferol (VITAMIN D3) 5000 units TABS 5,000 IU OTC vitamin D3 daily.  . cyclobenzaprine (FLEXERIL) 10 MG tablet Take 1 tablet (10 mg total) by mouth 3 (three) times daily as needed for muscle spasms.  . Multiple Vitamin (MULTIVITAMIN) tablet Take 1 tablet by mouth daily.  . naproxen (NAPROSYN) 500 MG tablet Take 1 tablet (500 mg total) by mouth 2 (two) times daily with a meal.  . triamcinolone cream (KENALOG) 0.5 % Apply 1 application topically 2 (two) times daily. To affected areas. (Patient taking differently: Apply 1 application topically 2 (two) times daily. To affected  areas as needed)    Allergies:  No Known Allergies   Review of Systems:  A fourteen system review of systems was performed and found to be positive as per HPI.   Objective:   Blood pressure 128/80, pulse 71, temperature 98.6 F (37 C), height 5\' 5"  (1.651 m), weight 242 lb 12.8 oz (110.1 kg), last menstrual period 09/08/2014, SpO2 100 %. Body mass index is 40.4 kg/m. General:  Well Developed, well nourished, appropriate for stated age.  Neuro:  Alert and oriented,  extra-ocular muscles intact  HEENT:  Normocephalic, atraumatic, neck supple, no carotid bruits appreciated  Skin:  no gross rash, warm, pink. Cardiac:  RRR, S1 S2 Respiratory:  ECTA B/L and A/P, Not using accessory muscles, speaking in full sentences- unlabored. Vascular:  Ext warm, no cyanosis apprec.; cap RF less 2 sec. Psych:  No HI/SI, judgement and insight good, Euthymic mood. Full Affect.

## 2018-06-19 LAB — HM MAMMOGRAPHY

## 2018-10-28 ENCOUNTER — Other Ambulatory Visit: Payer: Self-pay

## 2018-10-28 ENCOUNTER — Emergency Department (HOSPITAL_BASED_OUTPATIENT_CLINIC_OR_DEPARTMENT_OTHER): Payer: BC Managed Care – PPO

## 2018-10-28 ENCOUNTER — Emergency Department (HOSPITAL_BASED_OUTPATIENT_CLINIC_OR_DEPARTMENT_OTHER)
Admission: EM | Admit: 2018-10-28 | Discharge: 2018-10-28 | Disposition: A | Discharge status: Home / Self Care | Payer: BC Managed Care – PPO | Attending: Emergency Medicine | Admitting: Emergency Medicine

## 2018-10-28 ENCOUNTER — Encounter (HOSPITAL_BASED_OUTPATIENT_CLINIC_OR_DEPARTMENT_OTHER): Payer: Self-pay | Admitting: Emergency Medicine

## 2018-10-28 DIAGNOSIS — E039 Hypothyroidism, unspecified: Secondary | ICD-10-CM | POA: Insufficient documentation

## 2018-10-28 DIAGNOSIS — Z87891 Personal history of nicotine dependence: Secondary | ICD-10-CM | POA: Diagnosis not present

## 2018-10-28 DIAGNOSIS — N200 Calculus of kidney: Secondary | ICD-10-CM | POA: Diagnosis not present

## 2018-10-28 DIAGNOSIS — Z79899 Other long term (current) drug therapy: Secondary | ICD-10-CM | POA: Insufficient documentation

## 2018-10-28 DIAGNOSIS — R109 Unspecified abdominal pain: Secondary | ICD-10-CM | POA: Diagnosis present

## 2018-10-28 DIAGNOSIS — I10 Essential (primary) hypertension: Secondary | ICD-10-CM | POA: Diagnosis not present

## 2018-10-28 LAB — CBC WITH DIFFERENTIAL/PLATELET
Abs Immature Granulocytes: 0.01 10*3/uL (ref 0.00–0.07)
Basophils Absolute: 0 10*3/uL (ref 0.0–0.1)
Basophils Relative: 1 %
Eosinophils Absolute: 0.1 10*3/uL (ref 0.0–0.5)
Eosinophils Relative: 2 %
HCT: 43.4 % (ref 36.0–46.0)
Hemoglobin: 14.5 g/dL (ref 12.0–15.0)
Immature Granulocytes: 0 %
Lymphocytes Relative: 29 %
Lymphs Abs: 1.4 10*3/uL (ref 0.7–4.0)
MCH: 30.1 pg (ref 26.0–34.0)
MCHC: 33.4 g/dL (ref 30.0–36.0)
MCV: 90 fL (ref 80.0–100.0)
Monocytes Absolute: 0.6 10*3/uL (ref 0.1–1.0)
Monocytes Relative: 12 %
Neutro Abs: 2.7 10*3/uL (ref 1.7–7.7)
Neutrophils Relative %: 56 %
Platelets: 188 10*3/uL (ref 150–400)
RBC: 4.82 MIL/uL (ref 3.87–5.11)
RDW: 12.9 % (ref 11.5–15.5)
WBC: 4.8 10*3/uL (ref 4.0–10.5)
nRBC: 0 % (ref 0.0–0.2)

## 2018-10-28 LAB — COMPREHENSIVE METABOLIC PANEL
ALT: 18 U/L (ref 0–44)
AST: 17 U/L (ref 15–41)
Albumin: 4.3 g/dL (ref 3.5–5.0)
Alkaline Phosphatase: 60 U/L (ref 38–126)
Anion gap: 8 (ref 5–15)
BUN: 22 mg/dL — ABNORMAL HIGH (ref 6–20)
CO2: 27 mmol/L (ref 22–32)
Calcium: 9 mg/dL (ref 8.9–10.3)
Chloride: 103 mmol/L (ref 98–111)
Creatinine, Ser: 0.43 mg/dL — ABNORMAL LOW (ref 0.44–1.00)
GFR calc Af Amer: >60 mL/min (ref >60)
GFR calc non Af Amer: >60 mL/min (ref >60)
Glucose, Bld: 118 mg/dL — ABNORMAL HIGH (ref 70–99)
Potassium: 4.1 mmol/L (ref 3.5–5.1)
Sodium: 138 mmol/L (ref 135–145)
Total Bilirubin: 0.5 mg/dL (ref 0.3–1.2)
Total Protein: 7.7 g/dL (ref 6.5–8.1)

## 2018-10-28 LAB — URINALYSIS, MICROSCOPIC (REFLEX)

## 2018-10-28 LAB — URINALYSIS, ROUTINE W REFLEX MICROSCOPIC
Bilirubin Urine: NEGATIVE
Glucose, UA: NEGATIVE mg/dL
Ketones, ur: NEGATIVE mg/dL
Leukocytes,Ua: NEGATIVE
Nitrite: NEGATIVE
Protein, ur: NEGATIVE mg/dL
Specific Gravity, Urine: <1.005 — ABNORMAL LOW (ref 1.005–1.030)
pH: 6.5 (ref 5.0–8.0)

## 2018-10-28 MED ORDER — ONDANSETRON 8 MG PO TBDP: 8.0000 mg | ORAL_TABLET | Freq: Three times a day (TID) | ORAL | 0 refills | Status: DC | PRN

## 2018-10-28 MED ORDER — HYDROCODONE-ACETAMINOPHEN 5-325 MG PO TABS: 1.0000 | ORAL_TABLET | Freq: Three times a day (TID) | ORAL | 0 refills | Status: AC | PRN

## 2018-10-28 MED FILL — HYDROCODON-APAP 5-325: 5-325 | 2 days supply | Qty: 6 | Fill #0

## 2018-10-28 MED FILL — ONDANSETRON ODT 8 MG TABLET: 8 | 21 days supply | Qty: 18 | Fill #0

## 2018-10-28 NOTE — ED Provider Notes (Signed)
[facility] EMERGENCY DEPARTMENT Provider Note   CSN: [id number] Arrival date & time: 10/28/18  7829    History   Chief Complaint Chief Complaint  Patient presents with  . Flank Pain    HPI Tiffany Glover is a 58 y.o. female.     HPI  58 year old female comes in a chief complaint of right-sided flank pain.  Patient has history of kidney stones and cholecystectomy.  She reports that her flank pain has been going on for at least 2 weeks.  She has a constant dull pain with waxing and waning intensity that has gotten worse over the past few days.  She also has noted some blood in the urine.  Patient had a kidney stone in the past which caused similar pain, at that time she was able to pass the stone without needing any intervention.  Patient denies any history of ovarian cyst, vaginal discharge or bleeding.  Past Medical History:  Diagnosis Date  . Allergy   . Complication of anesthesia    hard to wake up once  . Hypertension   . Hypothyroidism    pt unaware of this and takes no medications   . Mild stress incontinence   . Morbid obesity with BMI of 40.0-44.9, adult (HCC)   . Obesity (BMI 30-39.9) 07/25/2012  . Ventral incisional hernia     Patient Active Problem List   Diagnosis Date Noted  . Asymptomatic microscopic hematuria 06/03/2018  . History of nephrolithiasis- not requiring surgery 06/03/2018  . Prediabetes 06/03/2018  . Chronic right-sided thoracic back pain-muscle spasms medial to right scapula and inferior to right scapula 03/18/2018  . Night muscle spasms-bilateral calves and hamstrings. 03/18/2018  . Glucose intolerance (impaired glucose tolerance) 03/18/2018  . Urine discoloration 03/18/2018  . Dehydration, mild 03/18/2018  . Chronic bilateral thoracic back pain 09/26/2017  . Myalgia 09/26/2017  . Plantar fascial fibromatosis of right foot 11/10/2016  . Muscle cramps in legs b/l 11/10/2016  . Salt craving 06/11/2016  . New Onset- Essential  hypertension 06/01/2016  . Bilateral impacted cerumen 06/01/2016  . Person with feared health complaint in whom no diagnosis is made 06/01/2016  . Seborrheic dermatitis- ears, scalp 06/01/2016  . Counseling on health promotion and disease prevention 06/01/2016  . Vitamin D insufficiency 03/26/2016  . History of laparoscopic cholecystectomy 02/25/2016  . History of smoking 30 pack years- Quit 9/ 1/ 96 02/25/2016  . Family history of suicide- father age 25 02/25/2016  . Family history of alcoholism in father; grandfathers etc 02/25/2016  . 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  . Personal history of noncompliance with medical treatment and regimen 02/25/2016  . Elevated blood pressure reading without diagnosis of hypertension 02/25/2016  . Incisional hernia 09/15/2014  . Incisional hernia, without obstruction or gangrene 08/08/2014  . Allergic rhinitis/ seasonal allergies 05/17/2014  . DUB (dysfunctional uterine bleeding) 05/17/2014  . HLD (hyperlipidemia) 05/17/2014  . Elevated fasting blood sugar 05/17/2014  . Class 2 obesity in adult 05/17/2014  . IFG (impaired fasting glucose) 05/17/2014  . Menopausal symptoms 05/17/2014  . Shoulder joint pain 05/17/2014  . (BMI >= 40) 09/14/2013  . h/o Hypothyroidism 09/04/2007  . h/o Hemorrhoids, internal, with bleeding 09/04/2007  . h/o Hemorrhoids, external 09/04/2007  . History of diverticulosis 09/04/2007    Past Surgical History:  Procedure Laterality Date  . CESAREAN SECTION    . CESAREAN SECTION    . CHOLECYSTECTOMY  07/21/2012   Procedure: LAPAROSCOPIC CHOLECYSTECTOMY WITH INTRAOPERATIVE CHOLANGIOGRAM;  Surgeon: Emelia Loron, MD;  Location: [facility] ORS;  Service: General;  Laterality: N/A;  attempted  . CHOLECYSTECTOMY  07/21/2012   Procedure: CHOLECYSTECTOMY;  Surgeon: Emelia Loron, MD;  Location: [facility] ORS;  Service: General;  Laterality: N/A;  . COLONOSCOPY  02/17/2007  . FOOT SURGERY     ganglion cyst removed from right foot   . HERNIA REPAIR  09/2014  . INCISIONAL HERNIA REPAIR N/A 09/15/2014   Procedure: LAPAROSCOPIC INCISIONAL HERNIA REPAIR WITH MESH;  Surgeon: Emelia Loron, MD;  Location: [facility] OR;  Service: General;  Laterality: N/A;  . vein strippiing     left leg     OB History   No obstetric history on file.      Home Medications    Prior to Admission medications   Medication Sig Start Date End Date Taking? Authorizing Provider  Cholecalciferol (VITAMIN D3) 5000 units TABS 5,000 IU OTC vitamin D3 daily. 07/11/16   Opalski, Gavin Pound, DO  cyclobenzaprine (FLEXERIL) 10 MG tablet Take 1 tablet (10 mg total) by mouth 3 (three) times daily as needed for muscle spasms. 03/18/18   Thomasene Lot, DO  HYDROcodone-acetaminophen (NORCO/VICODIN) 5-325 MG tablet Take 1 tablet by mouth every 8 (eight) hours as needed for up to 3 days for severe pain. 10/28/18 10/31/18  Derwood Kaplan, MD  Multiple Vitamin (MULTIVITAMIN) tablet Take 1 tablet by mouth daily.    [provider]  Multiple Vitamins-Minerals (ONE DAILY CALCIUM/IRON) TABS Take by mouth.    [provider]  ondansetron (ZOFRAN ODT) 8 MG disintegrating tablet Take 1 tablet (8 mg total) by mouth every 8 (eight) hours as needed for nausea. 10/28/18   Derwood Kaplan, MD  triamcinolone cream (KENALOG) 0.5 % Apply 1 application topically 2 (two) times daily. To affected areas. Patient taking differently: Apply 1 application topically 2 (two) times daily. To affected areas as needed 02/13/16   Thomasene Lot, DO    Family History Family History  Problem Relation Age of Onset  . Cancer Mother        Lung  . Suicidality Father   . Alcohol abuse Father   . Cancer Brother        mouth  . Cancer Maternal Grandmother   . Breast cancer Maternal Grandmother   . Alcohol abuse Maternal Grandfather   . Urolithiasis Son   . Hyperlipidemia Paternal Grandmother   . Hypertension Paternal Grandmother   . Alcohol abuse Paternal Grandfather   . Colon  cancer Neg Hx   . Colon polyps Neg Hx   . Esophageal cancer Neg Hx   . Rectal cancer Neg Hx   . Stomach cancer Neg Hx     Social History Social History   Tobacco Use  . Smoking status: Former Smoker    Packs/day: 1.50    Years: 20.00    Pack years: 30.00    Last attempt to quit: 03/02/1995    Years since quitting: 23.6  . Smokeless tobacco: Never Used  Substance Use Topics  . Alcohol use: Yes    Comment: Occassional Use  . Drug use: No     Allergies   Patient has no known allergies.   Review of Systems Review of Systems  Constitutional: Positive for activity change.  Respiratory: Negative for shortness of breath.   Cardiovascular: Negative for chest pain.  Gastrointestinal: Positive for nausea and vomiting.  Genitourinary: Positive for flank pain and hematuria. Negative for dysuria.     Physical Exam Updated Vital Signs BP 138/80   Pulse 76   Temp 98.3  F (36.8 C) (Oral)   Resp 16   Ht 5\' 4"  (1.626 m)   Wt 111.6 kg   LMP 09/08/2014 (Within Days)   SpO2 99%   BMI 42.23 kg/m   Physical Exam Vitals signs and nursing note reviewed.  Constitutional:      Appearance: She is well-developed.  HENT:     Head: Normocephalic and atraumatic.  Neck:     Musculoskeletal: Normal range of motion and neck supple.  Cardiovascular:     Rate and Rhythm: Normal rate.  Pulmonary:     Effort: Pulmonary effort is normal.  Abdominal:     General: Bowel sounds are normal. There is no distension.     Tenderness: There is right CVA tenderness. There is no guarding or rebound.     Comments: Right flank tenderness.   Skin:    General: Skin is warm and dry.  Neurological:     Mental Status: She is alert and oriented to person, place, and time.      ED Treatments / Results  Labs (all labs ordered are listed, but only abnormal results are displayed) Labs Reviewed  URINALYSIS, ROUTINE W REFLEX MICROSCOPIC - Abnormal; Notable for the following components:      Result  Value   Specific Gravity, Urine <1.005 (*)    Hgb urine dipstick MODERATE (*)    All other components within normal limits  URINALYSIS, MICROSCOPIC (REFLEX) - Abnormal; Notable for the following components:   Bacteria, UA RARE (*)    All other components within normal limits  COMPREHENSIVE METABOLIC PANEL - Abnormal; Notable for the following components:   Glucose, Bld 118 (*)    BUN 22 (*)    Creatinine, Ser 0.43 (*)    All other components within normal limits  CBC WITH DIFFERENTIAL/PLATELET    EKG None  Radiology Ct Renal Stone Study  Result Date: 10/28/2018 CLINICAL DATA:  RIGHT flank pain radiating to RIGHT lower quadrant for 2 weeks, hematuria EXAM: CT ABDOMEN AND PELVIS WITHOUT CONTRAST TECHNIQUE: Multidetector CT imaging of the abdomen and pelvis was performed following the standard protocol without IV contrast. Sagittal and coronal MPR images reconstructed from axial data set. Oral contrast was not administered. COMPARISON:  08/11/2014 FINDINGS: Lower chest: Few blebs and minimal scarring at lung bases Hepatobiliary: Gallbladder surgically absent.  Liver unremarkable. Pancreas: Normal appearance Spleen: Normal appearance Adrenals/Urinary Tract: Adrenal glands normal appearance. Large calculus at RIGHT renal pelvis 14 x 8 mm. Additional tiny calculus mid RIGHT kidney. Probable small LEFT peripelvic renal cysts. No hydronephrosis or additional renal mass. Nondilated ureters without calculi. Bladder unremarkable. Stomach/Bowel: Normal appendix. Diverticulosis of descending and sigmoid colon. Stomach and bowel loops otherwise normal appearance. Vascular/Lymphatic: Atherosclerotic calcifications aorta and iliac arteries without aneurysm. This includes significant atherosclerotic plaque at the origins of the celiac and superior mesenteric arteries, as well as the renal arteries. No adenopathy. Reproductive: Unremarkable uterus and adnexa Other: Tiny umbilical hernia containing fat. Previous  LEFT infraumbilical ventral hernia no longer seen. No free air or free fluid. Musculoskeletal: Osseous demineralization. Scattered degenerative disc disease changes. IMPRESSION: Two RIGHT renal calculi larger measuring 14 x 8 mm without hydronephrosis or ureteral dilatation. Distal colonic diverticulosis. Extensive atherosclerotic disease including multiple visceral artery origins. Electronically Signed   By: Ulyses SouthwardMark  Boles M.D.   On: 10/28/2018 10:44    Procedures Procedures (including critical care time)  Medications Ordered in ED Medications - No data to display   Initial Impression / Assessment and Plan / ED Course  I have reviewed the triage vital signs and the nursing notes.  Pertinent labs & imaging results that were available during my care of the patient were reviewed by me and considered in my medical decision making (see chart for details).        58 year old female comes in with chief complaint of right-sided flank pain.  She has known history of kidney stones in the past and is status post cholecystectomy.  She does not have any UTI-like symptoms nor does she have any fevers or chills.  There is no known history of pelvic disorder.  Based on our evaluation, differential diagnosis includes kidney stones and ureteral colic.  Pyelonephritis also considered in the differential diagnosis, but thought to be less likely.  CT scan noncontrast ordered given that patient's pain is been present for 2 weeks and getting worse.  If this was to be in normal caliber stone, she likely would have passed it by now.  CT scan revealed a 14 x 8 mm stone in the renal pelvis, which I think is the cause for her pain.  At this time she has her pain level at 4 out of 10, but she reports that her pain is much intense at nighttime.  I discussed the case with Dr. Alvester Morin, urology.  They are able to see patients in the clinic at this time and also performed procedures like lithotripsy.  He advised that patient is  requested to follow-up tomorrow.  Results from the ER workup discussed with the patient face to face and all questions answered to the best of my ability.   Final Clinical Impressions(s) / ED Diagnoses   Final diagnoses:  Nephrolithiasis    ED Discharge Orders         Ordered    HYDROcodone-acetaminophen (NORCO/VICODIN) 5-325 MG tablet  Every 8 hours PRN     10/28/18 1141    ondansetron (ZOFRAN ODT) 8 MG disintegrating tablet  Every 8 hours PRN     10/28/18 1141           Derwood Kaplan, MD 10/28/18 1209

## 2018-10-28 NOTE — Discharge Instructions (Addendum)
We saw you in the ER for the abdominal pain. Our results indicate that you have a kidney stone. We were able to get your pain is relative control, and we can safely send you home.  Take the meds prescribed. Set up an appointment with the Urologist tomorrow. If the pain is unbearable, you start having fevers, chills, and are unable to keep any meds down - then return to the ER.

## 2018-10-28 NOTE — ED Triage Notes (Signed)
Right flank pain radiating to RLQ x2 weeks.

## 2018-10-29 ENCOUNTER — Telehealth: Payer: Self-pay

## 2018-10-29 NOTE — Telephone Encounter (Signed)
Patient called 10/28/2018 in the morning with lower right abd pain and states that she knows that it is a kidney stone based xrays done at chiropractor.  Advised patient that for kidney stones she needs to go to the ER as they can do imaging same day and further eval need for medication or more. MPulliam, CMA/RT(R)

## 2018-10-30 ENCOUNTER — Encounter (HOSPITAL_COMMUNITY): Payer: Self-pay | Admitting: *Deleted

## 2018-10-30 ENCOUNTER — Other Ambulatory Visit: Payer: Self-pay | Admitting: Urology

## 2018-10-30 ENCOUNTER — Other Ambulatory Visit: Payer: Self-pay

## 2018-10-30 NOTE — Progress Notes (Signed)
Spoke w/ pt via phone for pre-op interview for lithotripsy on 11-02-2018.  Pt verbalized understanding to arrive at [facility] admitting at 1415 and to be npo after mn with exception clear liquids until 1000 then nothing by mouth including water, candy, gum, mints.  Pt aware to bring blue folder packet that received from [facility] and have all forms completed prior to arriving [place].  Advised pt to wear loose comfortable two piece clothing and no belt.  Pt aware no nsaids 48 hours before and no asa products 72 hours prior to surgery day.  To bring insurance card and photo ID.  Pt husband to pick up at discharge,  Tashira Rosebrock  # [phone].

## 2018-11-02 ENCOUNTER — Encounter (HOSPITAL_COMMUNITY): Payer: Self-pay | Admitting: General Practice

## 2018-11-02 ENCOUNTER — Encounter (HOSPITAL_COMMUNITY)
Admission: RE | Disposition: A | Discharge status: Home / Self Care | Payer: Self-pay | Source: Home / Self Care | Attending: Urology

## 2018-11-02 ENCOUNTER — Ambulatory Visit (HOSPITAL_COMMUNITY)
Admission: RE | Admit: 2018-11-02 | Discharge: 2018-11-02 | Disposition: A | Discharge status: Home / Self Care | Payer: BC Managed Care – PPO | Attending: Urology | Admitting: Urology

## 2018-11-02 ENCOUNTER — Ambulatory Visit (HOSPITAL_COMMUNITY): Payer: BC Managed Care – PPO

## 2018-11-02 DIAGNOSIS — E669 Obesity, unspecified: Secondary | ICD-10-CM | POA: Diagnosis not present

## 2018-11-02 DIAGNOSIS — Z6841 Body Mass Index (BMI) 40.0 and over, adult: Secondary | ICD-10-CM | POA: Insufficient documentation

## 2018-11-02 DIAGNOSIS — J45909 Unspecified asthma, uncomplicated: Secondary | ICD-10-CM | POA: Diagnosis not present

## 2018-11-02 DIAGNOSIS — I10 Essential (primary) hypertension: Secondary | ICD-10-CM | POA: Insufficient documentation

## 2018-11-02 DIAGNOSIS — N200 Calculus of kidney: Secondary | ICD-10-CM | POA: Diagnosis not present

## 2018-11-02 DIAGNOSIS — N201 Calculus of ureter: Secondary | ICD-10-CM

## 2018-11-02 HISTORY — DX: Diverticulosis of large intestine without perforation or abscess without bleeding: K57.30

## 2018-11-02 HISTORY — DX: Calculus of kidney: N20.0

## 2018-11-02 HISTORY — DX: Asymptomatic varicose veins of unspecified lower extremity: I83.90

## 2018-11-02 HISTORY — DX: Prediabetes: R73.03

## 2018-11-02 HISTORY — PX: EXTRACORPOREAL SHOCK WAVE LITHOTRIPSY: SHX1557

## 2018-11-02 HISTORY — DX: Personal history of urinary calculi: Z87.442

## 2018-11-02 HISTORY — DX: Dermatitis, unspecified: L30.9

## 2018-11-02 SURGERY — LITHOTRIPSY, ESWL
Anesthesia: LOCAL | Laterality: Right

## 2018-11-02 MED ORDER — DIAZEPAM 5 MG PO TABS
10.0000 mg | ORAL_TABLET | ORAL | Status: AC
  Administered 2018-11-02: 15:00:00 10 mg via ORAL
  Filled 2018-11-02: qty 2

## 2018-11-02 MED ORDER — SODIUM CHLORIDE 0.9 % IV SOLN
INTRAVENOUS | Status: DC
  Administered 2018-11-02: 15:00:00 via INTRAVENOUS

## 2018-11-02 MED ORDER — CIPROFLOXACIN HCL 500 MG PO TABS
500.0000 mg | ORAL_TABLET | ORAL | Status: AC
  Administered 2018-11-02: 500 mg via ORAL
  Filled 2018-11-02: qty 1

## 2018-11-02 MED ORDER — DIPHENHYDRAMINE HCL 25 MG PO CAPS
25.0000 mg | ORAL_CAPSULE | ORAL | Status: AC
  Administered 2018-11-02: 25 mg via ORAL
  Filled 2018-11-02: qty 1

## 2018-11-02 NOTE — H&P (Signed)
See scanned H&P

## 2018-11-03 ENCOUNTER — Encounter (HOSPITAL_COMMUNITY): Payer: Self-pay | Admitting: Urology

## 2019-08-16 ENCOUNTER — Telehealth: Payer: Self-pay | Admitting: Gastroenterology

## 2019-08-16 NOTE — Telephone Encounter (Signed)
The pt has a history of diverticulitis; several episodes over the past couple of years.  She was treated with those episodes with abx and responded well.  She began having pain this morning in the LLQ quadrant that is very similar to the pain she has had in the past.  Appt made with Gunnar Fusi for 2/16 at 1:30 pm.  The pt is aware to call or go to urgent care or ED if pain worsens or she develops other symptoms.  She did call PCP and was told she needs to follow up with GI.

## 2019-08-17 ENCOUNTER — Ambulatory Visit (INDEPENDENT_AMBULATORY_CARE_PROVIDER_SITE_OTHER): Payer: BC Managed Care – PPO | Admitting: Nurse Practitioner

## 2019-08-17 ENCOUNTER — Encounter: Payer: Self-pay | Admitting: Nurse Practitioner

## 2019-08-17 ENCOUNTER — Other Ambulatory Visit: Payer: Self-pay

## 2019-08-17 VITALS — HR 77 | Temp 97.8°F | Ht 64.0 in | Wt 254.0 lb

## 2019-08-17 DIAGNOSIS — R1032 Left lower quadrant pain: Secondary | ICD-10-CM | POA: Diagnosis not present

## 2019-08-17 DIAGNOSIS — K5792 Diverticulitis of intestine, part unspecified, without perforation or abscess without bleeding: Secondary | ICD-10-CM

## 2019-08-17 MED ORDER — CIPROFLOXACIN HCL 500 MG PO TABS: 500.0000 mg | ORAL_TABLET | Freq: Two times a day (BID) | ORAL | 0 refills | Status: AC

## 2019-08-17 MED ORDER — METRONIDAZOLE 500 MG PO TABS: 500.0000 mg | ORAL_TABLET | Freq: Three times a day (TID) | ORAL | 0 refills | Status: AC

## 2019-08-17 NOTE — Progress Notes (Signed)
IMPRESSION and PLAN:    # 59 yo female with stabbing LLQ pain / known diverticulosis.  -Suspect acute diverticulitis.  Ideally we would get CT scan to know for sure.  She has had CT scans through the years but for unrelated reasons. Patient just had lithotripsy, she is still trying to pay off that bill. Given this, and and fact that she is 3 days into episode I would like to go ahead and get her on antibiotics so will hold off on CT scan this time.  -Cipro/Flagyl x10 days,. -Clear liquids until feeling better -If symptoms do not resolve,  or certainly if they worsen, patient will call the office as then we will need to proceed with CT scan -Otherwise, in the future should patient have another episode then recommend CT scan to document presence of diverticulitis      HPI:    Primary GI: Oretha Caprice, MD       Chief complaint : abdominal pain   Tiffany Glover is a 59 y.o. female with history of diverticulosis.  Known to Dr. Ardis Hughs from screening colonoscopy in Sept 2018. She presents with LLQ pain with associated nausea . This is likely her third episode of presumed diverticulitis in the last 5 years.  Her last episode was approximately 4 years ago.  The first 2 times she was treated successfully with antibiotics.  Though she has had a few CT scans through the years, none were done for LLQ pain thus not showing diverticulitis.   Jaylinn awoke from sleep Saturday with severe, sharp LLQ pain.  She took Advil, applied heat and did get some relief.  Since then she has had intermittent stabbing LLQ pain she has some associated nausea.  Pain same as it was felt last 2 time she was empirically treated for diverticulitis.  Her bowel movements have been unchanged.  She has no urinary symptoms.  No fever..   Review of systems:     No chest pain, no SOB, no fevers, no urinary sx   Past Medical History:  Diagnosis Date  . 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 Complication of  anesthesia    hard to wake up once  . Diverticulosis of colon   . Eczema   . History of kidney stones   . Hypertension    10-30-2018 per pt has takes medication in past 6 months, pcp is following   . Pre-diabetes   . Varicose vein of leg     Patient's surgical history, family medical history, social history, medications and allergies were all reviewed in Epic   Creatinine clearance cannot be calculated (Patient's most recent lab result is older than the maximum 21 days allowed.)  Current Outpatient Medications  Medication Sig Dispense Refill  . Calcium Citrate-Vitamin D (CALCIUM + D PO) Take 1 tablet by mouth daily.    . Cholecalciferol (VITAMIN D3) 5000 units TABS 5,000 IU OTC vitamin D3 daily. (Patient taking differently: Take 1 tablet by mouth daily. 5,000 IU OTC vitamin D3 daily.) 90 tablet 3  . cyclobenzaprine (FLEXERIL) 10 MG tablet Take 1 tablet (10 mg total) by mouth 3 (three) times daily as needed for muscle spasms. 30 tablet 0  . Multiple Vitamin (MULTIVITAMIN) tablet Take 1 tablet by mouth daily.    Marland Kitchen triamcinolone cream (KENALOG) 0.5 % Apply 1 application topically 2 (two) times daily. To affected areas. (Patient taking differently: Apply 1 application topically 2 (  two) times daily as needed. To affected areas as needed) 60 g 1   No current facility-administered medications for this visit.    Physical Exam:     Temp 97.8 F (36.6 C)   Ht 5\' 4"  (1.626 m)   Wt 254 lb (115.2 kg)   LMP 09/08/2014 (Within Days)   BMI 43.60 kg/m   GENERAL:  Pleasant female in NAD PSYCH: : Cooperative, normal affect EENT:  conjunctiva pink, mucous membranes moist, neck supple without masses CARDIAC:  RRR,no murmur heard, no peripheral edema PULM: Normal respiratory effort, lungs CTA bilaterally, no wheezing ABDOMEN:  Nondistended, soft, mild-,mod LLQ tenderness . No obvious masses, no hepatomegaly,  normal bowel sounds SKIN:  turgor, no lesions seen NEURO: Alert and oriented x 3, no  focal neurologic deficits   11/08/2014 , NP 08/17/2019, 1:32 PM

## 2019-08-17 NOTE — Patient Instructions (Signed)
If you are age 60 or older, your body mass index should be between 23-30. Your Body mass index is 43.6 kg/m. If this is out of the aforementioned range listed, please consider follow up with your Primary Care Provider.  If you are age 74 or younger, your body mass index should be between 19-25. Your Body mass index is 43.6 kg/m. If this is out of the aformentioned range listed, please consider follow up with your Primary Care Provider.   Clear liquid diet until feeling better. Cipro 500 mg twice daily for 10 days. Flagyl 500 mg three times daily for 10 days.   Call with update after completing antibiotics. Ask to speak with [facility].

## 2019-08-18 NOTE — Progress Notes (Signed)
I agree with the above note, plan 

## 2019-08-30 ENCOUNTER — Telehealth: Payer: Self-pay | Admitting: Family Medicine

## 2019-08-30 DIAGNOSIS — J984 Other disorders of lung: Secondary | ICD-10-CM

## 2019-08-30 NOTE — Telephone Encounter (Signed)
Spoke with Dr. Sharee Holster- patient needs to keep apt to discuss BP and report concerning lungs.   Patient is aware of this and will keep apt. AS, CMA

## 2019-08-30 NOTE — Telephone Encounter (Signed)
CT W and W/O ordered for patient per Dr. 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 Patient aware CT ordered. AS, CMA

## 2019-08-30 NOTE — Telephone Encounter (Signed)
Patient called and scheduled a f/u for some concerns that was brought to her attention by urologist (mainly high BP and sleep apnea). She is scheduled for 09/08/19, but wants to discuss some items/concerns in the mean time with the nurse prior to the appt. Please contact patient when available.

## 2019-09-03 ENCOUNTER — Other Ambulatory Visit: Payer: Self-pay | Admitting: Family Medicine

## 2019-09-07 ENCOUNTER — Ambulatory Visit
Admission: RE | Admit: 2019-09-07 | Discharge: 2019-09-07 | Disposition: A | Discharge status: Home / Self Care | Payer: BC Managed Care – PPO | Source: Ambulatory Visit | Attending: Family Medicine | Admitting: Family Medicine

## 2019-09-07 DIAGNOSIS — J984 Other disorders of lung: Secondary | ICD-10-CM

## 2019-09-07 MED ORDER — IOPAMIDOL (ISOVUE-300) INJECTION 61%
75.0000 mL | Freq: Once | INTRAVENOUS | Status: AC | PRN
  Administered 2019-09-07: 75 mL via INTRAVENOUS

## 2019-09-08 ENCOUNTER — Ambulatory Visit (INDEPENDENT_AMBULATORY_CARE_PROVIDER_SITE_OTHER): Payer: BC Managed Care – PPO | Admitting: Family Medicine

## 2019-09-08 ENCOUNTER — Encounter: Payer: Self-pay | Admitting: Family Medicine

## 2019-09-08 ENCOUNTER — Other Ambulatory Visit: Payer: Self-pay

## 2019-09-08 VITALS — BP 149/81 | HR 82 | Temp 97.9°F | Resp 12 | Ht 64.0 in | Wt 250.5 lb

## 2019-09-08 DIAGNOSIS — R0683 Snoring: Secondary | ICD-10-CM

## 2019-09-08 DIAGNOSIS — R9389 Abnormal findings on diagnostic imaging of other specified body structures: Secondary | ICD-10-CM

## 2019-09-08 DIAGNOSIS — R400 Somnolence: Secondary | ICD-10-CM | POA: Diagnosis not present

## 2019-09-08 DIAGNOSIS — R0689 Other abnormalities of breathing: Secondary | ICD-10-CM

## 2019-09-08 DIAGNOSIS — G8929 Other chronic pain: Secondary | ICD-10-CM

## 2019-09-08 DIAGNOSIS — Z87891 Personal history of nicotine dependence: Secondary | ICD-10-CM

## 2019-09-08 DIAGNOSIS — M546 Pain in thoracic spine: Secondary | ICD-10-CM

## 2019-09-08 DIAGNOSIS — R252 Cramp and spasm: Secondary | ICD-10-CM

## 2019-09-08 DIAGNOSIS — R5381 Other malaise: Secondary | ICD-10-CM

## 2019-09-08 DIAGNOSIS — N200 Calculus of kidney: Secondary | ICD-10-CM | POA: Diagnosis not present

## 2019-09-08 MED ORDER — CYCLOBENZAPRINE HCL 10 MG PO TABS: 10.0000 mg | ORAL_TABLET | Freq: Three times a day (TID) | ORAL | 0 refills | Status: DC | PRN

## 2019-09-08 NOTE — Patient Instructions (Addendum)
Please realize, EXERCISE IS MEDICINE!  -  American Heart Association ( AHA) guidelines for exercise : If you are in good health, without any medical conditions, you should engage in 150-300 minutes of moderate intensity aerobic activity per week.  This means you should be huffing and puffing throughout your workout.   Engaging in regular exercise will improve brain function and memory, as well as improve mood, boost immune system and help with weight management.  As well as the other, more well-known effects of exercise such as decreasing blood sugar levels, decreasing blood pressure,  and decreasing bad cholesterol levels/ increasing good cholesterol levels.     -  The AHA strongly endorses consumption of a diet that contains a variety of foods from all the food categories with an emphasis on fruits and vegetables; fat-free and low-fat dairy products; cereal and grain products; legumes and nuts; and fish, poultry, and/or extra lean meats.    Excessive food intake, especially of foods high in saturated and trans fats, sugar, and salt, should be avoided.    Adequate water intake of roughly 1/2 of your weight in pounds, should equal the ounces of water per day you should drink.  So for instance, if you're 200 pounds, that would be 100 ounces of water per day.         Mediterranean Diet  Why follow it? Research shows. . Those who follow the Mediterranean diet have a reduced risk of heart disease  . The diet is associated with a reduced incidence of Parkinson's and Alzheimer's diseases . People following the diet may have longer life expectancies and lower rates of chronic diseases  . The Dietary Guidelines for Americans recommends the Mediterranean diet as an eating plan to promote health and prevent disease  What Is the Mediterranean Diet?  . Healthy eating plan based on typical foods and recipes of Mediterranean-style cooking . The diet is primarily a plant based diet; these foods should make up a  majority of meals   Starches - Plant based foods should make up a majority of meals - They are an important sources of vitamins, minerals, energy, antioxidants, and fiber - Choose whole grains, foods high in fiber and minimally processed items  - Typical grain sources include wheat, oats, barley, corn, Mode rice, bulgar, farro, millet, polenta, couscous  - Various types of beans include chickpeas, lentils, fava beans, black beans, white beans   Fruits  Veggies - Large quantities of antioxidant rich fruits & veggies; 6 or more servings  - Vegetables can be eaten raw or lightly drizzled with oil and cooked  - Vegetables common to the traditional Mediterranean Diet include: artichokes, arugula, beets, broccoli, brussel sprouts, cabbage, carrots, celery, collard greens, cucumbers, eggplant, kale, leeks, lemons, lettuce, mushrooms, okra, onions, peas, peppers, potatoes, pumpkin, radishes, rutabaga, shallots, spinach, sweet potatoes, turnips, zucchini - Fruits common to the Mediterranean Diet include: apples, apricots, avocados, cherries, clementines, dates, figs, grapefruits, grapes, melons, nectarines, oranges, peaches, pears, pomegranates, strawberries, tangerines  Fats - Replace butter and margarine with healthy oils, such as olive oil, canola oil, and tahini  - Limit nuts to no more than a handful a day  - Nuts include walnuts, almonds, pecans, pistachios, pine nuts  - Limit or avoid candied, honey roasted or heavily salted nuts - Olives are central to the Mediterranean diet - can be eaten whole or used in a variety of dishes   Meats Protein - Limiting red meat: no more than a few times a month -   When eating red meat: choose lean cuts and keep the portion to the size of deck of cards - Eggs: approx. 0 to 4 times a week  - Fish and lean poultry: at least 2 a week  - Healthy protein sources include, chicken, turkey, lean beef, lamb - Increase intake of seafood such as tuna, salmon, trout,  mackerel, shrimp, scallops - Avoid or limit high fat processed meats such as sausage and bacon  Dairy - Include moderate amounts of low fat dairy products  - Focus on healthy dairy such as fat free yogurt, skim milk, low or reduced fat cheese - Limit dairy products higher in fat such as whole or 2% milk, cheese, ice cream  Alcohol - Moderate amounts of red wine is ok  - No more than 5 oz daily for women (all ages) and men older than age 65  - No more than 10 oz of wine daily for men younger than 65  Other - Limit sweets and other desserts  - Use herbs and spices instead of salt to flavor foods  - Herbs and spices common to the traditional Mediterranean Diet include: basil, bay leaves, chives, cloves, cumin, fennel, garlic, lavender, marjoram, mint, oregano, parsley, pepper, rosemary, sage, savory, sumac, tarragon, thyme   It's not just a diet, it's a lifestyle:  . The Mediterranean diet includes lifestyle factors typical of those in the region  . Foods, drinks and meals are best eaten with others and savored . Daily physical activity is important for overall good health . This could be strenuous exercise like running and aerobics . This could also be more leisurely activities such as walking, housework, yard-work, or taking the stairs . Moderation is the key; a balanced and healthy diet accommodates most foods and drinks . Consider portion sizes and frequency of consumption of certain foods   Meal Ideas & Options:  . Breakfast:  o Whole wheat toast or whole wheat English muffins with peanut butter & hard boiled egg o Steel cut oats topped with apples & cinnamon and skim milk  o Fresh fruit: banana, strawberries, melon, berries, peaches  o Smoothies: strawberries, bananas, greek yogurt, peanut butter o Low fat greek yogurt with blueberries and granola  o Egg white omelet with spinach and mushrooms o Breakfast couscous: whole wheat couscous, apricots, skim milk, cranberries  . Sandwiches:   o Hummus and grilled vegetables (peppers, zucchini, squash) on whole wheat bread   o Grilled chicken on whole wheat pita with lettuce, tomatoes, cucumbers or tzatziki  o Tuna salad on whole wheat bread: tuna salad made with greek yogurt, olives, red peppers, capers, green onions o Garlic rosemary lamb pita: lamb sauted with garlic, rosemary, salt & pepper; add lettuce, cucumber, greek yogurt to pita - flavor with lemon juice and black pepper  . Seafood:  o Mediterranean grilled salmon, seasoned with garlic, basil, parsley, lemon juice and black pepper o Shrimp, lemon, and spinach whole-grain pasta salad made with low fat greek yogurt  o Seared scallops with lemon orzo  o Seared tuna steaks seasoned salt, pepper, coriander topped with tomato mixture of olives, tomatoes, olive oil, minced garlic, parsley, green onions and cappers  . Meats:  o Herbed greek chicken salad with kalamata olives, cucumber, feta  o Red bell peppers stuffed with spinach, bulgur, lean ground beef (or lentils) & topped with feta   o Kebabs: skewers of chicken, tomatoes, onions, zucchini, squash  o Turkey burgers: made with red onions, mint, dill, lemon juice, feta   cheese topped with roasted red peppers . Vegetarian o Cucumber salad: cucumbers, artichoke hearts, celery, red onion, feta cheese, tossed in olive oil & lemon juice  o Hummus and whole grain pita points with a greek salad (lettuce, tomato, feta, olives, cucumbers, red onion) o Lentil soup with celery, carrots made with vegetable broth, garlic, salt and pepper  o Tabouli salad: parsley, bulgur, mint, scallions, cucumbers, tomato, radishes, lemon juice, olive oil, salt and pepper.   Your goal blood pressure should be 135/85 or less on a regular basis, her medications should be started.  Normal blood pressure is 120/80 or less.    Hypertension Hypertension, commonly called high blood pressure, is when the force of blood pumping through the arteries is too  strong. The arteries are the blood vessels that carry blood from the heart throughout the body. Hypertension forces the heart to work harder to pump blood and may cause arteries to become narrow or stiff. Having untreated or uncontrolled hypertension can cause heart attacks, strokes, kidney disease, and other problems. A blood pressure reading consists of a higher number over a lower number. Ideally, your blood pressure should be below 120/80. The first ("top") number is called the systolic pressure. It is a measure of the pressure in your arteries as your heart beats. The second ("bottom") number is called the diastolic pressure. It is a measure of the pressure in your arteries as the heart relaxes. What are the causes? The cause of this condition is not known. What increases the risk? Some risk factors for high blood pressure are under your control. Others are not. Factors you can change  Smoking.  Having type 2 diabetes mellitus, high cholesterol, or both.  Not getting enough exercise or physical activity.  Being overweight.  Having too much fat, sugar, calories, or salt (sodium) in your diet.  Drinking too much alcohol. Factors that are difficult or impossible to change  Having chronic kidney disease.  Having a family history of high blood pressure.  Age. Risk increases with age.  Race. You may be at higher risk if you are African-American.  Gender. Men are at higher risk than women before age 76. After age 4, women are at higher risk than men.  Having obstructive sleep apnea.  Stress. What are the signs or symptoms? Extremely high blood pressure (hypertensive crisis) may cause:  Headache.  Anxiety.  Shortness of breath.  Nosebleed.  Nausea and vomiting.  Severe chest pain.  Jerky movements you cannot control (seizures).  How is this diagnosed? This condition is diagnosed by measuring your blood pressure while you are seated, with your arm resting on a  surface. The cuff of the blood pressure monitor will be placed directly against the skin of your upper arm at the level of your heart. It should be measured at least twice using the same arm. Certain conditions can cause a difference in blood pressure between your right and left arms. Certain factors can cause blood pressure readings to be lower or higher than normal (elevated) for a short period of time:  When your blood pressure is higher when you are in a health care provider's office than when you are at home, this is called white coat hypertension. Most people with this condition do not need medicines.  When your blood pressure is higher at home than when you are in a health care provider's office, this is called masked hypertension. Most people with this condition may need medicines to control blood pressure.  If  you have a high blood pressure reading during one visit or you have normal blood pressure with other risk factors:  You may be asked to return on a different day to have your blood pressure checked again.  You may be asked to monitor your blood pressure at home for 1 week or longer.  If you are diagnosed with hypertension, you may have other blood or imaging tests to help your health care provider understand your overall risk for other conditions. How is this treated? This condition is treated by making healthy lifestyle changes, such as eating healthy foods, exercising more, and reducing your alcohol intake. Your health care provider may prescribe medicine if lifestyle changes are not enough to get your blood pressure under control, and if:  Your systolic blood pressure is above 130.  Your diastolic blood pressure is above 80.  Your personal target blood pressure may vary depending on your medical conditions, your age, and other factors. Follow these instructions at home: Eating and drinking  Eat a diet that is high in fiber and potassium, and low in sodium, added sugar, and  fat. An example eating plan is called the DASH (Dietary Approaches to Stop Hypertension) diet. To eat this way: ? Eat plenty of fresh fruits and vegetables. Try to fill half of your plate at each meal with fruits and vegetables. ? Eat whole grains, such as whole wheat pasta, brown rice, or whole grain bread. Fill about one quarter of your plate with whole grains. ? Eat or drink low-fat dairy products, such as skim milk or low-fat yogurt. ? Avoid fatty cuts of meat, processed or cured meats, and poultry with skin. Fill about one quarter of your plate with lean proteins, such as fish, chicken without skin, beans, eggs, and tofu. ? Avoid premade and processed foods. These tend to be higher in sodium, added sugar, and fat.  Reduce your daily sodium intake. Most people with hypertension should eat less than 1,500 mg of sodium a day.  Limit alcohol intake to no more than 1 drink a day for nonpregnant women and 2 drinks a day for men. One drink equals 12 oz of beer, 5 oz of wine, or 1 oz of hard liquor. Lifestyle  Work with your health care provider to maintain a healthy body weight or to lose weight. Ask what an ideal weight is for you.  Get at least 30 minutes of exercise that causes your heart to beat faster (aerobic exercise) most days of the week. Activities may include walking, swimming, or biking.  Include exercise to strengthen your muscles (resistance exercise), such as pilates or lifting weights, as part of your weekly exercise routine. Try to do these types of exercises for 30 minutes at least 3 days a week.  Do not use any products that contain nicotine or tobacco, such as cigarettes and e-cigarettes. If you need help quitting, ask your health care provider.  Monitor your blood pressure at home as told by your health care provider.  Keep all follow-up visits as told by your health care provider. This is important. Medicines  Take over-the-counter and prescription medicines only as told  by your health care provider. Follow directions carefully. Blood pressure medicines must be taken as prescribed.  Do not skip doses of blood pressure medicine. Doing this puts you at risk for problems and can make the medicine less effective.  Ask your health care provider about side effects or reactions to medicines that you should watch for. Contact a  health care provider if:  You think you are having a reaction to a medicine you are taking.  You have headaches that keep coming back (recurring).  You feel dizzy.  You have swelling in your ankles.  You have trouble with your vision. Get help right away if:  You develop a severe headache or confusion.  You have unusual weakness or numbness.  You feel faint.  You have severe pain in your chest or abdomen.  You vomit repeatedly.  You have trouble breathing. Summary  Hypertension is when the force of blood pumping through your arteries is too strong. If this condition is not controlled, it may put you at risk for serious complications.  Your personal target blood pressure may vary depending on your medical conditions, your age, and other factors. For most people, a normal blood pressure is less than 120/80.  Hypertension is treated with lifestyle changes, medicines, or a combination of both. Lifestyle changes include weight loss, eating a healthy, low-sodium diet, exercising more, and limiting alcohol. This information is not intended to replace advice given to you by your health care provider. Make sure you discuss any questions you have with your health care provider. Document Released: 06/17/2005 Document Revised: 05/15/2016 Document Reviewed: 05/15/2016 Elsevier Interactive Patient Education  2018 [employer].    How to Take Your Blood Pressure   Blood pressure is a measurement of how strongly your blood is pressing against the walls of your arteries. Arteries are blood vessels that carry blood from your heart  throughout your body. Your health care provider takes your blood pressure at each office visit. You can also take your own blood pressure at home with a blood pressure machine. You may need to take your own blood pressure:  To confirm a diagnosis of high blood pressure (hypertension).  To monitor your blood pressure over time.  To make sure your blood pressure medicine is working.  Supplies needed: To take your blood pressure, you will need a blood pressure machine. You can buy a blood pressure machine, or blood pressure monitor, at most drugstores or online. There are several types of home blood pressure monitors. When choosing one, consider the following:  Choose a monitor that has an arm cuff.  Choose a monitor that wraps snugly around your upper arm. You should be able to fit only one finger between your arm and the cuff.  Do not choose a monitor that measures your blood pressure from your wrist or finger.  Your health care provider can suggest a reliable monitor that will meet your needs. How to prepare To get the most accurate reading, avoid the following for 30 minutes before you check your blood pressure:  Drinking caffeine.  Drinking alcohol.  Eating.  Smoking.  Exercising.  Five minutes before you check your blood pressure:  Empty your bladder.  Sit quietly without talking in a dining chair, rather than in a soft couch or armchair.  How to take your blood pressure To check your blood pressure, follow the instructions in the manual that came with your blood pressure monitor. If you have a digital blood pressure monitor, the instructions may be as follows: 1. Sit up straight. 2. Place your feet on the floor. Do not cross your ankles or legs. 3. Rest your left arm at the level of your heart on a table or desk or on the arm of a chair. 4. Pull up your shirt sleeve. 5. Wrap the blood pressure cuff around the upper part  of your left arm, 1 inch (2.5 cm) above your  elbow. It is best to wrap the cuff around bare skin. 6. Fit the cuff snugly around your arm. You should be able to place only one finger between the cuff and your arm. 7. Position the cord inside the groove of your elbow. 8. Press the power button. 9. Sit quietly while the cuff inflates and deflates. 10. Read the digital reading on the monitor screen and write it down (record it). 11. Wait 2-3 minutes, then repeat the steps, starting at step 1.  What does my blood pressure reading mean? A blood pressure reading consists of a higher number over a lower number. Ideally, your blood pressure should be below 120/80. The first ("top") number is called the systolic pressure. It is a measure of the pressure in your arteries as your heart beats. The second ("bottom") number is called the diastolic pressure. It is a measure of the pressure in your arteries as the heart relaxes. Blood pressure is classified into four stages. The following are the stages for adults who do not have a short-term serious illness or a chronic condition. Systolic pressure and diastolic pressure are measured in a unit called mm Hg. Normal  Systolic pressure: below 062.  Diastolic pressure: below 80. Elevated  Systolic pressure: 694-854.  Diastolic pressure: below 80. Hypertension stage 1  Systolic pressure: 627-035.  Diastolic pressure: 00-93. Hypertension stage 2  Systolic pressure: 818 or above.  Diastolic pressure: 90 or above. You can have prehypertension or hypertension even if only the systolic or only the diastolic number in your reading is higher than normal. Follow these instructions at home:  Check your blood pressure as often as recommended by your health care provider.  Take your monitor to the next appointment with your health care provider to make sure: ? That you are using it correctly. ? That it provides accurate readings.  Be sure you understand what your goal blood pressure numbers  are.  Tell your health care provider if you are having any side effects from blood pressure medicine. Contact a health care provider if:  Your blood pressure is consistently high. Get help right away if:  Your systolic blood pressure is higher than 180.  Your diastolic blood pressure is higher than 110. This information is not intended to replace advice given to you by your health care provider. Make sure you discuss any questions you have with your health care provider. Document Released: 11/24/2015 Document Revised: 02/06/2016 Document Reviewed: 11/24/2015 Elsevier Interactive Patient Education  [employer].

## 2019-09-08 NOTE — Progress Notes (Signed)
Impression and Recommendations:    1. Snorings   2. Breath-holding spell   3. Daytime sleepiness   4. Muscle cramps in legs b/l   5. Chronic bilateral thoracic back pain   6. Obesity >er 40 (HCC)   7. Inactivity     Snoring, Breath-Holding Spells, and Daytime Sleepiness - Reviewed patient's symptoms during appointment today. - STOP-BANG Questionnaire and Epworth Sleepiness scale administered. - Based on results of questionnaires, discussed need to send patient to sleep specialist today.  - Ambulatory referral provided today.  See orders.  - Education provided and all questions answered.  - Will continue to monitor alongside specialist.   Chronic Bilateral Thoracic Back Pain - Recent CT Renal Stone Study & CT Chest w/ Contrast - Discussed that a CT abdomen pelvis was ordered for further evaluation of patient's kidney stone and RLQ pain, obtained 08/27/2019 by urology, and additional CT Chest w/ Contrast ordered 09/07/2019 for further evaluation of lung findings found incidentally.   --> Discussed in detail results of BOTH patient's recent CT scans during appointment today; mutliple questions and concerns addressed.  - Advised patient that the findings of her most recent CT are consistent with structural abn most c/w emphysema. - Extensive education provided to patient today. - She is currently ASX for COPD, never has had sx but in poor C-V/ C-Pulm condition   Inactivity / physically deconditioned  - Encouraged patient to improve her cardiovascular and pulmonary fitness through prudent physical activity, diet and wt loss.   Advised patient that she will feel shortness of breath and winded when first beginning to work on her cardiovascular and pulmonary conditioning.  - Discussed if patient PREFERRED to be referred to pulmonology to discuss and address these findings/ her multiple concerns to which she declines. - I did not recommend pulm c/s at this time  CLINICAL DATA:   Pulmonary air cysts.  EXAM: CT CHEST WITH CONTRAST  TECHNIQUE: Multidetector CT imaging of the chest was performed during intravenous contrast administration.  CONTRAST:  75mL ISOVUE-300 IOPAMIDOL (ISOVUE-300) INJECTION 61%  COMPARISON:  CT abdomen from 10/20/2018 CT abdomen from 08/27/2019  FINDINGS: Cardiovascular: Atherosclerotic aortic arch.  Mediastinum/Nodes: Right lower paratracheal lymph node 0.8 cm in short axis on image 61/2. Right hilar lymph node 0.8 cm in short axis on image 68/2.  Lungs/Pleura: No pleural effusion or pneumothorax.  Scattered air cysts in the lungs of varying size. Some have perceptible wall, some of imperceptible wall. No specific sparing of the costophrenic angles. Mildly greater concentration in the lung bases. This is increased moderately since 2016. I am skeptical of lymphangiomyomatosis given that the patient is postmenopausal and given the non uniformity, as well as the lack of adenopathy, pleural effusion, or pneumothorax. The appearance is not characteristic of Langerhans cell histiocytosis. Lymphocytic interstitial pneumonitis is possible, but today's exam lacks the ground-glass opacities often encountered in the condition. Emphysema is certainly a differential diagnostic consideration given the patient's smoking history.  Upper Abdomen: Cholecystectomy.  Musculoskeletal: Thoracic spondylosis.  IMPRESSION: 1. Scattered air cysts in the lungs of varying sizes couple without adenopathy or pleural effusion, or ground-glass opacities. Attentive favor emphysema as a likely cause. Next most likely would be lymphocytic interstitial pneumonitis. The characteristics are not typical for Langerhans cell histiocytosis, and lymphangiomyomatosis is unlikely given the lack of corroborating features and the postmenopausal status of the patient. 2.  Aortic Atherosclerosis (ICD10-I70.0).  Aortic Atherosclerosis  (ICD10-I70.0)  Electronically Signed   By: Annitta NeedsWalter  Liebkemann M.D.  On: 09/08/2019 08:14  - Will continue to monitor alongside specialist.  Hypertension - Blood pressure currently is elevated. - Reviewed goal blood pressure with patient during appointment today, under 140/90.  - Counseled patient on pathophysiology of disease and discussed various treatment options, which always includes dietary and lifestyle modification as first line.   - Patient will begin with lifestyle modifications, such as dash and heart healthy diets and engaging in a regular exercise program.  If patient's blood pressure does not significantly improve in 4 months, will begin medication.  - Ambulatory blood pressure monitoring encouraged at least 3 times weekly.  Keep log and bring in every office visit.  Reminded patient that if she ever feels poorly in any way, to check her blood pressure and pulse.  - We will continue to monitor.  BMI Counseling - Body mass index is 43 kg/m Explained to patient what BMI refers to, and what it means medically.    Told patient to think about it as a "medical risk stratification measurement" and how increasing BMI is associated with increasing risk/ or worsening state of various diseases such as hypertension, hyperlipidemia, diabetes, premature OA, depression etc.  American Heart Association guidelines for healthy diet, basically Mediterranean diet, and exercise guidelines of 30 minutes 5 days per week or more discussed in detail.  Health counseling performed.  All questions answered.  Health Counseling & Preventative Maintenance - Advised patient to continue working toward exercising and prudent weight loss to improve overall mental, physical, and emotional health.    - Reviewed the "spokes of the wheel" of mood and health management.  Stressed the importance of ongoing prudent habits, including regular exercise, appropriate sleep hygiene, healthful dietary habits, and  prayer/meditation to relax.  - Encouraged patient to engage in daily physical activity as tolerated, especially a formal exercise routine.  Recommended that the patient eventually strive for at least 150 minutes of moderate cardiovascular activity per week according to guidelines established by the [facility].   - Healthy dietary habits encouraged, including low-carb, and high amounts of lean protein in diet.   - Patient should also consume adequate amounts of water.   Orders Placed This Encounter  Procedures   Ambulatory referral to Sleep Studies    Meds ordered this encounter  Medications   cyclobenzaprine (FLEXERIL) 10 MG tablet    Sig: Take 1 tablet (10 mg total) by mouth 3 (three) times daily as needed for muscle spasms.    Dispense:  30 tablet    Refill:  0    Medications Discontinued During This Encounter  Medication Reason   cyclobenzaprine (FLEXERIL) 10 MG tablet Reorder     Gross side effects, risk and benefits, and alternatives of medications and treatment plan in general discussed with patient.  Patient is aware that all medications have potential side effects and we are unable to predict every side effect or drug-drug interaction that may occur.   Patient will call with any questions prior to using medication if they have concerns.    Expresses verbal understanding and consents to current therapy and treatment regimen.  No barriers to understanding were identified.  Red flag symptoms and signs discussed in detail.  Patient expressed understanding regarding what to do in case of emergency\urgent symptoms  Please see AVS handed out to patient at the end of our visit for further patient instructions/ counseling done pertaining to today's office visit.   Return for f/up in 4 mo for BP re-check,PLUS CPE and full fasting blood work  same day in 51mo.     Note:  This note was prepared with assistance of Dragon voice recognition software. Occasional wrong-word or sound-a-like  substitutions may have occurred due to the inherent limitations of voice recognition software.   The 21st Century Cures Act was signed into law in 2016 which includes the topic of electronic health records.  This provides immediate access to information in MyChart.  This includes consultation notes, operative notes, office notes, lab results and pathology reports.  If you have any questions about what you read please let us know at your next visit or call us at the office.  We are right here with you.   This case required medical decision making of significant complexity. Multiple concerns of pt's was addressed today and multiple office visits , labs, CT results etc had to be reviewed and these were d/c pt and all Q's answered.  I spent 45+ minutes of face-to-face time with patient, with additional time being spent on medical charting/ review of medical records etc.  This time additionally included patient education and any time spent on pre-visit chart review, lab review, study review, order entry, electronic health record documentation.   This document serves as a record of services personally performed by Thomasene Lot, DO. It was created on her behalf by Peggye Fothergill, a trained medical scribe. The creation of this record is based on the scribe's personal observations and the provider's statements to them.   This case required medical decision making of at least moderate complexity. The above documentation from Peggye Fothergill, medical scribe, has been reviewed by Carlye Grippe, D.O.  -------------------------------------------------------------------------------------------------------------------------------------------------------------------------------------------------------------------------------------------- Subjective:     Tiffany Glover, am serving as scribe for Dr.Reshonda Koerber.   HPI: EARLEEN AOUN is a 59 y.o. female who presents to [facility]  at [facility] today for issues as discussed below.    Notes she hasn't been for routine care in a long while. Lost to follow up    - Nephrolithiasis s/p lithotripsy Says "last year, when COVID-19 hit, I started having issues with my kidneys.  Says she called over here and was referred to the ER.  "I went there and they did a CT scan and said that I had a kidney stone, it was 14 point something," and they set up an appointment with [facility].  She had lithotripsy procedure on September 02, 2018.   OSA SX during recent procedure She thinks she stopped breathing during this procedure; "I had a hard time with that procedure, because I kept waking up."  Notes the procedure also hurts.  Says she woke up twice, and "I think they were afraid to put me too far under, because I do have a tendency to take a while to wake up."  She was asked by the nurse anest. there if she'd ever been diagnosed with sleep apnea, but she was not sent for consult at that time.  She was told to follow up with her PCP.    Notes she called the clinic here and received an e-mail in MyChart, "but I don't check that often and missed that opportunity in past to be evaluated for this."       Medical compliance issues for chronic care:  --> Pt Says she was lost to follow-up with [place] because nobody called her to schedule an appointment and whenever she did call, she couldn';t get through.  Pt was last seen by [place] in 12 of 2019 for an  ACUTE issue with [place] and told to f/up in 3 mo for FBW and OV.  She's been battling with [the kidney stones], going back and forth, seeing Dr. Alvester Morin "pretty routinely."  Says she still has not passed all of the stone, had an X-ray done, and found some amount of units left.  Was told that they could follow it, or try the lithotripsy procedure again.  Patient says "I got hit real hard with out of pocket expenses, and I decided to hold off."   - Recent CT Chest w/Contrast Notes "it's come back to bite me,"  and Thursday or Friday two weeks ago, "[the urologist] could not figure out what was going on here."  Says "it did not look like it had moved," and a CT was ordered.  Says the imaging came back and "showed that [the stone] is moving, the kidney is not draining like it should be draining," and the patient meets with urology on Friday to discuss having the procedure again.  However, she is only here today b/c she was told by Urology to f/up with [place] for CT results- abn in lungs.  Per patient, "nothing showed up ( related to lung disease0 on my X-ray."  - History of Smoking She quit smoking in 1996 after smoking since age 39.  Believes she smoked until her 30's-40's, but she is not sure today.    --> Medical Hx  Has a 30 pack-year history reported to clinic by patient in the past.   - Currently- Denies difficulty breathing.  Says she only has difficulty "when I bend over to tie my shoe and try to take a deep breath."  This is nothing new for pt.    Morbid Obesity:  - Pt was 284 lbs when she first started coming to [place] in 8/17.    - Epworth sleepiness scale and STOP-BANG Questionnaire - positive She falls asleep while watching TV most of the time.  She does snore.  She denies feeling excessively tired during the day; "in fact my energy level is really good."  The nurse told the patient she stopped breathing twice during the lithotripsy procedure.  Patient notes that her blood pressure has been up, "but I've been in a lot of pain."  Her BMI is greater than 35, and her age is greater than 50.  Believes her neck is over 17inches  Says when she sits down, she "unwinds," which contributes to her sleepiness.  - Epworth Sleepiness Scale - 17 Watching a movie in public, a high chance of falling asleep Sitting for an hour as a passenger in a car, moderate chance of falling asleep. 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 Sitting and talking to husband or anyone else, does not fall asleep  very often, "because I"m engaged." Sitting quietly after lunch, unsure if she would fall asleep. Sitting in a car, stopped for a few minutes due to traffic: no chance of sleep.   -h/o Elevated Blood Pressure She has not been checking her blood pressure at home.  Says she's been in pain, and every time she's been in pain, her BP goes up.  Notes in doctors' offices, her BP is running 138-141 over 82-94.     Wt Readings from Last 3 Encounters:  09/08/19 250 lb 8 oz (113.6 kg)  08/17/19 254 lb (115.2 kg)  11/02/18 244 lb (110.7 kg)   BP Readings from Last 3 Encounters:  09/08/19 (!) 149/81  11/02/18 123/64  10/28/18 138/80  Pulse Readings from Last 3 Encounters:  09/08/19 82  08/17/19 77  11/02/18 77   BMI Readings from Last 3 Encounters:  09/08/19 43.00 kg/m  08/17/19 43.60 kg/m  11/02/18 41.88 kg/m     Patient Care Team    Relationship Specialty Notifications Start End  Mellody Dance, DO PCP - General Family Medicine  02/12/16   Lucas Mallow, MD Consulting Physician Urology  09/08/19      Patient Active Problem List   Diagnosis Date Noted   Salt craving 06/11/2016   New Onset- Essential hypertension 06/01/2016   History of smoking 30 pack years- Quit 9/ 1/ 96 02/25/2016   HLD (hyperlipidemia) 05/17/2014   Elevated fasting blood sugar 05/17/2014   Morbid obesity ([place]) 09/14/2013   Plantar fascial fibromatosis of right foot 11/10/2016   Personal history of noncompliance with medical treatment and regimen 02/25/2016   Class 2 obesity in adult 05/17/2014   h/o Hypothyroidism 09/04/2007   Muscle cramps in legs b/l 11/10/2016   Bilateral impacted cerumen 06/01/2016   Seborrheic dermatitis- ears, scalp 06/01/2016   Counseling on health promotion and disease prevention 06/01/2016   Vitamin D insufficiency 03/26/2016   History of laparoscopic cholecystectomy 02/25/2016   Family history of suicide- father age 49 02/25/2016   Family history  of alcoholism in father; grandfathers etc 02/25/2016   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   Allergic rhinitis/ seasonal allergies 05/17/2014   h/o Hemorrhoids, internal, with bleeding 09/04/2007   h/o Hemorrhoids, external 09/04/2007   History of diverticulosis 09/04/2007   Kidney stone 09/08/2019   Asymptomatic microscopic hematuria 06/03/2018   History of nephrolithiasis- not requiring surgery 06/03/2018   Prediabetes 06/03/2018   Chronic right-sided thoracic back pain-muscle spasms medial to right scapula and inferior to right scapula 03/18/2018   Night muscle spasms-bilateral calves and hamstrings. 03/18/2018   Glucose intolerance (impaired glucose tolerance) 03/18/2018   Urine discoloration 03/18/2018   Dehydration, mild 03/18/2018   Chronic bilateral thoracic back pain 09/26/2017   Myalgia 09/26/2017   Person with feared health complaint in whom no diagnosis is made 06/01/2016   Elevated blood pressure reading without diagnosis of hypertension 02/25/2016   Incisional hernia 09/15/2014   Incisional hernia, without obstruction or gangrene 08/08/2014   DUB (dysfunctional uterine bleeding) 05/17/2014   IFG (impaired fasting glucose) 05/17/2014   Menopausal symptoms 05/17/2014   Shoulder joint pain 05/17/2014    Past Medical history, Surgical history, Family history, Social history, Allergies and Medications have been entered into the medical record, reviewed and changed as needed.    Current Meds  Medication Sig   Calcium Citrate-Vitamin D (CALCIUM + D PO) Take 1 tablet by mouth daily.   Cholecalciferol (VITAMIN D3) 5000 units TABS 5,000 IU OTC vitamin D3 daily. (Patient taking differently: Take 1 tablet by mouth daily. 5,000 IU OTC vitamin D3 daily.)   cyclobenzaprine (FLEXERIL) 10 MG tablet Take 1 tablet (10 mg total) by mouth 3 (three) times daily as needed for muscle spasms.   Multiple Vitamin (MULTIVITAMIN) tablet Take 1 tablet by mouth daily.    triamcinolone cream (KENALOG) 0.5 % Apply 1 application topically 2 (two) times daily. To affected areas. (Patient taking differently: Apply 1 application topically 2 (two) times daily as needed. To affected areas as needed)   [DISCONTINUED] cyclobenzaprine (FLEXERIL) 10 MG tablet Take 1 tablet (10 mg total) by mouth 3 (three) times daily as needed for muscle spasms.    Allergies:  No Known Allergies   Review of Systems:  A  fourteen system review of systems was performed and found to be positive as per HPI.   Objective:   Blood pressure (!) 149/81, pulse 82, temperature 97.9 F (36.6 C), temperature source Oral, resp. rate 12, height 5\' 4"  (1.626 m), weight 250 lb 8 oz (113.6 kg), last menstrual period 09/08/2014, SpO2 100 %. Body mass index is 43 kg/m. General:  Well Developed, well nourished, appropriate for stated age.  Neuro:  Alert and oriented,  extra-ocular muscles intact  HEENT:  Normocephalic, atraumatic, neck supple, no carotid bruits appreciated  Skin:  no gross rash, warm, pink. Cardiac:  RRR, S1 S2 Respiratory:  ECTA B/L and A/P, Not using accessory muscles, speaking in full sentences- unlabored. Vascular:  Ext warm, no cyanosis apprec.; cap RF less 2 sec. Psych:  No HI/SI, judgement and insight good, Euthymic mood. Full Affect.

## 2019-09-11 DIAGNOSIS — R5381 Other malaise: Secondary | ICD-10-CM | POA: Insufficient documentation

## 2019-09-11 DIAGNOSIS — R9389 Abnormal findings on diagnostic imaging of other specified body structures: Secondary | ICD-10-CM | POA: Insufficient documentation

## 2019-09-17 ENCOUNTER — Other Ambulatory Visit: Payer: Self-pay | Admitting: Urology

## 2019-10-05 ENCOUNTER — Encounter (HOSPITAL_BASED_OUTPATIENT_CLINIC_OR_DEPARTMENT_OTHER): Payer: Self-pay | Admitting: Urology

## 2019-10-06 ENCOUNTER — Other Ambulatory Visit: Payer: Self-pay

## 2019-10-06 ENCOUNTER — Encounter (HOSPITAL_BASED_OUTPATIENT_CLINIC_OR_DEPARTMENT_OTHER): Payer: Self-pay | Admitting: Urology

## 2019-10-06 NOTE — Progress Notes (Signed)
Spoke w/ via phone for pre-op interview--- PT Lab needs dos---- Istat 8 and EKG              Lab results------ no COVID test ------ 10-11-2019 @ 0840 Arrive at ------- 0745 NPO after ------ MN Medications to take morning of surgery ----- NONE Diabetic medication ----- n/a Patient Special Instructions ----- n/a Pre-Op special Istructions ----- n/a Patient verbalized understanding of instructions that were given at this phone interview. Patient denies shortness of breath, chest pain, fever, cough a this phone interview.

## 2019-10-11 ENCOUNTER — Other Ambulatory Visit (HOSPITAL_COMMUNITY)
Admission: RE | Admit: 2019-10-11 | Discharge: 2019-10-11 | Disposition: A | Discharge status: Home / Self Care | Payer: BC Managed Care – PPO | Source: Ambulatory Visit | Attending: Urology | Admitting: Urology

## 2019-10-11 DIAGNOSIS — Z20822 Contact with and (suspected) exposure to covid-19: Secondary | ICD-10-CM | POA: Diagnosis not present

## 2019-10-11 DIAGNOSIS — Z01812 Encounter for preprocedural laboratory examination: Secondary | ICD-10-CM | POA: Diagnosis present

## 2019-10-11 LAB — SARS CORONAVIRUS 2 (TAT 6-24 HRS): SARS Coronavirus 2: NEGATIVE

## 2019-10-13 ENCOUNTER — Encounter (HOSPITAL_BASED_OUTPATIENT_CLINIC_OR_DEPARTMENT_OTHER): Payer: Self-pay | Admitting: Urology

## 2019-10-13 ENCOUNTER — Other Ambulatory Visit: Payer: Self-pay

## 2019-10-13 ENCOUNTER — Ambulatory Visit (HOSPITAL_BASED_OUTPATIENT_CLINIC_OR_DEPARTMENT_OTHER): Payer: BC Managed Care – PPO | Admitting: Anesthesiology

## 2019-10-13 ENCOUNTER — Encounter (HOSPITAL_BASED_OUTPATIENT_CLINIC_OR_DEPARTMENT_OTHER)
Admission: RE | Disposition: A | Discharge status: Home / Self Care | Payer: Self-pay | Source: Home / Self Care | Attending: Urology

## 2019-10-13 ENCOUNTER — Ambulatory Visit (HOSPITAL_BASED_OUTPATIENT_CLINIC_OR_DEPARTMENT_OTHER)
Admission: RE | Admit: 2019-10-13 | Discharge: 2019-10-13 | Disposition: A | Discharge status: Home / Self Care | Payer: BC Managed Care – PPO | Attending: Urology | Admitting: Urology

## 2019-10-13 DIAGNOSIS — Z87891 Personal history of nicotine dependence: Secondary | ICD-10-CM | POA: Diagnosis not present

## 2019-10-13 DIAGNOSIS — E7800 Pure hypercholesterolemia, unspecified: Secondary | ICD-10-CM | POA: Diagnosis not present

## 2019-10-13 DIAGNOSIS — Z87442 Personal history of urinary calculi: Secondary | ICD-10-CM | POA: Diagnosis not present

## 2019-10-13 DIAGNOSIS — R9431 Abnormal electrocardiogram [ECG] [EKG]: Secondary | ICD-10-CM | POA: Diagnosis not present

## 2019-10-13 DIAGNOSIS — I10 Essential (primary) hypertension: Secondary | ICD-10-CM | POA: Insufficient documentation

## 2019-10-13 DIAGNOSIS — Z841 Family history of disorders of kidney and ureter: Secondary | ICD-10-CM | POA: Diagnosis not present

## 2019-10-13 DIAGNOSIS — Z818 Family history of other mental and behavioral disorders: Secondary | ICD-10-CM | POA: Diagnosis not present

## 2019-10-13 DIAGNOSIS — E039 Hypothyroidism, unspecified: Secondary | ICD-10-CM | POA: Insufficient documentation

## 2019-10-13 DIAGNOSIS — Z809 Family history of malignant neoplasm, unspecified: Secondary | ICD-10-CM | POA: Diagnosis not present

## 2019-10-13 DIAGNOSIS — Z79899 Other long term (current) drug therapy: Secondary | ICD-10-CM | POA: Insufficient documentation

## 2019-10-13 DIAGNOSIS — N201 Calculus of ureter: Secondary | ICD-10-CM | POA: Diagnosis present

## 2019-10-13 DIAGNOSIS — Z791 Long term (current) use of non-steroidal anti-inflammatories (NSAID): Secondary | ICD-10-CM | POA: Insufficient documentation

## 2019-10-13 DIAGNOSIS — R7303 Prediabetes: Secondary | ICD-10-CM | POA: Insufficient documentation

## 2019-10-13 DIAGNOSIS — Z9049 Acquired absence of other specified parts of digestive tract: Secondary | ICD-10-CM | POA: Diagnosis not present

## 2019-10-13 HISTORY — DX: Personal history of other endocrine, nutritional and metabolic disease: Z86.39

## 2019-10-13 HISTORY — DX: Vitamin D deficiency, unspecified: E55.9

## 2019-10-13 HISTORY — DX: Calculus of kidney: N20.0

## 2019-10-13 HISTORY — DX: Frequency of micturition: R35.0

## 2019-10-13 HISTORY — DX: Urgency of urination: R39.15

## 2019-10-13 HISTORY — PX: CYSTOSCOPY/URETEROSCOPY/HOLMIUM LASER/STENT PLACEMENT: SHX6546

## 2019-10-13 HISTORY — DX: Mixed hyperlipidemia: E78.2

## 2019-10-13 LAB — POCT I-STAT, CHEM 8
BUN: 16 mg/dL (ref 6–20)
Calcium, Ion: 1.26 mmol/L (ref 1.15–1.40)
Chloride: 104 mmol/L (ref 98–111)
Creatinine, Ser: 0.5 mg/dL (ref 0.44–1.00)
Glucose, Bld: 111 mg/dL — ABNORMAL HIGH (ref 70–99)
HCT: 41 % (ref 36.0–46.0)
Hemoglobin: 13.9 g/dL (ref 12.0–15.0)
Potassium: 3.9 mmol/L (ref 3.5–5.1)
Sodium: 142 mmol/L (ref 135–145)
TCO2: 27 mmol/L (ref 22–32)

## 2019-10-13 SURGERY — CYSTOSCOPY/URETEROSCOPY/HOLMIUM LASER/STENT PLACEMENT
Anesthesia: General | Site: Ureter | Laterality: Right

## 2019-10-13 MED ORDER — LIDOCAINE 2% (20 MG/ML) 5 ML SYRINGE
INTRAMUSCULAR | Status: DC | PRN
  Administered 2019-10-13: 80 mg via INTRAVENOUS

## 2019-10-13 MED ORDER — PROMETHAZINE HCL 25 MG/ML IJ SOLN
6.2500 mg | INTRAMUSCULAR | Status: DC | PRN
  Filled 2019-10-13: qty 1

## 2019-10-13 MED ORDER — HYDROMORPHONE HCL 1 MG/ML IJ SOLN
0.2500 mg | INTRAMUSCULAR | Status: DC | PRN
  Filled 2019-10-13: qty 0.5

## 2019-10-13 MED ORDER — ACETAMINOPHEN 500 MG PO TABS
ORAL_TABLET | ORAL | Status: AC
  Filled 2019-10-13: qty 2

## 2019-10-13 MED ORDER — PROPOFOL 10 MG/ML IV BOLUS
INTRAVENOUS | Status: AC
  Filled 2019-10-13: qty 20

## 2019-10-13 MED ORDER — DEXAMETHASONE SODIUM PHOSPHATE 10 MG/ML IJ SOLN
INTRAMUSCULAR | Status: AC
  Filled 2019-10-13: qty 1

## 2019-10-13 MED ORDER — FENTANYL CITRATE (PF) 100 MCG/2ML IJ SOLN
INTRAMUSCULAR | Status: AC
  Filled 2019-10-13: qty 2

## 2019-10-13 MED ORDER — CEFAZOLIN SODIUM-DEXTROSE 2-4 GM/100ML-% IV SOLN
2.0000 g | INTRAVENOUS | Status: AC
  Administered 2019-10-13: 2 g via INTRAVENOUS
  Filled 2019-10-13: qty 100

## 2019-10-13 MED ORDER — LIDOCAINE 2% (20 MG/ML) 5 ML SYRINGE
INTRAMUSCULAR | Status: AC
  Filled 2019-10-13: qty 5

## 2019-10-13 MED ORDER — LACTATED RINGERS IV SOLN
INTRAVENOUS | Status: DC
  Filled 2019-10-13: qty 1000

## 2019-10-13 MED ORDER — CEFAZOLIN SODIUM-DEXTROSE 2-4 GM/100ML-% IV SOLN
INTRAVENOUS | Status: AC
  Filled 2019-10-13: qty 100

## 2019-10-13 MED ORDER — FAMOTIDINE 20 MG PO TABS
20.0000 mg | ORAL_TABLET | Freq: Once | ORAL | Status: AC
  Administered 2019-10-13: 20 mg via ORAL
  Filled 2019-10-13: qty 1

## 2019-10-13 MED ORDER — PROPOFOL 10 MG/ML IV BOLUS
INTRAVENOUS | Status: DC | PRN
  Administered 2019-10-13: 180 mg via INTRAVENOUS

## 2019-10-13 MED ORDER — SODIUM CHLORIDE 0.9 % IR SOLN
Status: DC | PRN
  Administered 2019-10-13: 3000 mL via INTRAVESICAL

## 2019-10-13 MED ORDER — KETOROLAC TROMETHAMINE 30 MG/ML IJ SOLN
30.0000 mg | Freq: Once | INTRAMUSCULAR | Status: DC | PRN
  Filled 2019-10-13: qty 1

## 2019-10-13 MED ORDER — ONDANSETRON HCL 4 MG/2ML IJ SOLN
INTRAMUSCULAR | Status: AC
  Filled 2019-10-13: qty 2

## 2019-10-13 MED ORDER — FENTANYL CITRATE (PF) 100 MCG/2ML IJ SOLN
INTRAMUSCULAR | Status: DC | PRN
  Administered 2019-10-13 (×2): 50 ug via INTRAVENOUS

## 2019-10-13 MED ORDER — ACETAMINOPHEN 500 MG PO TABS
1000.0000 mg | ORAL_TABLET | Freq: Once | ORAL | Status: AC
  Administered 2019-10-13: 1000 mg via ORAL
  Filled 2019-10-13: qty 2

## 2019-10-13 MED ORDER — HYDROCODONE-ACETAMINOPHEN 5-325 MG PO TABS: 1.0000 | ORAL_TABLET | ORAL | 0 refills | Status: DC | PRN

## 2019-10-13 MED ORDER — FAMOTIDINE 20 MG PO TABS
ORAL_TABLET | ORAL | Status: AC
  Filled 2019-10-13: qty 1

## 2019-10-13 MED ORDER — MIDAZOLAM HCL 2 MG/2ML IJ SOLN
INTRAMUSCULAR | Status: AC
  Filled 2019-10-13: qty 2

## 2019-10-13 MED ORDER — IOHEXOL 300 MG/ML  SOLN
INTRAMUSCULAR | Status: DC | PRN
  Administered 2019-10-13: 10:00:00 8 mL via URETHRAL

## 2019-10-13 MED ORDER — MIDAZOLAM HCL 5 MG/5ML IJ SOLN
INTRAMUSCULAR | Status: DC | PRN
  Administered 2019-10-13: 2 mg via INTRAVENOUS

## 2019-10-13 MED ORDER — DEXAMETHASONE SODIUM PHOSPHATE 10 MG/ML IJ SOLN
INTRAMUSCULAR | Status: DC | PRN
  Administered 2019-10-13: 10 mg via INTRAVENOUS

## 2019-10-13 MED ORDER — OXYCODONE HCL 5 MG/5ML PO SOLN
5.0000 mg | Freq: Once | ORAL | Status: AC | PRN
  Filled 2019-10-13: qty 5

## 2019-10-13 MED ORDER — OXYCODONE HCL 5 MG PO TABS
5.0000 mg | ORAL_TABLET | Freq: Once | ORAL | Status: AC | PRN
  Administered 2019-10-13: 5 mg via ORAL
  Filled 2019-10-13: qty 1

## 2019-10-13 MED ORDER — ONDANSETRON HCL 4 MG/2ML IJ SOLN
INTRAMUSCULAR | Status: DC | PRN
  Administered 2019-10-13: 4 mg via INTRAVENOUS

## 2019-10-13 MED ORDER — OXYCODONE HCL 5 MG PO TABS
ORAL_TABLET | ORAL | Status: AC
  Filled 2019-10-13: qty 1

## 2019-10-13 SURGICAL SUPPLY — 21 items
BAG DRAIN URO-CYSTO SKYTR STRL (DRAIN) ×3 IMPLANT
BASKET ZERO TIP NITINOL 2.4FR (BASKET) ×3 IMPLANT
CATH INTERMIT  6FR 70CM (CATHETERS) ×3 IMPLANT
CLOTH BEACON ORANGE TIMEOUT ST (SAFETY) ×3 IMPLANT
FIBER LASER FLEXIVA 365 (UROLOGICAL SUPPLIES) IMPLANT
FIBER LASER TRAC TIP (UROLOGICAL SUPPLIES) ×3 IMPLANT
GLOVE BIO SURGEON STRL SZ7.5 (GLOVE) ×6 IMPLANT
GOWN STRL REUS W/TWL XL LVL3 (GOWN DISPOSABLE) ×6 IMPLANT
GUIDEWIRE STR DUAL SENSOR (WIRE) ×6 IMPLANT
IV NS 1000ML (IV SOLUTION) ×3
IV NS 1000ML BAXH (IV SOLUTION) ×1 IMPLANT
IV NS IRRIG 3000ML ARTHROMATIC (IV SOLUTION) ×3 IMPLANT
KIT TURNOVER CYSTO (KITS) ×3 IMPLANT
MANIFOLD NEPTUNE II (INSTRUMENTS) ×3 IMPLANT
NS IRRIG 500ML POUR BTL (IV SOLUTION) ×3 IMPLANT
PACK CYSTO (CUSTOM PROCEDURE TRAY) ×3 IMPLANT
SHEATH URETERAL 12FRX35CM (MISCELLANEOUS) ×3 IMPLANT
STENT URET 6FRX24 CONTOUR (STENTS) ×3 IMPLANT
TUBE CONNECTING 12'X1/4 (SUCTIONS) ×1
TUBE CONNECTING 12X1/4 (SUCTIONS) ×2 IMPLANT
TUBING UROLOGY SET (TUBING) ×3 IMPLANT

## 2019-10-13 NOTE — Transfer of Care (Signed)
Immediate Anesthesia Transfer of Care Note  Patient: Tiffany Glover  Procedure(s) Performed: CYSTOSCOPY RIGHT RETROGRADE PYELOGRAM URETEROSCOPY/HOLMIUM LASER/STENT PLACEMENT (Right Ureter)  Patient Location: PACU  Anesthesia Type:General  Level of Consciousness: awake, alert  and oriented  Airway & Oxygen Therapy: Patient Spontanous Breathing and Patient connected to nasal cannula oxygen  Post-op Assessment: Report given to RN and Post -op Vital signs reviewed and stable  Post vital signs: Reviewed and stable  Last Vitals:  Vitals Value Taken Time  BP    Temp    Pulse 73 10/13/19 1046  Resp 15 10/13/19 1046  SpO2 98 % 10/13/19 1046  Vitals shown include unvalidated device data.  Last Pain:  Vitals:   10/13/19 0818  TempSrc: Oral  PainSc: 0-No pain      Patients Stated Pain Goal: 4 (10/13/19 0818)  Complications: No apparent anesthesia complications

## 2019-10-13 NOTE — Anesthesia Preprocedure Evaluation (Addendum)
Anesthesia Evaluation  Patient identified by MRN, date of birth, ID band Patient awake    Reviewed: Allergy & Precautions, NPO status , Patient's Chart, lab work & pertinent test results  Airway Mallampati: III  TM Distance: >3 FB Neck ROM: Full    Dental no notable dental hx.    Pulmonary former smoker,    Pulmonary exam normal breath sounds clear to auscultation       Cardiovascular hypertension, Normal cardiovascular exam Rhythm:Regular Rate:Normal     Neuro/Psych negative neurological ROS  negative psych ROS   GI/Hepatic negative GI ROS, Neg liver ROS,   Endo/Other  Hypothyroidism Morbid obesityPre-DM  Renal/GU negative Renal ROS     Musculoskeletal negative musculoskeletal ROS (+)   Abdominal   Peds  Hematology negative hematology ROS (+)   Anesthesia Other Findings RIGHT RENAL PELVIC STONE  Reproductive/Obstetrics                            Anesthesia Physical Anesthesia Plan  ASA: III  Anesthesia Plan: General   Post-op Pain Management:    Induction: Intravenous  PONV Risk Score and Plan: 4 or greater and Midazolam, Dexamethasone, Ondansetron and Treatment may vary due to age or medical condition  Airway Management Planned: LMA  Additional Equipment:   Intra-op Plan:   Post-operative Plan: Extubation in OR  Informed Consent: I have reviewed the patients History and Physical, chart, labs and discussed the procedure including the risks, benefits and alternatives for the proposed anesthesia with the patient or authorized representative who has indicated his/her understanding and acceptance.     Dental advisory given  Plan Discussed with: CRNA  Anesthesia Plan Comments:        Anesthesia Quick Evaluation

## 2019-10-13 NOTE — H&P (Signed)
CC/HPI: CC: Right ureteropelvic junction calculus.  HPI:  10/29/2018  59 year old female who experienced 2 weeks of right-sided flank pain. She underwent a CT scan of the abdomen and pelvis without contrast revealed a 1.4 cm right ureteropelvic junction calculus. Otherwise, she had a right tiny punctate calculus. There is no hydronephrosis. Creatinine was 0.43. No leukocytosis. Urinalysis was negative yesterday. She continues to have intermittent right-sided flank pain. She had a stone several years ago that she passed spontaneously. She has never required a procedure. Stone was visible on [facility] imaging.   02/18/2019  Patient status post ESWL about 3 months ago. Stones were calcium oxalate. She has a persistent about 5 mm, nonobstructing renal calculus on KUB. She has had no pain. She does have microscopic hematuria, 3-10 red blood cell per high-power field today. She however has no complaints.   06/10/2019  Patient underwent a KUB today. This revealed stable right renal calculus. She has occasional soreness in her right lower back likely musculoskeletal in nature. She denies hematuria or dysuria.   08/27/19: Patient with above noted hx. She presents today with acute onset right flank and RLQ pain, which began 2-3 days ago and continues to progress. She has been using Tylenol and Ibuprofen for pain management, which keep pain manageable. However, she continues to have discomfort. She states that her symptoms today are not necessarily similar to past stone events. She does have some increased frequency, but was concerned about stone event so she increased her fluid intake. She denies dysuria or gross hematuria. No complaints of fevers or chills. She does have some nausea, but denies vomiting. No changes in bowel habits. She does have past hx of diverticulitis and was treated by GI last week with two antibiotics, which she completed Tuesday.   09/10/19: Patient with above-noted history. She returns today for  follow-up. CT imaging at prior office visit revealed an approximately 6 mm calculus in the right ureteropelvic junction. She had mild right hydronephrosis. Today she states that she continues to have some intermittent pain though this has improved. She notes right flank pain more at night while she is lying down. Patient is currently manageable. She denies exacerbation of lower urinary tract symptoms or gross hematuria. No complaints of fevers, chills, nausea, or vomiting.     ALLERGIES: None   MEDICATIONS: Tamsulosin Hcl 0.4 mg capsule 1 capsule PO Daily  Advil  Calcium  Hydrocodone-Acetaminophen 5 mg-325 mg tablet 1 tablet PO Q 6 H PRN  Multivitamin  Vitamin D3  Zofran 4 mg tablet 1 tablet PO Q 8 H PRN     GU PSH: ESWL, Right - 11/02/2018     NON-GU PSH: Cesarean Delivery Cholecystectomy (laparoscopic) Hernia Repair     GU PMH: Flank Pain - 08/27/2019 RLQ pain - 08/27/2019 Microscopic hematuria - 02/18/2019 Renal calculus - 2/95/2841 Renal colic - 09/21/4008    NON-GU PMH: Arthritis Hypercholesterolemia Hypertension    FAMILY HISTORY: 2 sons - Other Cancer - Mother Kidney Failure - Grandmother suicide - Father   SOCIAL HISTORY: Marital Status: Married Preferred Language: English; Race: White Current Smoking Status: Patient does not smoke anymore. Has not smoked since 09/30/1994.   Tobacco Use Assessment Completed: Used Tobacco in last 30 days? Drinks 2 caffeinated drinks per day.    REVIEW OF SYSTEMS:    GU Review Female:   Patient reports get up at night to urinate and leakage of urine. Patient denies frequent urination, hard to postpone urination, burning /pain with urination, stream starts and  stops, trouble starting your stream, have to strain to urinate, and being pregnant.  Gastrointestinal (Upper):   Patient denies nausea, vomiting, and indigestion/ heartburn.  Gastrointestinal (Lower):   Patient denies diarrhea and constipation.  Constitutional:   Patient denies  night sweats, fever, fatigue, and weight loss.  Skin:   Patient denies skin rash/ lesion and itching.  Eyes:   Patient denies blurred vision and double vision.  Ears/ Nose/ Throat:   Patient denies sore throat and sinus problems.  Hematologic/Lymphatic:   Patient denies swollen glands and easy bruising.  Cardiovascular:   Patient denies leg swelling and chest pains.  Respiratory:   Patient denies cough and shortness of breath.  Endocrine:   Patient denies excessive thirst.  Musculoskeletal:   Patient denies back pain and joint pain.  Neurological:   Patient denies headaches and dizziness.  Psychologic:   Patient denies depression and anxiety.   VITAL SIGNS:      09/10/2019 08:14 AM  Weight 245 lb / 111.13 kg  Height 64 in / 162.56 cm  BP 118/68 mmHg  Pulse 73 /min  Temperature 97.5 F / 36.3 C  BMI 42.0 kg/m   MULTI-SYSTEM PHYSICAL EXAMINATION:    Constitutional: Well-nourished. No physical deformities. Normally developed. Good grooming.  Respiratory: Normal breath sounds. No labored breathing, no use of accessory muscles.   Cardiovascular: Regular rate and rhythm. No murmur, no gallop. Normal temperature, normal extremity pulses, no swelling, no varicosities.   Skin: No paleness, no jaundice  Neurologic / Psychiatric: Oriented to time, oriented to place, oriented to person. No depression, no anxiety, no agitation.  Gastrointestinal: Obese abdomen. No mass, no tenderness, no rigidity.   Musculoskeletal: Normal gait and station of head and neck.     PAST DATA REVIEWED:  Source Of History:  Patient  Records Review:   Previous Patient Records  Urine Test Review:   Urinalysis  X-Ray Review: KUB: Reviewed Films.  C.T. Abdomen/Pelvis: Reviewed Films. Reviewed Report.     PROCEDURES:         KUB - [id number]  A single view of the abdomen is obtained. No obvious opacities noted within the confines of the left renal shadow or along the expected anatomical course of the left ureter. There  continues to be an approximately 6 mm opacity in the vicinity of the right renal pelvis/UPJ. This is still above when compared to past imaging. No other concerning opacities are noted along the expected anatomical course of the right ureter. Normal appearance of overlying bowel gas pattern.      Patient confirmed No Neulasta OnPro Device.            Urinalysis Dipstick Dipstick Cont'd  Color: Straw Bilirubin: Neg mg/dL  Appearance: Clear Ketones: Neg mg/dL  Specific Gravity: <=6.629 Blood: Neg ery/uL  pH: <=5.0 Protein: Neg mg/dL  Glucose: Neg mg/dL Urobilinogen: 0.2 mg/dL    Nitrites: Neg    Leukocyte Esterase: Neg leu/uL    ASSESSMENT:      ICD-10 Details  1 GU:   Flank Pain - R10.84   2   Renal calculus - N20.0 Acute, Systemic Symptoms   PLAN:           Document Letter(s):  Created for Patient: Clinical Summary         Notes:   Urinalysis today is without infectious parameters or hematuria. On KUB imaging, there continues to be an approximately 6 mm opacity in the vicinity of the right renal pelvis/UPJ. This is stable when compared  to past imaging. No other concerning opacities are noted along the expected anatomical course of the right ureter. Overall, symptoms have improved since prior office visit but she does continue to have some persistent intermittent episodes of right flank pain. We reviewed treatment options in detail today and she is interested in moving forward with intervention. She seems most interested in repeat ESWL. She understands potential for bleeding requiring blood transfusion, perinephric hematoma, infection, injury to surrounding structures, need for additional procedures, inability to break up the entire stone, need for another stone operation. Ultimately, I will review recent imaging studies with her urologist for his recommendations moving forward. We will keep her informed. I encouraged that she continue to hydrate adequately. She does not need refills on  medications at this time. Strict return precautions reviewed in the interim for fevers or progressive pain, nausea, vomiting.           Next Appointment:      Next Appointment: 06/05/2020 08:30 AM    Appointment Type: KUB    Location: [facility] [phone]    Provider: KUB KUB    Reason for Visit: 1 year KUB-[place]      Signed by Vianne Bulls, NP on 09/10/19 at 8:40 AM (EST

## 2019-10-13 NOTE — Discharge Instructions (Addendum)
[facility] [phone] Post Ureteroscopy With or Without Stent Instructions  Definitions:  Ureter: The duct that transports urine from the kidney to the bladder. Stent:   A plastic hollow tube that is placed into the ureter, from the kidney to the                 bladder to prevent the ureter from swelling shut.  GENERAL INSTRUCTIONS:  Despite the fact that no skin incisions were used, the area around the ureter and bladder is raw and irritated. The stent is a foreign body which will further irritate the bladder wall. This irritation is manifested by increased frequency of urination, both day and night, and by an increase in the urge to urinate. In some, the urge to urinate is present almost always. Sometimes the urge is strong enough that you may not be able to stop yourself from urinating. The only real cure is to remove the stent and then give time for the bladder wall to heal which can't be done until the danger of the ureter swelling shut has passed, which varies.  You may see some blood in your urine while the stent is in place and a few days afterwards. Do not be alarmed, even if the urine was clear for a while. Get off your feet and drink lots of fluids until clearing occurs. If you start to pass clots or don't improve, call us.  DIET: You may return to your normal diet immediately. Because of the raw surface of your bladder, alcohol, spicy foods, acid type foods and drinks with caffeine may cause irritation or frequency and should be used in moderation. To keep your urine flowing freely and to avoid constipation, drink plenty of fluids during the day ( 8-10 glasses ). Tip: Avoid cranberry juice because it is very acidic.  ACTIVITY: Your physical activity doesn't need to be restricted. However, if you are very active, you may see some blood in your urine. We suggest that you reduce your activity under these circumstances until the bleeding has stopped.  BOWELS: It is  important to keep your bowels regular during the postoperative period. Straining with bowel movements can cause bleeding. A bowel movement every other day is reasonable. Use a mild laxative if needed, such as Milk of Magnesia 2-3 tablespoons, or 2 Dulcolax tablets. Call if you continue to have problems. If you have been taking narcotics for pain, before, during or after your surgery, you may be constipated. Take a laxative if necessary.   MEDICATION: You should resume your pre-surgery medications unless told not to. You may take oxybutynin or flomax if prescribed for bladder spasms or discomfort from the stent Take pain medication as directed for pain refractory to conservative management  PROBLEMS YOU SHOULD REPORT TO US: Fevers over 100.5 Fahrenheit. Heavy bleeding, or clots ( See above notes about blood in urine ). Inability to urinate. Drug reactions ( hives, rash, nausea, vomiting, diarrhea ). Severe burning or pain with urination that is not improving.   Post Anesthesia Home Care Instructions  Activity: Get plenty of rest for the remainder of the day. A responsible individual must stay with you for 24 hours following the procedure.  For the next 24 hours, DO NOT: -Drive a car -Operate machinery -Drink alcoholic beverages -Take any medication unless instructed by your physician -Make any legal decisions or sign important papers.  Meals: Start with liquid foods such as gelatin or soup. Progress to regular foods as tolerated. Avoid greasy, spicy,   heavy foods. If nausea and/or vomiting occur, drink only clear liquids until the nausea and/or vomiting subsides. Call your physician if vomiting continues.  Special Instructions/Symptoms: Your throat may feel dry or sore from the anesthesia or the breathing tube placed in your throat during surgery. If this causes discomfort, gargle with warm salt water. The discomfort should disappear within 24 hours.  

## 2019-10-13 NOTE — Anesthesia Procedure Notes (Signed)
Procedure Name: LMA Insertion Date/Time: 10/13/2019 9:55 AM Performed by: Chloee Tena D, CRNA Pre-anesthesia Checklist: Patient identified, Emergency Drugs available, Suction available and Patient being monitored Patient Re-evaluated:Patient Re-evaluated prior to induction Oxygen Delivery Method: Circle system utilized Preoxygenation: Pre-oxygenation with 100% oxygen Induction Type: IV induction Ventilation: Mask ventilation without difficulty LMA: LMA inserted LMA Size: 4.0 Tube type: Oral Number of attempts: 1 Placement Confirmation: positive ETCO2 and breath sounds checked- equal and bilateral Tube secured with: Tape Dental Injury: Teeth and Oropharynx as per pre-operative assessment

## 2019-10-13 NOTE — Anesthesia Postprocedure Evaluation (Signed)
Anesthesia Post Note  Patient: Tiffany Glover  Procedure(s) Performed: CYSTOSCOPY RIGHT RETROGRADE PYELOGRAM URETEROSCOPY/HOLMIUM LASER/STENT PLACEMENT (Right Ureter)     Patient location during evaluation: PACU Anesthesia Type: General Level of consciousness: awake and alert Pain management: pain level controlled Vital Signs Assessment: post-procedure vital signs reviewed and stable Respiratory status: spontaneous breathing, nonlabored ventilation, respiratory function stable and patient connected to nasal cannula oxygen Cardiovascular status: blood pressure returned to baseline and stable Postop Assessment: no apparent nausea or vomiting Anesthetic complications: no    Last Vitals:  Vitals:   10/13/19 1115 10/13/19 1205  BP: (!) 164/78 (!) 149/87  Pulse: 64 61  Resp: 13 14  Temp:  36.4 C  SpO2: 98% 100%    Last Pain:  Vitals:   10/13/19 1205  TempSrc: Oral  PainSc:                  Kerrianne Jeng P Dewane Timson

## 2019-10-13 NOTE — Op Note (Signed)
Operative Note  Preoperative diagnosis:  1.  Right ureteropelvic junction calculus  Postoperative diagnosis: 1.  Same  Procedure(s): 1.  Cystoscopy with right retrograde pyelogram, right ureteroscopy with laser lithotripsy, stone extraction, ureteral stent placement  Surgeon: Modena Slater, MD  Assistants: None  Anesthesia: General  Complications: None immediate  EBL: Minimal  Specimens: 1.  Kidney stone  Drains/Catheters: 1.  6 x 24 double-J ureteral stent  Intraoperative findings: 1.  Normal urethra and bladder 2.  Right retrograde pyelogram revealed no obvious hydronephrosis.  No filling defect. 3.  Approximately 1 cm ureteropelvic junction calculus fragmented to smaller fragments followed by basket extraction  Indication: 59 year old female with history nephrolithiasis with a intermittently obstructing right ureteropelvic junction calculus presents for the previously mentioned operation  Description of procedure:  The patient was identified and consent was obtained.  The patient was taken to the operating room and placed in the supine position.  The patient was placed under general anesthesia.  Perioperative antibiotics were administered.  The patient was placed in dorsal lithotomy.  Patient was prepped and draped in a standard sterile fashion and a timeout was performed.  A 21 French rigid cystoscope was advanced into the urethra and into the bladder.  Complete cystoscopy was performed with no abnormal findings.  I advanced a wire up the right ureter and into the kidney under fluoroscopic guidance.  A second wire was advanced alongside this wire and into the kidney under fluoroscopic guidance.  I withdrew the scope and secured one the wires to the drape as a safety wire and use other wire to advance a ureteral access sheath over the wire and up the ureter under continuous fluoroscopic guidance.  Flexible ureteroscopy was performed and the stone was encountered which was  fragmented to smaller fragments.  Basket extraction was performed and the stones were collected.  I performed a complete pyeloscopy and no other clinically significant stone fragments were seen.  I shot a retrograde pyelogram through the scope with findings noted above.  I then withdrew the scope along with the access sheath visualizing the entire ureter upon removal.  I did not note any significant trauma or any other ureteral calculi.  I then backloaded the wire onto the rigid cystoscope and advanced that into the bladder followed by routine placement of a 6 x 24 double-J ureteral stent.  Fluoroscopy confirmed proximal placement and direct visualization confirmed a good coil within the bladder.  I drained the bladder and withdrew the scope.  Patient tolerated the procedure well was stable postoperative.  Plan: Follow-up in 1 week for stent removal

## 2019-10-25 ENCOUNTER — Institutional Professional Consult (permissible substitution): Payer: BC Managed Care – PPO | Admitting: Pulmonary Disease

## 2019-11-10 ENCOUNTER — Encounter: Payer: BC Managed Care – PPO | Admitting: Family Medicine

## 2019-12-01 ENCOUNTER — Ambulatory Visit (INDEPENDENT_AMBULATORY_CARE_PROVIDER_SITE_OTHER): Payer: BC Managed Care – PPO | Admitting: Pulmonary Disease

## 2019-12-01 ENCOUNTER — Encounter: Payer: Self-pay | Admitting: Pulmonary Disease

## 2019-12-01 ENCOUNTER — Other Ambulatory Visit: Payer: Self-pay

## 2019-12-01 VITALS — BP 124/74 | HR 82 | Temp 98.1°F | Ht 64.0 in | Wt 240.8 lb

## 2019-12-01 DIAGNOSIS — R0683 Snoring: Secondary | ICD-10-CM

## 2019-12-01 DIAGNOSIS — J984 Other disorders of lung: Secondary | ICD-10-CM

## 2019-12-01 NOTE — Patient Instructions (Addendum)
Lab tests today  Will schedule pulmonary function test and home sleep study  Follow up in 6 weeks

## 2019-12-01 NOTE — Progress Notes (Signed)
[facility]  Chief Complaint  Patient presents with  . Consult    pt states she had a procedure done quit breathing several times     Constitutional:  BP 124/74 (BP Location: Left Arm, Cuff Size: Normal)   Pulse 82   Temp 98.1 F (36.7 C) (Oral)   Ht 5\' 4"  (1.626 m)   Wt 240 lb 12.8 oz (109.2 kg)   LMP 09/08/2014 (Within Days)   SpO2 99%   BMI 41.33 kg/m   Past Medical History:  Diverticulosis, Eczema, Nephrolithiasis, HTN, HLD, Pre-DM, Varicose veins, Vit D deficiency  Summary:  Tiffany Glover is a 58 y.o. female former smoker with snoring and abnormal CT chest.  Subjective:   She had kidney stone recently.  During assessment for this she had CT abd/pelvis.  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  Had CT chest which confirmed lung cysts.  During procedure in hospital for kidney stone she was noted to have snoring, apnea, and hypoxia.  Advised to get assessment for sleep apnea.    She goes to sleep at 930 pm.  She falls asleep quickly.  She wakes up several times to use the bathroom.  She gets out of bed at 430 am.  She feels tired in the morning.  She denies morning headache.  She does not use anything to help her fall sleep or stay awake.  She denies sleep walking, sleep talking, bruxism, or nightmares.  There is no history of restless legs.  She denies sleep hallucinations, sleep paralysis, or cataplexy.  The Epworth score is 18 out of 24.  Quit smoking years ago.  Gets intermittent pain in her right back.  No history of pneumothorax, or pneumonia.  Denies history of joint pain or swelling.  Does get dry eyes and dry mouth.  Sometimes feels like food gets stuck when she swallows.  No history of HIV or hepatitis.   Physical Exam:   Appearance - well kempt  ENMT - no sinus tenderness, no nasal discharge, no oral exudate, Mallampati 4, scalloped tongue  Respiratory - no wheeze, or rales  CV - regular rate and rhythm, no murmurs  GI  - soft, non tender  Lymph - no adenopathy noted in neck  Ext - no edema  Skin - no rashes  Neuro - normal strength, oriented x 3  Psych - normal mood and affect  Assessment/Plan:   Former smoker with cystic lung lesions. - will check PFT to assess for obstructive lung disease - will check ANA, RF, HIV, Hep A/B/C, IgA/G/M, Alpha 1 antitrypsin, vascular endothelial growth factor - based on results will determine if further lung tissue sampling is required  Snoring with excessive daytime sleepiness. - will need to arrange for a home sleep study  Obesity. - discussed how weight can impact sleep and risk for sleep disordered breathing - discussed options to assist with weight loss: combination of diet modification, cardiovascular and strength training exercises  Cardiovascular risk. - had an extensive discussion regarding the adverse health consequences related to untreated sleep disordered breathing - specifically discussed the risks for hypertension, coronary artery disease, cardiac dysrhythmias, cerebrovascular disease, and diabetes - lifestyle modification discussed  Safe driving practices. - discussed how sleep disruption can increase risk of accidents, particularly when driving - safe driving practices were discussed  Therapies for obstructive sleep apnea. - if the sleep study shows significant sleep apnea, then various therapies for treatment were reviewed: CPAP, oral appliance, and surgical  interventions  A total of 46 minutes addressing patient care on the day of the visit.  Follow up:  Patient Instructions  Lab tests today  Will schedule pulmonary function test and home sleep study  Follow up in 6 weeks           Signature:  Coralyn Helling, MD [facility] Pager: [phone] 12/01/2019, 5:52 PM  Flow Sheet     Pulmonary tests:   Chest imaging:  CT chest 09/08/19 >> scattered cysts increased compared to imaging from 2013  Sleep tests:     Medications:   Allergies as of 12/01/2019   No Known Allergies     Medication List       Accurate as of December 01, 2019  5:52 PM. If you have any questions, ask your nurse or doctor.        STOP taking these medications   CALCIUM + D PO Stopped by: Coralyn Helling, MD   HYDROcodone-acetaminophen 5-325 MG tablet Commonly known as: Norco Stopped by: Coralyn Helling, MD     TAKE these medications   acetaminophen 500 MG tablet Commonly known as: TYLENOL Take 1,000 mg by mouth every 6 (six) hours as needed.   cyclobenzaprine 10 MG tablet Commonly known as: FLEXERIL Take 1 tablet (10 mg total) by mouth 3 (three) times daily as needed for muscle spasms.   multivitamin tablet Take 1 tablet by mouth daily.   triamcinolone cream 0.5 % Commonly known as: KENALOG Apply 1 application topically 2 (two) times daily. To affected areas. What changed:   when to take this  reasons to take this  additional instructions   Vitamin D3 125 MCG (5000 UT) Tabs 5,000 IU OTC vitamin D3 daily. What changed:   how much to take  how to take this  when to take this       Past Surgical History:  She  has a past surgical history that includes Cesarean section (x2  last one 1994); Foot surgery (1990s); vein strippiing (1990s); Incisional hernia repair (N/A, 09/15/2014); Colonoscopy (last one 03-21-2017); Cholecystectomy (07/21/2012); Cholecystectomy (07/21/2012); Extracorporeal shock wave lithotripsy (Right, 11/02/2018); and Cystoscopy/ureteroscopy/holmium laser/stent placement (Right, 10/13/2019).  Family History:  Her family history includes Alcohol abuse in her father, maternal grandfather, and paternal grandfather; Breast cancer in her maternal grandmother; Cancer in her brother, maternal grandmother, and mother; Hyperlipidemia in her paternal grandmother; Hypertension in her paternal grandmother; Suicidality in her father; Urolithiasis in her son.  Social History:  She  reports that she quit  smoking about 24 years ago. Her smoking use included cigarettes. She has a 30.00 pack-year smoking history. She has never used smokeless tobacco. She reports previous alcohol use. She reports that she does not use drugs.

## 2019-12-03 LAB — ANA W/REFLEX IF POSITIVE
Anti JO-1: <0.2 AI (ref 0.0–0.9)
Anti Nuclear Antibody (ANA): POSITIVE — AB
Centromere Ab Screen: <0.2 AI (ref 0.0–0.9)
Chromatin Ab SerPl-aCnc: 0.2 AI (ref 0.0–0.9)
ENA RNP Ab: <0.2 AI (ref 0.0–0.9)
ENA SM Ab Ser-aCnc: <0.2 AI (ref 0.0–0.9)
ENA SSA (RO) Ab: 7.9 AI — ABNORMAL HIGH (ref 0.0–0.9)
ENA SSB (LA) Ab: 0.7 AI (ref 0.0–0.9)
Scleroderma (Scl-70) (ENA) Antibody, IgG: <0.2 AI (ref 0.0–0.9)
dsDNA Ab: <1 IU/mL (ref 0–9)

## 2019-12-06 ENCOUNTER — Telehealth: Payer: Self-pay | Admitting: Pulmonary Disease

## 2019-12-06 DIAGNOSIS — R768 Other specified abnormal immunological findings in serum: Secondary | ICD-10-CM

## 2019-12-06 NOTE — Telephone Encounter (Signed)
Serology 12/01/19 >> positive ANA, SSA Ab 7.9.   Attempted to call pt to review lab results.    Please let her know that one of lab tests came back positive and this could be associated with Sjogren's syndrome.  This could explain her symptoms of dry eyes and dry mouth, and lung cysts seen on CT chest.  She should be referred to a rheumatology to further assess whether she has Sjorgen's syndrome.  Please place referral if she is agreeable.

## 2019-12-07 LAB — HM MAMMOGRAPHY

## 2019-12-07 NOTE — Telephone Encounter (Signed)
Spoke with pt, aware of results/recs.  Referral to rheumatology placed.  Nothing further needed at this time- will close encounter.

## 2019-12-07 NOTE — Telephone Encounter (Signed)
Left message for patient to call back  

## 2019-12-07 NOTE — Telephone Encounter (Signed)
Left detailed message on named VM at pt request.  Will place referral after speaking to pt.  Will call back.

## 2019-12-07 NOTE — Telephone Encounter (Signed)
Pt returning missed call.  Can be reached at [phone]. Pt states that if she misses the call to leave a detailed msg on voicemail.

## 2019-12-09 ENCOUNTER — Other Ambulatory Visit: Payer: Self-pay

## 2019-12-09 ENCOUNTER — Telehealth: Payer: Self-pay | Admitting: Nurse Practitioner

## 2019-12-09 MED ORDER — METRONIDAZOLE 500 MG PO TABS: 500.0000 mg | ORAL_TABLET | Freq: Two times a day (BID) | ORAL | 0 refills | Status: AC

## 2019-12-09 MED ORDER — CIPROFLOXACIN HCL 500 MG PO TABS: 500.0000 mg | ORAL_TABLET | Freq: Two times a day (BID) | ORAL | 0 refills | Status: AC

## 2019-12-09 NOTE — Telephone Encounter (Signed)
Left a message for the patient with this information and that the Rxs have been transmitted to [facility] on [address].

## 2019-12-09 NOTE — Telephone Encounter (Signed)
Pt reported that she "has been eating a lot of strawberries" and is experiencing the same pain when she had a diverticulitis flare.

## 2019-12-09 NOTE — Telephone Encounter (Signed)
Spoke with the patent who is presently out of town. She was last seen in February of this year for similar symptoms.  She reports LLQ pain that began as a nagging discomfort 2 days ago. Yesterday the LLQ became painful and today is worse. She reports she is having 2 soft bowel movements daily. No blood seen. She is afebrile. States she has been eating a large quantity of fresh strawberries and drinking smoothies with Chia seeds. She wonders if this has contributed. The patient is out of town and asks if she can be treated empirically for what she feels is diverticulitis. She feels the same as she did in February. Thanks

## 2019-12-09 NOTE — Telephone Encounter (Signed)
Tiffany Glover, ideally would like to get a CT scan ( didn't get one last time) but since out of town we can't.  Please give 7 days of cipro 500 mg bid and flagyl 500 mg tid. Clears until feeling better. Follow up with me in the office. Thanks

## 2019-12-10 ENCOUNTER — Other Ambulatory Visit: Payer: Self-pay

## 2019-12-10 ENCOUNTER — Encounter: Payer: Self-pay | Admitting: Physician Assistant

## 2019-12-10 ENCOUNTER — Ambulatory Visit (INDEPENDENT_AMBULATORY_CARE_PROVIDER_SITE_OTHER): Payer: BC Managed Care – PPO | Admitting: Physician Assistant

## 2019-12-10 VITALS — BP 112/73 | HR 89 | Temp 98.3°F | Ht 63.75 in | Wt 236.4 lb

## 2019-12-10 DIAGNOSIS — E039 Hypothyroidism, unspecified: Secondary | ICD-10-CM

## 2019-12-10 DIAGNOSIS — Z0000 Encounter for general adult medical examination without abnormal findings: Secondary | ICD-10-CM

## 2019-12-10 DIAGNOSIS — E559 Vitamin D deficiency, unspecified: Secondary | ICD-10-CM

## 2019-12-10 DIAGNOSIS — E785 Hyperlipidemia, unspecified: Secondary | ICD-10-CM | POA: Diagnosis not present

## 2019-12-10 DIAGNOSIS — R7301 Impaired fasting glucose: Secondary | ICD-10-CM

## 2019-12-10 DIAGNOSIS — I10 Essential (primary) hypertension: Secondary | ICD-10-CM

## 2019-12-10 NOTE — Progress Notes (Signed)
Female Physical   Impression and Recommendations:    1. Healthcare maintenance   2. Hypothyroidism, unspecified type   3. Hyperlipidemia, unspecified hyperlipidemia type   4. Elevated fasting blood sugar   5. New Onset- Essential hypertension   6. Vitamin D insufficiency   7. IFG (impaired fasting glucose)   8. Obesity >er 40 ([place])      1) Anticipatory Guidance: Discussed skin CA prevention and sunscreen when outside along with skin surveillance; eating a balanced and modest diet; physical activity at least 25 minutes per day or minimum of 150 min/ week moderate to intense activity. - Exercise regimen includes walking and swimming several times/wk.  2) Immunizations / Screenings / Labs:   All immunizations are up-to-date per recommendations or will be updated today if pt allows.    - Patient understands with dental and vision screens they will schedule independently.  - Will obtain CBC, CMP, HgA1c, Lipid panel, TSH and vit D. -Up-to-date on mammogram and colonoscopy.  - Pt has diverticulitis flare-up and declined Tdap and Shingrix vaccine today.   -Up-to-date on hepatitis C and HIV screening.  3) Weight:  BMI meaning discussed with patient.  Discussed goal to improve diet habits to improve overall feelings of well being and objective health data. Improve nutrient density of diet through increasing intake of fruits and vegetables and decreasing saturated fats, white flour products and refined sugars. - Pt has made dietary changes by reducing process foods, dairy, sweets and increased vegetables and lean meats.  4) health care maintenance: -Continue current medication regimen. -Follow heart healthy diet. -Continue with dietary and lifestyle modifications. -Stay well hydrated, at least 64 fl oz -Continue moderate intensity physical activity. -Follow-up in 6 months for regular office visit: HTN, HLD  No orders of the defined types were placed in this encounter.   Orders Placed  This Encounter  Procedures  . CBC with Differential/Platelet  . Comprehensive metabolic panel  . Hemoglobin A1c  . Lipid panel  . TSH  . VITAMIN D 25 Hydroxy (Vit-D Deficiency, Fractures)     Return in about 1 year (around 12/09/2020) for CPE; HTN, HLD in 6 months.    Gross side effects, risk and benefits, and alternatives of medications discussed with patient.  Patient is aware that all medications have potential side effects and we are unable to predict every side effect or drug-drug interaction that may occur.  Expresses verbal understanding and consents to current therapy plan and treatment regimen.  F-up preventative CPE in 1 year, this is in addition to any chronic care visits.    Please see orders placed and AVS handed out to patient at the end of our visit for further patient instructions/ counseling done pertaining to today's office visit.   Subjective:     CPE HPI: Tiffany Glover is a 59 y.o. female who presents to [place] at [facility] today for a yearly health maintenance exam.   Health Maintenance Summary  - Reviewed and updated, unless pt declines services.  Last Cologuard or Colonoscopy:  03/21/17, repeat in 10 years Family history of Colon CA: No  Tobacco History Reviewed:  Y, former smoker CT scan for screening lung CA:  Pt followed by Pulmonology and currently going through several tests so wants to wait on ordering. If not ordered by Pulmonology, will place order. Alcohol and/or drug use: No concerns; no use Dental Home: Y Eye exams: Y Dermatology home: Y  Female Health:  PAP Smear - last known  results: 08/28/15- negative.  Followed by OB/GYN and has an appointment scheduled for next month. STD concerns:   none, monogamous Lumps or breast concerns:  none Breast Cancer Family History: No    Additional concerns beyond health maintenance issues:     Immunization History  Administered Date(s) Administered  . PPD Test 12/09/2011      Health Maintenance  Topic Date Due  . COVID-19 Vaccine (1) Never done  . TETANUS/TDAP  Never done  . INFLUENZA VACCINE  01/30/2020  . PAP SMEAR-Modifier  08/27/2020  . MAMMOGRAM  12/06/2021  . COLONOSCOPY  03/22/2027  . Hepatitis C Screening  Completed  . HIV Screening  Completed     Wt Readings from Last 3 Encounters:  12/10/19 236 lb 6.4 oz (107.2 kg)  12/01/19 240 lb 12.8 oz (109.2 kg)  10/13/19 247 lb 4.8 oz (112.2 kg)   BP Readings from Last 3 Encounters:  12/10/19 112/73  12/01/19 124/74  10/13/19 (!) 149/87   Pulse Readings from Last 3 Encounters:  12/10/19 89  12/01/19 82  10/13/19 61     Past Medical History:  Diagnosis Date  . Complication of anesthesia    hard to wake up once  . Diverticulosis of colon   . Eczema   . Frequency of urination   . History of hypothyroidism    10-06-2019  per pt no medication needed in several yrs, lab work normal  . History of kidney stones   . Hypertension    10-06-2019 per pt her pcp stopped medication 12/ 2019 (note in epic)  due to pt lost weight and bp improved;  currently per pt she is monitoring bp at home for pcp  . Mixed hyperlipidemia   . Pre-diabetes    followed by pcp, watching diet  . Renal calculus, right   . Urgency of urination   . Varicose vein of leg   . Vitamin D deficiency       Past Surgical History:  Procedure Laterality Date  . CESAREAN SECTION  x2  last one 52  . CHOLECYSTECTOMY  07/21/2012   Procedure: LAPAROSCOPIC CHOLECYSTECTOMY WITH INTRAOPERATIVE CHOLANGIOGRAM;  Surgeon: Emelia Loron, MD;  Location: [facility];  Service: General;  Laterality: N/A;  attempted  . CHOLECYSTECTOMY  07/21/2012   Procedure: CHOLECYSTECTOMY;  Surgeon: Emelia Loron, MD;  Location: [facility];  Service: General;  Laterality: N/A;  . COLONOSCOPY  last one 03-21-2017  . CYSTOSCOPY/URETEROSCOPY/HOLMIUM LASER/STENT PLACEMENT Right 10/13/2019   Procedure: CYSTOSCOPY RIGHT RETROGRADE PYELOGRAM  URETEROSCOPY/HOLMIUM LASER/STENT PLACEMENT;  Surgeon: Crista Elliot, MD;  Location: [facility];  Service: Urology;  Laterality: Right;  . EXTRACORPOREAL SHOCK WAVE LITHOTRIPSY Right 11/02/2018   Procedure: EXTRACORPOREAL SHOCK WAVE LITHOTRIPSY (ESWL);  Surgeon: Crista Elliot, MD;  Location: [facility];  Service: Urology;  Laterality: Right;  . FOOT SURGERY  1990s   ganglion cyst removed from right foot  . INCISIONAL HERNIA REPAIR N/A 09/15/2014   Procedure: LAPAROSCOPIC INCISIONAL HERNIA REPAIR WITH MESH;  Surgeon: Emelia Loron, MD;  Location: [facility];  Service: General;  Laterality: N/A;  . vein strippiing  1990s   left leg      Family History  Problem Relation Age of Onset  . Cancer Mother        Lung  . Suicidality Father   . Alcohol abuse Father   . Cancer Brother        mouth  . Cancer Maternal Grandmother   . Breast cancer Maternal Grandmother   .  Alcohol abuse Maternal Grandfather   . Urolithiasis Son   . Hyperlipidemia Paternal Grandmother   . Hypertension Paternal Grandmother   . Alcohol abuse Paternal Grandfather   . Colon cancer Neg Hx   . Colon polyps Neg Hx   . Esophageal cancer Neg Hx   . Rectal cancer Neg Hx   . Stomach cancer Neg Hx       Social History   Substance and Sexual Activity  Drug Use Never  ,   Social History   Substance and Sexual Activity  Alcohol Use Not Currently   Comment: very rare  ,   Social History   Tobacco Use  Smoking Status Former Smoker  . Packs/day: 1.50  . Years: 20.00  . Pack years: 30.00  . Types: Cigarettes  . Quit date: 03/02/1995  . Years since quitting: 24.8  Smokeless Tobacco Never Used  ,   Social History   Substance and Sexual Activity  Sexual Activity Not on file    Current Outpatient Medications on File Prior to Visit  Medication Sig Dispense Refill  . acetaminophen (TYLENOL) 500 MG tablet Take 1,000 mg by mouth every 6 (six) hours as needed.    . Cholecalciferol (VITAMIN  D3) 5000 units TABS 5,000 IU OTC vitamin D3 daily. (Patient taking differently: Take 1 tablet by mouth daily. 5,000 IU OTC vitamin D3 daily.) 90 tablet 3  . cyclobenzaprine (FLEXERIL) 10 MG tablet Take 1 tablet (10 mg total) by mouth 3 (three) times daily as needed for muscle spasms. 30 tablet 0  . Multiple Vitamin (MULTIVITAMIN) tablet Take 1 tablet by mouth daily.    Marland Kitchen triamcinolone cream (KENALOG) 0.5 % Apply 1 application topically 2 (two) times daily. To affected areas. (Patient taking differently: Apply 1 application topically 2 (two) times daily as needed. To affected areas as needed) 60 g 1   No current facility-administered medications on file prior to visit.    Allergies: Patient has no known allergies.  Review of Systems: General:   Denies fever, chills, unexplained weight loss.  Optho/Auditory:   Denies visual changes, blurred vision/LOV Respiratory:   Denies SOB, DOE more than baseline levels.   Cardiovascular:   Denies chest pain, palpitations, new onset peripheral edema  Gastrointestinal:   Denies nausea, vomiting, diarrhea.  Genitourinary: Denies dysuria, freq/ urgency, flank pain  Endocrine:     Denies hot or cold intolerance, polyuria, polydipsia. Musculoskeletal:   Denies unexplained myalgias, joint swelling, unexplained arthralgias, gait problems.  Skin:  Denies rash, suspicious lesions Neurological:     Denies dizziness, unexplained weakness, numbness  Psychiatric/Behavioral:   Denies mood changes, suicidal or homicidal ideations, hallucinations    Objective:    Blood pressure 112/73, pulse 89, temperature 98.3 F (36.8 C), temperature source Oral, height 5' 3.75" (1.619 m), weight 236 lb 6.4 oz (107.2 kg), last menstrual period 09/08/2014, SpO2 97 %. Body mass index is 40.9 kg/m. General Appearance:    Alert, cooperative, no distress, appears stated age  Head:    Normocephalic, without obvious abnormality, atraumatic  Eyes:    PERRL, conjunctiva/corneas clear,  EOM's intact, fundi    benign, both eyes  Ears:    Normal TM's and external ear canals, both ears  Nose:   Nares normal, septum midline, mucosa normal, no drainage    or sinus tenderness  Throat:   Lips w/o lesion, mucosa moist, and tongue normal; teeth and   gums normal  Neck:   Supple, symmetrical, trachea midline, no adenopathy;  thyroid:  no enlargement/tenderness/nodules; no carotid   bruit or JVD  Back:     Symmetric, no curvature, ROM normal, no CVA tenderness  Lungs:     Clear to auscultation bilaterally, respirations unlabored, no       Wh/ R/ R  Chest Wall:    No tenderness or gross deformity; normal excursion   Heart:    Regular rate and rhythm, S1 and S2 normal, no murmur, rub   or gallop  Breast Exam:   Deferred.  Mammogram done June 8, negative  Abdomen:    Soft, TTP of LLQ, bowel sounds active all four quadrants, No   G/R/R, no masses, no organomegaly  Genitalia:   Deferred to OB/GYN  Rectal:   Deferred to OB/GYN  Extremities:   Extremities normal, atraumatic, no cyanosis or gross edema  Pulses:   Good pulses.  Skin:   Warm, dry, Skin color, texture, turgor normal, no obvious rashes or lesions Psych: No HI/SI, judgement and insight good, Euthymic mood. Full Affect.  Neurologic:   CNII-XII intact, normal strength, sensation and reflexes    Throughout

## 2019-12-10 NOTE — Patient Instructions (Signed)

## 2019-12-11 LAB — CBC WITH DIFFERENTIAL/PLATELET
Basophils Absolute: 0.1 10*3/uL (ref 0.0–0.2)
Basos: 1 %
EOS (ABSOLUTE): 0.1 10*3/uL (ref 0.0–0.4)
Eos: 1 %
Hematocrit: 45.4 % (ref 34.0–46.6)
Hemoglobin: 14.5 g/dL (ref 11.1–15.9)
Immature Grans (Abs): 0 10*3/uL (ref 0.0–0.1)
Immature Granulocytes: 0 %
Lymphocytes Absolute: 1.5 10*3/uL (ref 0.7–3.1)
Lymphs: 23 %
MCH: 28.8 pg (ref 26.6–33.0)
MCHC: 31.9 g/dL (ref 31.5–35.7)
MCV: 90 fL (ref 79–97)
Monocytes Absolute: 0.6 10*3/uL (ref 0.1–0.9)
Monocytes: 10 %
Neutrophils Absolute: 4.3 10*3/uL (ref 1.4–7.0)
Neutrophils: 65 %
Platelets: 216 10*3/uL (ref 150–450)
RBC: 5.03 x10E6/uL (ref 3.77–5.28)
RDW: 13.1 % (ref 11.7–15.4)
WBC: 6.6 10*3/uL (ref 3.4–10.8)

## 2019-12-11 LAB — HEMOGLOBIN A1C
Est. average glucose Bld gHb Est-mCnc: 117 mg/dL
Hgb A1c MFr Bld: 5.7 % — ABNORMAL HIGH (ref 4.8–5.6)

## 2019-12-11 LAB — COMPREHENSIVE METABOLIC PANEL
ALT: 12 IU/L (ref 0–32)
AST: 16 IU/L (ref 0–40)
Albumin/Globulin Ratio: 1.6 (ref 1.2–2.2)
Albumin: 4.5 g/dL (ref 3.8–4.9)
Alkaline Phosphatase: 68 IU/L (ref 48–121)
BUN/Creatinine Ratio: 23 (ref 9–23)
BUN: 14 mg/dL (ref 6–24)
Bilirubin Total: 0.3 mg/dL (ref 0.0–1.2)
CO2: 23 mmol/L (ref 20–29)
Calcium: 9.5 mg/dL (ref 8.7–10.2)
Chloride: 103 mmol/L (ref 96–106)
Creatinine, Ser: 0.6 mg/dL (ref 0.57–1.00)
GFR calc Af Amer: 115 mL/min/{1.73_m2} (ref >59)
GFR calc non Af Amer: 100 mL/min/{1.73_m2} (ref >59)
Globulin, Total: 2.9 g/dL (ref 1.5–4.5)
Glucose: 102 mg/dL — ABNORMAL HIGH (ref 65–99)
Potassium: 4.9 mmol/L (ref 3.5–5.2)
Sodium: 142 mmol/L (ref 134–144)
Total Protein: 7.4 g/dL (ref 6.0–8.5)

## 2019-12-11 LAB — LIPID PANEL
Chol/HDL Ratio: 2.4 ratio (ref 0.0–4.4)
Cholesterol, Total: 171 mg/dL (ref 100–199)
HDL: 70 mg/dL (ref >39)
LDL Chol Calc (NIH): 92 mg/dL (ref 0–99)
Triglycerides: 43 mg/dL (ref 0–149)
VLDL Cholesterol Cal: 9 mg/dL (ref 5–40)

## 2019-12-11 LAB — TSH: TSH: 3.1 u[IU]/mL (ref 0.450–4.500)

## 2019-12-11 LAB — VITAMIN D 25 HYDROXY (VIT D DEFICIENCY, FRACTURES): Vit D, 25-Hydroxy: 40.4 ng/mL (ref 30.0–100.0)

## 2019-12-14 ENCOUNTER — Encounter: Payer: Self-pay | Admitting: Physician Assistant

## 2019-12-15 LAB — RHEUMATOID FACTOR: Rheumatoid fact SerPl-aCnc: <14 IU/mL (ref <14)

## 2019-12-15 LAB — HEPATITIS C ANTIBODY
Hepatitis C Ab: NONREACTIVE
SIGNAL TO CUT-OFF: 0.11 (ref <1.00)

## 2019-12-15 LAB — HEPATITIS B SURFACE ANTIBODY,QUALITATIVE: Hep B S Ab: NONREACTIVE

## 2019-12-15 LAB — EXTRA SPECIMEN

## 2019-12-15 LAB — IGG, IGA, IGM
IgG (Immunoglobin G), Serum: 1393 mg/dL (ref 600–1640)
IgM, Serum: 77 mg/dL (ref 50–300)
Immunoglobulin A: 163 mg/dL (ref 47–310)

## 2019-12-15 LAB — ALPHA-1 ANTITRYPSIN PHENOTYPE: A-1 Antitrypsin, Ser: 146 mg/dL (ref 83–199)

## 2019-12-15 LAB — HEPATITIS A ANTIBODY, TOTAL: Hepatitis A AB,Total: NONREACTIVE

## 2019-12-15 LAB — HIV ANTIBODY (ROUTINE TESTING W REFLEX): HIV 1&2 Ab, 4th Generation: NONREACTIVE

## 2019-12-15 LAB — VASCULAR ENDOTHELIAL GROWTH FACTOR: VASCULAR ENDOTHELIAL GROWTH FACTOR: <31 pg/mL — ABNORMAL LOW (ref 31–86)

## 2019-12-31 ENCOUNTER — Ambulatory Visit: Payer: BC Managed Care – PPO | Admitting: Physician Assistant

## 2020-01-12 ENCOUNTER — Ambulatory Visit: Payer: BC Managed Care – PPO

## 2020-01-12 ENCOUNTER — Other Ambulatory Visit: Payer: Self-pay

## 2020-01-12 ENCOUNTER — Ambulatory Visit: Payer: BC Managed Care – PPO | Admitting: Family Medicine

## 2020-01-12 DIAGNOSIS — R0683 Snoring: Secondary | ICD-10-CM

## 2020-01-12 DIAGNOSIS — G4733 Obstructive sleep apnea (adult) (pediatric): Secondary | ICD-10-CM

## 2020-01-14 ENCOUNTER — Telehealth: Payer: Self-pay | Admitting: Pulmonary Disease

## 2020-01-14 DIAGNOSIS — G4733 Obstructive sleep apnea (adult) (pediatric): Secondary | ICD-10-CM | POA: Diagnosis not present

## 2020-01-14 NOTE — Telephone Encounter (Signed)
Pt has an appt scheduled 7/20 with VS.  Attempted to call pt but unable to reach. Left message for her to return call.

## 2020-01-14 NOTE — Telephone Encounter (Signed)
Called and spoke with pt letting her know the results of the HST and she verbalized understanding. Nothing further needed. 

## 2020-01-14 NOTE — Telephone Encounter (Signed)
HST 01/12/20 >> AHI 28.5, SpO2 low 76%.   Please inform her that her sleep study shows moderate obstructive sleep apnea.  Please arrange for ROV with me or NP to discuss treatment options.  Please let her know test results were still valid even though she had yellow light flashing on test device intermittently.

## 2020-01-14 NOTE — Telephone Encounter (Signed)
Pt returning a phone call. Pt can be reached at [phone].

## 2020-01-18 ENCOUNTER — Encounter: Payer: Self-pay | Admitting: Pulmonary Disease

## 2020-01-18 ENCOUNTER — Other Ambulatory Visit: Payer: Self-pay

## 2020-01-18 ENCOUNTER — Ambulatory Visit: Payer: BC Managed Care – PPO | Admitting: Pulmonary Disease

## 2020-01-18 VITALS — BP 126/80 | HR 67 | Temp 98.0°F | Ht 64.0 in | Wt 241.8 lb

## 2020-01-18 DIAGNOSIS — R768 Other specified abnormal immunological findings in serum: Secondary | ICD-10-CM | POA: Diagnosis not present

## 2020-01-18 DIAGNOSIS — G4733 Obstructive sleep apnea (adult) (pediatric): Secondary | ICD-10-CM | POA: Diagnosis not present

## 2020-01-18 DIAGNOSIS — Z7189 Other specified counseling: Secondary | ICD-10-CM

## 2020-01-18 DIAGNOSIS — J984 Other disorders of lung: Secondary | ICD-10-CM

## 2020-01-18 DIAGNOSIS — G4730 Sleep apnea, unspecified: Secondary | ICD-10-CM | POA: Diagnosis not present

## 2020-01-18 DIAGNOSIS — E669 Obesity, unspecified: Secondary | ICD-10-CM

## 2020-01-18 NOTE — Progress Notes (Signed)
[facility]  Chief Complaint  Patient presents with   Follow-up    Review HST.     Constitutional:  BP 126/80 (BP Location: Left Arm, Cuff Size: Large)    Pulse 67    Temp 98 F (36.7 C) (Oral)    Ht 5\' 4"  (1.626 m)    Wt 241 lb 12.8 oz (109.7 kg)    LMP 09/08/2014 (Within Days)    SpO2 100% Comment: on RA   BMI 41.50 kg/m   Past Medical History:  Diverticulosis, Eczema, Nephrolithiasis, HTN, HLD, Pre-DM, Varicose veins, Vit D deficiency  Summary:  Tiffany Glover is a 59 y.o. female former smoker with snoring and abnormal CT chest.  Subjective:  Since her last visit she had lab testing done.  Showed positive ANA and positive SSA antibody.  She has been referred to rheumatology, but earliest appointment available is in October 2021.  She has dry eyes and mouth, and sometimes hard to swallow food.  Not having skin rash, dyspnea, wheeze.  Has occasional cough.  Had home sleep study.  Showed moderate sleep apnea.  Physical Exam:   Appearance - well kempt   ENMT - no sinus tenderness, no oral exudate, no LAN, Mallampati 4 airway, no stridor, scalloped tongue  Respiratory - equal breath sounds bilaterally, no wheezing or rales  CV - s1s2 regular rate and rhythm, no murmurs  Ext - no clubbing, no edema  Skin - no rashes  Psych - normal mood and affect   Assessment/Plan:   Former smoker with cystic lung lesions. - has positive ANA and SSA antibody - likely from LIP in setting of Sjogren's disease - will make sure PFT scheduled - she has appointment with Dr. November 2021 in October 2021  Snoring with excessive daytime sleepiness from obstructive sleep apnea. - sleep test reviewed with her - treatment options reviewed - will arrange for auto CPAP set up  Obesity. - discussed how weight can impact sleep and risk for sleep disordered breathing - discussed options to assist with weight loss: combination of diet modification,  cardiovascular and strength training exercises  A total of  32 minutes spent addressing patient care issues on day of visit.   Follow up:  Patient Instructions  Will arrange for auto CPAP set up and make sure pulmonary function test scheduled  Follow up in 3 months   Signature:  November 2021, MD [facility] Pager: [phone] 01/18/2020, 9:43 AM  Flow Sheet     Pulmonary tests:   Serology 12/01/19 >> positive ANA, SSA Ab 7.9.  Chest imaging:   CT chest 09/08/19 >> scattered cysts increased compared to imaging from 2013  Sleep tests:   HST 01/12/20 >> AHI 28.5, SpO2 low 76%.  Medications:   Allergies as of 01/18/2020   No Known Allergies     Medication List       Accurate as of January 18, 2020  9:43 AM. If you have any questions, ask your nurse or doctor.        acetaminophen 500 MG tablet Commonly known as: TYLENOL Take 1,000 mg by mouth every 6 (six) hours as needed.   cyclobenzaprine 10 MG tablet Commonly known as: FLEXERIL Take 1 tablet (10 mg total) by mouth 3 (three) times daily as needed for muscle spasms.   multivitamin tablet Take 1 tablet by mouth daily.   triamcinolone cream 0.5 % Commonly known as: KENALOG Apply 1 application topically 2 (two) times daily. To  affected areas. What changed:   when to take this  reasons to take this  additional instructions   Vitamin D3 125 MCG (5000 UT) Tabs 5,000 IU OTC vitamin D3 daily. What changed:   how much to take  how to take this  when to take this       Past Surgical History:  She  has a past surgical history that includes Cesarean section (x2  last one 1994); Foot surgery (1990s); vein strippiing (1990s); Incisional hernia repair (N/A, 09/15/2014); Colonoscopy (last one 03-21-2017); Cholecystectomy (07/21/2012); Cholecystectomy (07/21/2012); Extracorporeal shock wave lithotripsy (Right, 11/02/2018); and Cystoscopy/ureteroscopy/holmium laser/stent placement (Right,  10/13/2019).  Family History:  Her family history includes Alcohol abuse in her father, maternal grandfather, and paternal grandfather; Breast cancer in her maternal grandmother; Cancer in her brother, maternal grandmother, and mother; Hyperlipidemia in her paternal grandmother; Hypertension in her paternal grandmother; Suicidality in her father; Urolithiasis in her son.  Social History:  She  reports that she quit smoking about 24 years ago. Her smoking use included cigarettes. She has a 30.00 pack-year smoking history. She has never used smokeless tobacco. She reports previous alcohol use. She reports that she does not use drugs.

## 2020-01-18 NOTE — Patient Instructions (Signed)
Will arrange for auto CPAP set up and make sure pulmonary function test scheduled  Follow up in 3 months

## 2020-04-04 NOTE — Progress Notes (Signed)
Office Visit Note  Patient: Tiffany Glover             Date of Birth: [birth year]           MRN: [id number]             PCP: Mayer Masker, PA-C Referring: Coralyn Helling, MD Visit Date: 04/18/2020 Occupation: @GUAROCC @  Subjective:  New Patient (Initial Visit) and Abnormal Lab   History of Present Illness: Tiffany Glover is a 59 y.o. female seen in consultation per request of Dr. 46.  According to the patient she has had history of gallbladder stones and kidney stones for a long time.  In 2013 she had a CT scan for gallbladder stones which showed no changes in her lungs.  She states recently she has been having problems with kidney stones and had a CT scan without contrast which shows showed some changes in the lungs.  She was evaluated by her PCP and had a CT scan with contrast which showed some changes in her lungs and she was referred to Dr. 2014.  He evaluated the patient for possible lymphocytic interstitial pneumonitis.  He also did some blood work which came positive for ANA and positive Ro antibody.  Patient states that she has dry eyes and dry skin.  She used to have dry mouth but the symptoms resolved after being on CPAP.  There is no family history of autoimmune disease.  There is no history of oral ulcers, nasal ulcers, malar rash, photosensitivity, Raynaud's phenomenon, arthritis.  She is gravida 2 para 2 miscarriages 0.  Activities of Daily Living:  Patient reports morning stiffness for 0 minutes.   Patient Denies nocturnal pain.  Difficulty dressing/grooming: Denies Difficulty climbing stairs: Denies Difficulty getting out of chair: Denies Difficulty using hands for taps, buttons, cutlery, and/or writing: Denies  Review of Systems  Constitutional: Negative for fatigue, weight gain and weight loss.  HENT: Negative for mouth sores, trouble swallowing, trouble swallowing, mouth dryness and nose dryness.   Eyes: Positive for itching and dryness. Negative for pain, redness  and visual disturbance.  Respiratory: Negative for cough, hemoptysis, shortness of breath and difficulty breathing.   Cardiovascular: Positive for swelling in legs/feet. Negative for chest pain, palpitations, hypertension and irregular heartbeat.  Gastrointestinal: Negative for abdominal pain, blood in stool, constipation and diarrhea.  Endocrine: Negative for increased urination.  Genitourinary: Positive for vaginal dryness. Negative for painful urination.  Musculoskeletal: Positive for myalgias, muscle tenderness and myalgias. Negative for arthralgias, joint pain, joint swelling, muscle weakness and morning stiffness.  Skin: Positive for hair loss. Negative for color change, rash, redness, skin tightness, ulcers and sensitivity to sunlight.  Allergic/Immunologic: Negative for susceptible to infections.  Neurological: Negative for dizziness and headaches.  Hematological: Negative for swollen glands.  Psychiatric/Behavioral: Positive for sleep disturbance. Negative for depressed mood and confusion. The patient is not nervous/anxious.        Sleep apnea on CPAP    PMFS History:  Patient Active Problem List   Diagnosis Date Noted  . Physical deconditioning 09/11/2019  . Abnormal finding on radiology exam- possible emphasema findings on CT abd/pelvis by urology 09/11/2019  . Kidney stone 09/08/2019  . Asymptomatic microscopic hematuria 06/03/2018  . History of nephrolithiasis- not requiring surgery 06/03/2018  . Prediabetes 06/03/2018  . Chronic right-sided thoracic back pain-muscle spasms medial to right scapula and inferior to right scapula 03/18/2018  . Night muscle spasms-bilateral calves and hamstrings. 03/18/2018  . Glucose intolerance (impaired glucose tolerance) 03/18/2018  .  Urine discoloration 03/18/2018  . Dehydration, mild 03/18/2018  . Chronic bilateral thoracic back pain 09/26/2017  . Myalgia 09/26/2017  . Plantar fascial fibromatosis of right foot 11/10/2016  . Muscle  cramps in legs b/l 11/10/2016  . Salt craving 06/11/2016  . New Onset- Essential hypertension 06/01/2016  . Bilateral impacted cerumen 06/01/2016  . Person with feared health complaint in whom no diagnosis is made 06/01/2016  . Seborrheic dermatitis- ears, scalp 06/01/2016  . Counseling on health promotion and disease prevention 06/01/2016  . Vitamin D insufficiency 03/26/2016  . History of laparoscopic cholecystectomy 02/25/2016  . History of smoking 30 pack years- Quit 9/ 1/ 96 02/25/2016  . Family history of suicide- father age 60 02/25/2016  . Family history of alcoholism in father; grandfathers etc 02/25/2016  . 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  . Personal history of noncompliance with medical treatment and regimen 02/25/2016  . Elevated blood pressure reading without diagnosis of hypertension 02/25/2016  . Incisional hernia 09/15/2014  . Incisional hernia, without obstruction or gangrene 08/08/2014  . Allergic rhinitis/ seasonal allergies 05/17/2014  . DUB (dysfunctional uterine bleeding) 05/17/2014  . HLD (hyperlipidemia) 05/17/2014  . Elevated fasting blood sugar 05/17/2014  . Class 2 obesity in adult 05/17/2014  . IFG (impaired fasting glucose) 05/17/2014  . Menopausal symptoms 05/17/2014  . Shoulder joint pain 05/17/2014  . Morbid obesity (HCC) 09/14/2013  . h/o Hypothyroidism 09/04/2007  . h/o Hemorrhoids, internal, with bleeding 09/04/2007  . h/o Hemorrhoids, external 09/04/2007  . History of diverticulosis 09/04/2007    Past Medical History:  Diagnosis Date  . Complication of anesthesia    hard to wake up once  . Diverticulosis of colon   . Eczema   . Frequency of urination   . History of hypothyroidism    10-06-2019  per pt no medication needed in several yrs, lab work normal  . History of kidney stones   . Hypertension    10-06-2019 per pt her pcp stopped medication 12/ 2019 (note in epic)  due to pt lost weight and bp improved;  currently per pt she is  monitoring bp at home for pcp  . Mixed hyperlipidemia   . Pre-diabetes    followed by pcp, watching diet  . Renal calculus, right   . Urgency of urination   . Varicose vein of leg   . Vitamin D deficiency     Family History  Problem Relation Age of Onset  . Cancer Mother        Lung  . Suicidality Father   . Alcohol abuse Father   . Cancer Brother        mouth  . Cancer Maternal Grandmother   . Breast cancer Maternal Grandmother   . Alcohol abuse Maternal Grandfather   . Urolithiasis Son   . Hyperlipidemia Paternal Grandmother   . Hypertension Paternal Grandmother   . Alcohol abuse Paternal Grandfather   . Colon cancer Neg Hx   . Colon polyps Neg Hx   . Esophageal cancer Neg Hx   . Rectal cancer Neg Hx   . Stomach cancer Neg Hx    Past Surgical History:  Procedure Laterality Date  . CESAREAN SECTION  x2  last one 281994  . CHOLECYSTECTOMY  07/21/2012   Procedure: LAPAROSCOPIC CHOLECYSTECTOMY WITH INTRAOPERATIVE CHOLANGIOGRAM;  Surgeon: Emelia LoronMatthew Wakefield, MD;  Location: [facility];  Service: General;  Laterality: N/A;  attempted  . CHOLECYSTECTOMY  07/21/2012   Procedure: CHOLECYSTECTOMY;  Surgeon: Emelia LoronMatthew Wakefield, MD;  Location: [facility];  Service: General;  Laterality: N/A;  . COLONOSCOPY  last one 03-21-2017  . CYSTOSCOPY/URETEROSCOPY/HOLMIUM LASER/STENT PLACEMENT Right 10/13/2019   Procedure: CYSTOSCOPY RIGHT RETROGRADE PYELOGRAM URETEROSCOPY/HOLMIUM LASER/STENT PLACEMENT;  Surgeon: Crista Elliot, MD;  Location: [facility];  Service: Urology;  Laterality: Right;  . EXTRACORPOREAL SHOCK WAVE LITHOTRIPSY Right 11/02/2018   Procedure: EXTRACORPOREAL SHOCK WAVE LITHOTRIPSY (ESWL);  Surgeon: Crista Elliot, MD;  Location: [facility];  Service: Urology;  Laterality: Right;  . FOOT SURGERY  1990s   ganglion cyst removed from right foot  . INCISIONAL HERNIA REPAIR N/A 09/15/2014   Procedure: LAPAROSCOPIC INCISIONAL HERNIA REPAIR WITH MESH;  Surgeon: Emelia Loron,  MD;  Location: [facility];  Service: General;  Laterality: N/A;  . vein strippiing  1990s   left leg   Social History   Social History Narrative  . Not on file   Immunization History  Administered Date(s) Administered  . PPD Test 12/09/2011     Objective: Vital Signs: BP (!) 153/78 (BP Location: Right Arm, Patient Position: Sitting, Cuff Size: Small)   Pulse 71   Ht 5\' 4"  (1.626 m)   Wt 254 lb 9.6 oz (115.5 kg)   LMP 09/08/2014 (Within Days)   BMI 43.70 kg/m    Physical Exam Vitals and nursing note reviewed.  Constitutional:      Appearance: She is well-developed.  HENT:     Head: Normocephalic and atraumatic.  Eyes:     Conjunctiva/sclera: Conjunctivae normal.     Comments: Mild conjunctival erythema was noted.  Cardiovascular:     Rate and Rhythm: Normal rate and regular rhythm.     Heart sounds: Normal heart sounds.  Pulmonary:     Effort: Pulmonary effort is normal.     Breath sounds: Normal breath sounds.  Abdominal:     General: Bowel sounds are normal.     Palpations: Abdomen is soft.  Musculoskeletal:     Cervical back: Normal range of motion.  Lymphadenopathy:     Cervical: No cervical adenopathy.  Skin:    General: Skin is warm and dry.     Capillary Refill: Capillary refill takes less than 2 seconds.  Neurological:     Mental Status: She is alert and oriented to person, place, and time.  Psychiatric:        Behavior: Behavior normal.      Musculoskeletal Exam: C-spine thoracic and lumbar spine were in good range of motion.  Shoulder joints, elbow joints, wrist joints, MCPs PIPs and DIPs with good range of motion with no synovitis.  Hip joints, knee joints, ankles, MTPs and PIPs with good range of motion with no synovitis.  CDAI Exam: CDAI Score: -- Patient Global: --; Provider Global: -- Swollen: --; Tender: -- Joint Exam 04/18/2020   No joint exam has been documented for this visit   There is currently no information documented on the  homunculus. Go to the Rheumatology activity and complete the homunculus joint exam.  Investigation: No additional findings.  Imaging: No results found.  Recent Labs: Lab Results  Component Value Date   WBC 6.6 12/10/2019   HGB 14.5 12/10/2019   PLT 216 12/10/2019   NA 142 12/10/2019   K 4.9 12/10/2019   CL 103 12/10/2019   CO2 23 12/10/2019   GLUCOSE 102 (H) 12/10/2019   BUN 14 12/10/2019   CREATININE 0.60 12/10/2019   BILITOT 0.3 12/10/2019   ALKPHOS 68 12/10/2019   AST 16 12/10/2019   ALT 12 12/10/2019  PROT 7.4 12/10/2019   ALBUMIN 4.5 12/10/2019   CALCIUM 9.5 12/10/2019   GFRAA 115 12/10/2019    Speciality Comments: No specialty comments available.  Procedures:  No procedures performed Allergies: Patient has no known allergies.   Assessment / Plan:     Visit Diagnoses: Sjogren's syndrome with other organ involvement (HCC) - 12/01/19: ANA+, dsDNA-, RNP-, Sm-, Scl-70-, Ro 7.9, La-, Chromatin-, AntiJO1-, centromere-, RF<14, Hep B-, Hep C-, Hep A-, HIV- -patient gives history of dry mouth dry eyes and dry skin.  She states that her dry mouth and dry nose symptoms resolved after using the CPAP.  She continues to have mild dry eye symptoms.  She states she uses lotions which help with the dry skin.  Based on sicca symptoms and dry skin she does not need any additional treatment at this time.  Over-the-counter products were discussed.  Plan: Urinalysis, Routine w reflex microscopic, Sedimentation rate  Idiopathic lymphocytic interstitial pneumonitis (HCC) -patient had CT scan with contrast.  I do not see high-resolution CT.  Patient has been evaluated by Dr. Craige Cotta.  If Dr. Craige Cotta believes that the lung findings are related to Sjogren's then azathioprine can be tried.  I would like to wait until he makes his final decision after the work-up.  She may need high-resolution CT, PFTs and BAL. plan: Pan-ANCA  Myalgia -she complains of some muscle aches and pains for last many years.  She  had no muscular weakness on my examination.  Plan: CK  High risk medication use -in anticipation to start her on immunosuppressive therapy I will obtain following labs today.  Plan: QuantiFERON-TB Gold Plus, Serum protein electrophoresis with reflex, Thiopurine methyltransferase(tpmt)rbc, Glucose 6 phosphate dehydrogenase  Other sleep apnea-patient states she was diagnosed with sleep apnea by Dr. Craige Cotta and was placed on CPAP.  Her dry mouth and dry eye symptoms resolved after that.  She is feeling much better since she has been using CPAP.  BMI 40.0-44.9, adult (HCC)-I detailed discussion regarding the weight loss.  Have given her information on the weight loss program.  Chronic right-sided thoracic back pain-muscle spasms medial to right scapula and inferior to right scapula-she has chronic pain  New Onset- Essential hypertension-blood pressures are still elevated.  Have advised her to monitor blood pressure closely.  Mixed hyperlipidemia-weight loss diet and exercise was discussed.  Seborrheic dermatitis- ears, scalp  Dyshidrotic hand dermatitis-she did not have any dermatitis on examination today.  Other medical problems are listed as follows:  History of hypothyroidism  Vitamin D insufficiency  Prediabetes  History of diverticulosis  History of laparoscopic cholecystectomy  History of nephrolithiasis- not requiring surgery  History of smoking 30 pack years- Quit 9/ 1/ 96  Family history of suicide- father age 53  Family history of alcoholism in father; grandfathers etc  Orders: Orders Placed This Encounter  Procedures  . Urinalysis, Routine w reflex microscopic  . CK  . Sedimentation rate  . Pan-ANCA  . QuantiFERON-TB Gold Plus  . Serum protein electrophoresis with reflex  . Thiopurine methyltransferase(tpmt)rbc  . Glucose 6 phosphate dehydrogenase   No orders of the defined types were placed in this encounter.   .  Follow-Up Instructions: Return for  Sjogren's, ILP.   Pollyann Savoy, MD  Note - This record has been created using Animal nutritionist.  Chart creation errors have been sought, but may not always  have been located. Such creation errors do not reflect on  the standard of medical care.

## 2020-04-14 ENCOUNTER — Other Ambulatory Visit (HOSPITAL_COMMUNITY)
Admission: RE | Admit: 2020-04-14 | Discharge: 2020-04-14 | Disposition: A | Discharge status: Home / Self Care | Payer: BC Managed Care – PPO | Source: Ambulatory Visit | Attending: Pulmonary Disease | Admitting: Pulmonary Disease

## 2020-04-14 DIAGNOSIS — Z20822 Contact with and (suspected) exposure to covid-19: Secondary | ICD-10-CM | POA: Insufficient documentation

## 2020-04-14 DIAGNOSIS — Z01812 Encounter for preprocedural laboratory examination: Secondary | ICD-10-CM | POA: Diagnosis not present

## 2020-04-14 LAB — SARS CORONAVIRUS 2 (TAT 6-24 HRS): SARS Coronavirus 2: NEGATIVE

## 2020-04-18 ENCOUNTER — Other Ambulatory Visit: Payer: Self-pay

## 2020-04-18 ENCOUNTER — Ambulatory Visit: Payer: BC Managed Care – PPO | Admitting: Rheumatology

## 2020-04-18 ENCOUNTER — Encounter: Payer: Self-pay | Admitting: Rheumatology

## 2020-04-18 VITALS — BP 153/78 | HR 71 | Ht 64.0 in | Wt 254.6 lb

## 2020-04-18 DIAGNOSIS — Z8639 Personal history of other endocrine, nutritional and metabolic disease: Secondary | ICD-10-CM

## 2020-04-18 DIAGNOSIS — Z811 Family history of alcohol abuse and dependence: Secondary | ICD-10-CM

## 2020-04-18 DIAGNOSIS — Z8719 Personal history of other diseases of the digestive system: Secondary | ICD-10-CM

## 2020-04-18 DIAGNOSIS — E559 Vitamin D deficiency, unspecified: Secondary | ICD-10-CM

## 2020-04-18 DIAGNOSIS — M3509 Sicca syndrome with other organ involvement: Secondary | ICD-10-CM

## 2020-04-18 DIAGNOSIS — R7303 Prediabetes: Secondary | ICD-10-CM

## 2020-04-18 DIAGNOSIS — E782 Mixed hyperlipidemia: Secondary | ICD-10-CM

## 2020-04-18 DIAGNOSIS — Z818 Family history of other mental and behavioral disorders: Secondary | ICD-10-CM

## 2020-04-18 DIAGNOSIS — L301 Dyshidrosis [pompholyx]: Secondary | ICD-10-CM

## 2020-04-18 DIAGNOSIS — I10 Essential (primary) hypertension: Secondary | ICD-10-CM

## 2020-04-18 DIAGNOSIS — M546 Pain in thoracic spine: Secondary | ICD-10-CM

## 2020-04-18 DIAGNOSIS — Z87442 Personal history of urinary calculi: Secondary | ICD-10-CM

## 2020-04-18 DIAGNOSIS — Z9049 Acquired absence of other specified parts of digestive tract: Secondary | ICD-10-CM

## 2020-04-18 DIAGNOSIS — Z79899 Other long term (current) drug therapy: Secondary | ICD-10-CM

## 2020-04-18 DIAGNOSIS — Z6841 Body Mass Index (BMI) 40.0 and over, adult: Secondary | ICD-10-CM

## 2020-04-18 DIAGNOSIS — G4739 Other sleep apnea: Secondary | ICD-10-CM

## 2020-04-18 DIAGNOSIS — L219 Seborrheic dermatitis, unspecified: Secondary | ICD-10-CM

## 2020-04-18 DIAGNOSIS — M791 Myalgia, unspecified site: Secondary | ICD-10-CM | POA: Diagnosis not present

## 2020-04-18 DIAGNOSIS — G8929 Other chronic pain: Secondary | ICD-10-CM

## 2020-04-18 DIAGNOSIS — J842 Lymphoid interstitial pneumonia: Secondary | ICD-10-CM

## 2020-04-18 DIAGNOSIS — Z87891 Personal history of nicotine dependence: Secondary | ICD-10-CM

## 2020-04-19 ENCOUNTER — Encounter: Payer: Self-pay | Admitting: Pulmonary Disease

## 2020-04-19 ENCOUNTER — Other Ambulatory Visit: Payer: Self-pay

## 2020-04-19 ENCOUNTER — Ambulatory Visit: Payer: BC Managed Care – PPO | Admitting: Pulmonary Disease

## 2020-04-19 ENCOUNTER — Ambulatory Visit (INDEPENDENT_AMBULATORY_CARE_PROVIDER_SITE_OTHER): Payer: BC Managed Care – PPO | Admitting: Pulmonary Disease

## 2020-04-19 VITALS — BP 138/72 | HR 71 | Temp 97.3°F | Ht 64.0 in | Wt 252.2 lb

## 2020-04-19 DIAGNOSIS — J984 Other disorders of lung: Secondary | ICD-10-CM

## 2020-04-19 DIAGNOSIS — G4730 Sleep apnea, unspecified: Secondary | ICD-10-CM

## 2020-04-19 DIAGNOSIS — M3502 Sicca syndrome with lung involvement: Secondary | ICD-10-CM

## 2020-04-19 DIAGNOSIS — J849 Interstitial pulmonary disease, unspecified: Secondary | ICD-10-CM

## 2020-04-19 DIAGNOSIS — G4733 Obstructive sleep apnea (adult) (pediatric): Secondary | ICD-10-CM | POA: Diagnosis not present

## 2020-04-19 DIAGNOSIS — E669 Obesity, unspecified: Secondary | ICD-10-CM

## 2020-04-19 LAB — PULMONARY FUNCTION TEST
DL/VA % pred: 108 %
DL/VA: 4.58 ml/min/mmHg/L
DLCO cor % pred: 102 %
DLCO cor: 20.87 ml/min/mmHg
DLCO unc % pred: 102 %
DLCO unc: 20.87 ml/min/mmHg
FEF 25-75 Post: 2.37 L/sec
FEF 25-75 Pre: 1.6 L/sec
FEF2575-%Change-Post: 48 %
FEF2575-%Pred-Post: 98 %
FEF2575-%Pred-Pre: 66 %
FEV1-%Change-Post: 8 %
FEV1-%Pred-Post: 78 %
FEV1-%Pred-Pre: 72 %
FEV1-Post: 2.02 L
FEV1-Pre: 1.87 L
FEV1FVC-%Change-Post: 2 %
FEV1FVC-%Pred-Pre: 101 %
FEV6-%Change-Post: 5 %
FEV6-%Pred-Post: 76 %
FEV6-%Pred-Pre: 72 %
FEV6-Post: 2.47 L
FEV6-Pre: 2.35 L
FEV6FVC-%Change-Post: 0 %
FEV6FVC-%Pred-Post: 103 %
FEV6FVC-%Pred-Pre: 103 %
FVC-%Change-Post: 5 %
FVC-%Pred-Post: 74 %
FVC-%Pred-Pre: 70 %
FVC-Post: 2.48 L
FVC-Pre: 2.36 L
Post FEV1/FVC ratio: 82 %
Post FEV6/FVC ratio: 100 %
Pre FEV1/FVC ratio: 79 %
Pre FEV6/FVC Ratio: 100 %
RV % pred: 98 %
RV: 1.94 L
TLC % pred: 93 %
TLC: 4.72 L

## 2020-04-19 NOTE — Progress Notes (Signed)
[facility]  Chief Complaint  Patient presents with  . Follow-up    No complaints, follow up post PFT    Constitutional:  BP 138/72 (BP Location: Left Arm, Cuff Size: Normal)   Pulse 71   Temp (!) 97.3 F (36.3 C) (Other (Comment)) Comment (Src): wrist  Ht 5\' 4"  (1.626 m)   Wt 252 lb 3.2 oz (114.4 kg)   LMP 09/08/2014 (Within Days)   SpO2 98% Comment: Room air  BMI 43.29 kg/m   Past Medical History:  Diverticulosis, Eczema, Nephrolithiasis, HTN, HLD, Pre-DM, Varicose veins, Vit D deficiency  Past Surgical History:  Her  has a past surgical history that includes Cesarean section (x2  last one 1994); Foot surgery (1990s); vein strippiing (1990s); Incisional hernia repair (N/A, 09/15/2014); Colonoscopy (last one 03-21-2017); Cholecystectomy (07/21/2012); Cholecystectomy (07/21/2012); Extracorporeal shock wave lithotripsy (Right, 11/02/2018); and Cystoscopy/ureteroscopy/holmium laser/stent placement (Right, 10/13/2019).  Brief Summary:  Tiffany Glover is a 59 y.o. female with obstructive sleep apnea and lung cysts in the setting of Sjogren's syndrome.      Subjective:   She was seen by Dr. 46 with rheumatology on 04/18/20.  Her PFT today was normal.  She is not having any issues with her breathing.  She denies cough, wheeze, chest pain.  She feels her energy level is much better since she started CPAP.  No issues with mask fit.  She is working on weight reduction.  Physical Exam:   Appearance - well kempt   ENMT - no sinus tenderness, no oral exudate, no LAN, Mallampati 3 airway, no stridor  Respiratory - equal breath sounds bilaterally, no wheezing or rales  CV - s1s2 regular rate and rhythm, no murmurs  Ext - no clubbing, no edema  Skin - no rashes  Psych - normal mood and affect   Pulmonary testing:   Serology 12/01/19 >> positive ANA, SSA Ab 7.9.  PFT 04/19/20 >> FEV1 2.02 (78%), FEV1% 82, TLC 4.72 (93%), DLCO  102%, no BD  Chest Imaging:   CT chest 09/08/19 >> scattered cysts increased compared to imaging from 2013  Sleep Tests:   HST 01/12/20 >> AHI 28.5, SpO2 low 76%.  Auto CPAP 03/19/20 to 04/17/20 >> used on 30 of 30 nights with average 8 hrs 3 min.  Average AHI 2.3 with median CPAP 9 and 95 th percentile CPAP 11 cm H2O  Social History:  She  reports that she quit smoking about 25 years ago. Her smoking use included cigarettes. She has a 30.00 pack-year smoking history. She has never used smokeless tobacco. She reports previous alcohol use. She reports that she does not use drugs.  Family History:  Her family history includes Alcohol abuse in her father, maternal grandfather, and paternal grandfather; Breast cancer in her maternal grandmother; Cancer in her brother, maternal grandmother, and mother; Hyperlipidemia in her paternal grandmother; Hypertension in her paternal grandmother; Suicidality in her father; Urolithiasis in her son.    Discussion:  She has Sjogren's syndrome.  She has lung cysts on CT chest imaging which have been present since at least 2013.    Assessment/Plan:   Sjogren's syndrome with cystic lung lesions. - has positive ANA and SSA antibody - seen by Dr. 2014 with rheumatology - she does not have respiratory symptoms and pulmonary function testing normal - I think we can defer lung tissue sampling and therapy at this time - will continue close clinical monitoring with radiographic follow up and if there  is evidence for disease progression, then she will need lung tissue sampling (likely with VATS lung biopsy)  Obstructive sleep apnea. - she is compliant with CPAP and reports benefit from therapy - she uses [employer] for her DME - continue auto CPAP range 5 to 15 cm H2O  Obesity. - encouraged her to keep up with weight loss efforts  Time Spent Involved in Patient Care on Day of Examination:  34 minutes  Follow up:  Patient Instructions  Will schedule 6  minute walk test  Will arrange for high resolution CT chest for March 2022 and follow up after this   Medication List:   Allergies as of 04/19/2020   No Known Allergies     Medication List       Accurate as of April 19, 2020 10:29 AM. If you have any questions, ask your nurse or doctor.        acetaminophen 500 MG tablet Commonly known as: TYLENOL Take 1,000 mg by mouth every 6 (six) hours as needed.   cyclobenzaprine 10 MG tablet Commonly known as: FLEXERIL Take 1 tablet (10 mg total) by mouth 3 (three) times daily as needed for muscle spasms.   multivitamin tablet Take 1 tablet by mouth daily.   triamcinolone cream 0.5 % Commonly known as: KENALOG Apply 1 application topically 2 (two) times daily. To affected areas. What changed:   when to take this  reasons to take this  additional instructions   Vitamin D3 125 MCG (5000 UT) Tabs 5,000 IU OTC vitamin D3 daily. What changed:   how much to take  how to take this  when to take this       Signature:  Coralyn Helling, MD [facility] Pager - [phone] 04/19/2020, 10:29 AM

## 2020-04-19 NOTE — Progress Notes (Signed)
PFT done today. 

## 2020-04-19 NOTE — Patient Instructions (Signed)
Will schedule 6 minute walk test  Will arrange for high resolution CT chest for March 2022 and follow up after this

## 2020-05-02 LAB — PROTEIN ELECTROPHORESIS, SERUM, WITH REFLEX
Albumin ELP: 3.9 g/dL (ref 3.8–4.8)
Alpha 1: 0.3 g/dL (ref 0.2–0.3)
Alpha 2: 0.6 g/dL (ref 0.5–0.9)
Beta 2: 0.3 g/dL (ref 0.2–0.5)
Beta Globulin: 0.4 g/dL (ref 0.4–0.6)
Gamma Globulin: 1.1 g/dL (ref 0.8–1.7)
Total Protein: 6.7 g/dL (ref 6.1–8.1)

## 2020-05-02 LAB — URINALYSIS, ROUTINE W REFLEX MICROSCOPIC
Bilirubin Urine: NEGATIVE
Glucose, UA: NEGATIVE
Hgb urine dipstick: NEGATIVE
Ketones, ur: NEGATIVE
Leukocytes,Ua: NEGATIVE
Nitrite: NEGATIVE
Protein, ur: NEGATIVE
Specific Gravity, Urine: 1.021 (ref 1.001–1.03)
pH: 6.5 (ref 5.0–8.0)

## 2020-05-02 LAB — QUANTIFERON-TB GOLD PLUS
Mitogen-NIL: >10 IU/mL
NIL: 0.03 IU/mL
QuantiFERON-TB Gold Plus: NEGATIVE
TB1-NIL: 0 IU/mL
TB2-NIL: 0 IU/mL

## 2020-05-02 LAB — GLUCOSE 6 PHOSPHATE DEHYDROGENASE: G-6PDH: 16.4 U/g Hgb (ref 7.0–20.5)

## 2020-05-02 LAB — PAN-ANCA
ANCA Screen: NEGATIVE
Myeloperoxidase Abs: <1 AI
Serine Protease 3: <1 AI

## 2020-05-02 LAB — SEDIMENTATION RATE: Sed Rate: 6 mm/h (ref 0–30)

## 2020-05-02 LAB — THIOPURINE METHYLTRANSFERASE (TPMT), RBC: Thiopurine Methyltransferase, RBC: 12 nmol/hr/mL RBC — ABNORMAL LOW

## 2020-05-02 LAB — CK: Total CK: 90 U/L (ref 29–143)

## 2020-05-02 NOTE — Progress Notes (Signed)
I will discuss lab results at the follow-up visit.

## 2020-05-02 NOTE — Progress Notes (Signed)
Office Visit Note  Patient: Tiffany Glover             Date of Birth: [birth year]           MRN: [id number]             PCP: Lorrene Reid, PA-C Referring: Lorrene Reid, PA-C Visit Date: 05/16/2020 Occupation: _0 @  Subjective:  Dry mouth and dry eyes.   History of Present Illness: Tiffany Glover is a 59 y.o. female with history of Sjogren's and ILP.  She states that she continues to have dry mouth and dry eye symptoms.  She had thorough work-up by Dr. Halford Chessman including PFTs which were normal per Dr. Halford Chessman.  He recommended not to give her any immunosuppressant at this point.  She will have repeat CT scan for evaluation.  Her pulmonary symptoms have improved since she has been on CPAP.  She has not used any over-the-counter products for sicca symptoms yet.  She has enrolled in the weight loss program and will be starting it next year.  Activities of Daily Living:  Patient reports morning stiffness for none.   Patient Denies nocturnal pain.  Difficulty dressing/grooming: Denies Difficulty climbing stairs: Denies Difficulty getting out of chair: Denies Difficulty using hands for taps, buttons, cutlery, and/or writing: Denies  Review of Systems  Constitutional: Negative for fatigue, night sweats, weight gain and weight loss.  HENT: Positive for mouth dryness. Negative for mouth sores, trouble swallowing, trouble swallowing and nose dryness.   Eyes: Positive for dryness. Negative for pain, redness and visual disturbance.  Respiratory: Negative for cough, shortness of breath and difficulty breathing.   Cardiovascular: Positive for swelling in legs/feet. Negative for chest pain, palpitations, hypertension and irregular heartbeat.  Gastrointestinal: Negative for blood in stool, constipation and diarrhea.  Endocrine: Positive for excessive thirst. Negative for increased urination.  Genitourinary: Negative for difficulty urinating and vaginal dryness.  Musculoskeletal: Negative for  arthralgias, joint pain, joint swelling, myalgias, muscle weakness, morning stiffness, muscle tenderness and myalgias.  Skin: Negative for color change, rash, hair loss, skin tightness, ulcers and sensitivity to sunlight.  Allergic/Immunologic: Negative for susceptible to infections.  Neurological: Negative for dizziness, memory loss, night sweats and weakness.  Hematological: Negative for bruising/bleeding tendency and swollen glands.  Psychiatric/Behavioral: Negative for depressed mood and sleep disturbance. The patient is not nervous/anxious.     PMFS History:  Patient Active Problem List   Diagnosis Date Noted  . Physical deconditioning 09/11/2019  . Abnormal finding on radiology exam- possible emphasema findings on CT abd/pelvis by urology 09/11/2019  . Kidney stone 09/08/2019  . Asymptomatic microscopic hematuria 06/03/2018  . History of nephrolithiasis- not requiring surgery 06/03/2018  . Prediabetes 06/03/2018  . Chronic right-sided thoracic back pain-muscle spasms medial to right scapula and inferior to right scapula 03/18/2018  . Night muscle spasms-bilateral calves and hamstrings. 03/18/2018  . Glucose intolerance (impaired glucose tolerance) 03/18/2018  . Urine discoloration 03/18/2018  . Dehydration, mild 03/18/2018  . Chronic bilateral thoracic back pain 09/26/2017  . Myalgia 09/26/2017  . Plantar fascial fibromatosis of right foot 11/10/2016  . Muscle cramps in legs b/l 11/10/2016  . Salt craving 06/11/2016  . New Onset- Essential hypertension 06/01/2016  . Bilateral impacted cerumen 06/01/2016  . Person with feared health complaint in whom no diagnosis is made 06/01/2016  . Seborrheic dermatitis- ears, scalp 06/01/2016  . Counseling on health promotion and disease prevention 06/01/2016  . Vitamin D insufficiency 03/26/2016  . History of laparoscopic cholecystectomy 02/25/2016  .  History of smoking 30 pack years- Quit 9/ 1/ 96 02/25/2016  . Family history of suicide-  father age 89 02/25/2016  . Family history of alcoholism in father; grandfathers etc 02/25/2016  . 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  . Personal history of noncompliance with medical treatment and regimen 02/25/2016  . Elevated blood pressure reading without diagnosis of hypertension 02/25/2016  . Incisional hernia 09/15/2014  . Incisional hernia, without obstruction or gangrene 08/08/2014  . Allergic rhinitis/ seasonal allergies 05/17/2014  . DUB (dysfunctional uterine bleeding) 05/17/2014  . HLD (hyperlipidemia) 05/17/2014  . Elevated fasting blood sugar 05/17/2014  . Class 2 obesity in adult 05/17/2014  . IFG (impaired fasting glucose) 05/17/2014  . Menopausal symptoms 05/17/2014  . Shoulder joint pain 05/17/2014  . Morbid obesity ([place]) 09/14/2013  . h/o Hypothyroidism 09/04/2007  . h/o Hemorrhoids, internal, with bleeding 09/04/2007  . h/o Hemorrhoids, external 09/04/2007  . History of diverticulosis 09/04/2007    Past Medical History:  Diagnosis Date  . Complication of anesthesia    hard to wake up once  . Diverticulosis of colon   . Eczema   . Frequency of urination   . History of hypothyroidism    10-06-2019  per pt no medication needed in several yrs, lab work normal  . History of kidney stones   . Hypertension    10-06-2019 per pt her pcp stopped medication 12/ 2019 (note in epic)  due to pt lost weight and bp improved;  currently per pt she is monitoring bp at home for pcp  . Mixed hyperlipidemia   . Pre-diabetes    followed by pcp, watching diet  . Renal calculus, right   . Urgency of urination   . Varicose vein of leg   . Vitamin D deficiency     Family History  Problem Relation Age of Onset  . Cancer Mother        Lung  . Suicidality Father   . Alcohol abuse Father   . Cancer Brother        mouth  . Cancer Maternal Grandmother   . Breast cancer Maternal Grandmother   . Alcohol abuse Maternal Grandfather   . Urolithiasis Son   . Hyperlipidemia  Paternal Grandmother   . Hypertension Paternal Grandmother   . Alcohol abuse Paternal Grandfather   . Colon cancer Neg Hx   . Colon polyps Neg Hx   . Esophageal cancer Neg Hx   . Rectal cancer Neg Hx   . Stomach cancer Neg Hx    Past Surgical History:  Procedure Laterality Date  . CESAREAN SECTION  x2  last one 50  . CHOLECYSTECTOMY  07/21/2012   Procedure: LAPAROSCOPIC CHOLECYSTECTOMY WITH INTRAOPERATIVE CHOLANGIOGRAM;  Surgeon: Rolm Bookbinder, MD;  Location: [facility];  Service: General;  Laterality: N/A;  attempted  . CHOLECYSTECTOMY  07/21/2012   Procedure: CHOLECYSTECTOMY;  Surgeon: Rolm Bookbinder, MD;  Location: [facility];  Service: General;  Laterality: N/A;  . COLONOSCOPY  last one 03-21-2017  . CYSTOSCOPY/URETEROSCOPY/HOLMIUM LASER/STENT PLACEMENT Right 10/13/2019   Procedure: CYSTOSCOPY RIGHT RETROGRADE PYELOGRAM URETEROSCOPY/HOLMIUM LASER/STENT PLACEMENT;  Surgeon: Lucas Mallow, MD;  Location: [facility];  Service: Urology;  Laterality: Right;  . EXTRACORPOREAL SHOCK WAVE LITHOTRIPSY Right 11/02/2018   Procedure: EXTRACORPOREAL SHOCK WAVE LITHOTRIPSY (ESWL);  Surgeon: Lucas Mallow, MD;  Location: [facility];  Service: Urology;  Laterality: Right;  . FOOT SURGERY  1990s   ganglion cyst removed from right foot  . INCISIONAL HERNIA REPAIR N/A  09/15/2014   Procedure: LAPAROSCOPIC INCISIONAL HERNIA REPAIR WITH MESH;  Surgeon: Rolm Bookbinder, MD;  Location: [facility];  Service: General;  Laterality: N/A;  . vein strippiing  1990s   left leg   Social History   Social History Narrative  . Not on file   Immunization History  Administered Date(s) Administered  . PPD Test 12/09/2011     Objective: Vital Signs: BP (!) 165/78 (BP Location: Left Arm, Patient Position: Sitting, Cuff Size: Normal)   Pulse 76   Resp 15   Ht _0  (1.626 m)   Wt 263 lb 6.4 oz (119.5 kg)   LMP 09/08/2014 (Within Days)   BMI 45.21 kg/m    Physical Exam Vitals and nursing  note reviewed.  Constitutional:      Appearance: She is well-developed.  HENT:     Head: Normocephalic and atraumatic.  Eyes:     Conjunctiva/sclera: Conjunctivae normal.  Cardiovascular:     Rate and Rhythm: Normal rate and regular rhythm.     Heart sounds: Normal heart sounds.  Pulmonary:     Effort: Pulmonary effort is normal.     Breath sounds: Normal breath sounds.  Abdominal:     General: Bowel sounds are normal.     Palpations: Abdomen is soft.  Musculoskeletal:     Cervical back: Normal range of motion.  Lymphadenopathy:     Cervical: No cervical adenopathy.  Skin:    General: Skin is warm and dry.     Capillary Refill: Capillary refill takes less than 2 seconds.  Neurological:     Mental Status: She is alert and oriented to person, place, and time.  Psychiatric:        Behavior: Behavior normal.      Musculoskeletal Exam: C-spine thoracic and lumbar spine were in good range of motion.  Shoulder joints, elbow joints, wrist joints, MCPs PIPs and DIPs with good range of motion with no synovitis.  Hip joints, knee joints, ankles, MTPs and PIPs with good range of motion with no synovitis.  CDAI Exam: CDAI Score: -- Patient Global: --; Provider Global: -- Swollen: --; Tender: -- Joint Exam 05/16/2020   No joint exam has been documented for this visit   There is currently no information documented on the homunculus. Go to the Rheumatology activity and complete the homunculus joint exam.  Investigation: No additional findings.  Imaging: No results found.  Recent Labs: Lab Results  Component Value Date   WBC 6.6 12/10/2019   HGB 14.5 12/10/2019   PLT 216 12/10/2019   NA 142 12/10/2019   K 4.9 12/10/2019   CL 103 12/10/2019   CO2 23 12/10/2019   GLUCOSE 102 (H) 12/10/2019   BUN 14 12/10/2019   CREATININE 0.60 12/10/2019   BILITOT 0.3 12/10/2019   ALKPHOS 68 12/10/2019   AST 16 12/10/2019   ALT 12 12/10/2019   PROT 6.7 04/18/2020   ALBUMIN 4.5  12/10/2019   CALCIUM 9.5 12/10/2019   GFRAA 115 12/10/2019   QFTBGOLDPLUS NEGATIVE 04/18/2020   April 18, 2020 UA negative, SPEP normal, TB Gold negative, ANCA negative, MPO negative, serine proteinase 3 -, CK 90, ESR 6, G6PD normal, TPMT 12 (low metabolizer)  Speciality Comments: No specialty comments available.  Procedures:  No procedures performed Allergies: Patient has no known allergies.   Assessment / Plan:     Visit Diagnoses: Sjogren's syndrome with other organ involvement ([place]) - Positive ANA, positive Ro, dry mouth, dry eyes and dry skin.  We had  detailed discussion regarding Sjogren's.  Autoimmune nature of the Sjogren's disease was discussed.  She continues to have dry mouth and dry eyes.  Several over-the-counter products were discussed.  I also discussed possible use of Plaquenil or Imuran.  Patient declined any immunosuppressive therapy at this point.  I discussed use of pilocarpine.  Indications side effects contraindications were discussed.  She was given pilocarpine 5 mg p.o. 3 times daily as needed.  Idiopathic lymphocytic interstitial pneumonitis (HCC) - Evaluated by Dr. Halford Chessman.  He did not recommend treatment at this time.  I had discussion with Dr. Halford Chessman.  He mentioned in the staff message that after being on CPAP her symptoms have improved a lot.  He plans to repeat CT scan of her chest and will make decision if she needs immunosuppressive therapy in the future.  Her PFTs were normal.  Her lungs were clear to auscultation today.  Myalgia - History of muscle pain for many years.  CK is normal.  High risk medication use-all the screening tests were normal.  At this time we will hold off for Plaquenil or Imuran.  Chronic right-sided thoracic back pain-muscle spasms medial to right scapula and inferior to right scapula  New Onset- Essential hypertension-have advised her to monitor blood pressure closely.  I also discussed that and the Ro antibody could be associated with  arrhythmias.  She will need a baseline EKG.  She will discuss that further with her PCP.  Mixed hyperlipidemia-weight loss diet and exercise was discussed.  She will be joining the weight loss clinic.  Seborrheic dermatitis- ears, scalp  Dyshidrotic hand dermatitis  History of hypothyroidism  Vitamin D insufficiency  Prediabetes  History of diverticulosis  History of laparoscopic cholecystectomy  History of nephrolithiasis- not requiring surgery  History of smoking 30 pack years- Quit 9/ 1/ 96  Family history of suicide- father age 78  Family history of alcoholism in father; grandfathers etc  BMI 40.0-44.9, adult (HCC)-patient will be joining weight loss clinic.  Other sleep apnea - She is on CPAP.  Orders: No orders of the defined types were placed in this encounter.  No orders of the defined types were placed in this encounter.    Follow-Up Instructions: Return for Sjogren's.   Bo Merino, MD  Note - This record has been created using Editor, commissioning.  Chart creation errors have been sought, but may not always  have been located. Such creation errors do not reflect on  the standard of medical care.

## 2020-05-09 ENCOUNTER — Ambulatory Visit: Payer: BC Managed Care – PPO | Admitting: Rheumatology

## 2020-05-16 ENCOUNTER — Other Ambulatory Visit: Payer: Self-pay

## 2020-05-16 ENCOUNTER — Encounter: Payer: Self-pay | Admitting: Rheumatology

## 2020-05-16 ENCOUNTER — Ambulatory Visit: Payer: BC Managed Care – PPO | Admitting: Rheumatology

## 2020-05-16 VITALS — BP 165/78 | HR 76 | Resp 15 | Ht 64.0 in | Wt 263.4 lb

## 2020-05-16 DIAGNOSIS — G8929 Other chronic pain: Secondary | ICD-10-CM

## 2020-05-16 DIAGNOSIS — M791 Myalgia, unspecified site: Secondary | ICD-10-CM

## 2020-05-16 DIAGNOSIS — E782 Mixed hyperlipidemia: Secondary | ICD-10-CM

## 2020-05-16 DIAGNOSIS — G4739 Other sleep apnea: Secondary | ICD-10-CM

## 2020-05-16 DIAGNOSIS — Z79899 Other long term (current) drug therapy: Secondary | ICD-10-CM | POA: Diagnosis not present

## 2020-05-16 DIAGNOSIS — I10 Essential (primary) hypertension: Secondary | ICD-10-CM

## 2020-05-16 DIAGNOSIS — Z818 Family history of other mental and behavioral disorders: Secondary | ICD-10-CM

## 2020-05-16 DIAGNOSIS — L301 Dyshidrosis [pompholyx]: Secondary | ICD-10-CM

## 2020-05-16 DIAGNOSIS — E559 Vitamin D deficiency, unspecified: Secondary | ICD-10-CM

## 2020-05-16 DIAGNOSIS — Z6841 Body Mass Index (BMI) 40.0 and over, adult: Secondary | ICD-10-CM

## 2020-05-16 DIAGNOSIS — Z8719 Personal history of other diseases of the digestive system: Secondary | ICD-10-CM

## 2020-05-16 DIAGNOSIS — M3509 Sicca syndrome with other organ involvement: Secondary | ICD-10-CM

## 2020-05-16 DIAGNOSIS — Z811 Family history of alcohol abuse and dependence: Secondary | ICD-10-CM

## 2020-05-16 DIAGNOSIS — L219 Seborrheic dermatitis, unspecified: Secondary | ICD-10-CM

## 2020-05-16 DIAGNOSIS — R7303 Prediabetes: Secondary | ICD-10-CM

## 2020-05-16 DIAGNOSIS — Z9049 Acquired absence of other specified parts of digestive tract: Secondary | ICD-10-CM

## 2020-05-16 DIAGNOSIS — M546 Pain in thoracic spine: Secondary | ICD-10-CM

## 2020-05-16 DIAGNOSIS — J842 Lymphoid interstitial pneumonia: Secondary | ICD-10-CM | POA: Diagnosis not present

## 2020-05-16 DIAGNOSIS — Z87442 Personal history of urinary calculi: Secondary | ICD-10-CM

## 2020-05-16 DIAGNOSIS — Z8639 Personal history of other endocrine, nutritional and metabolic disease: Secondary | ICD-10-CM

## 2020-05-16 DIAGNOSIS — Z87891 Personal history of nicotine dependence: Secondary | ICD-10-CM

## 2020-05-16 MED ORDER — PILOCARPINE HCL 5 MG PO TABS: 5.0000 mg | ORAL_TABLET | Freq: Three times a day (TID) | ORAL | 2 refills | Status: DC | PRN

## 2020-06-01 ENCOUNTER — Ambulatory Visit: Payer: BC Managed Care – PPO | Admitting: Physician Assistant

## 2020-07-20 ENCOUNTER — Ambulatory Visit: Payer: BC Managed Care – PPO | Admitting: Physician Assistant

## 2020-07-29 ENCOUNTER — Encounter (INDEPENDENT_AMBULATORY_CARE_PROVIDER_SITE_OTHER): Payer: Self-pay

## 2020-08-30 ENCOUNTER — Other Ambulatory Visit: Payer: Self-pay

## 2020-08-30 ENCOUNTER — Ambulatory Visit: Payer: BC Managed Care – PPO | Admitting: Physician Assistant

## 2020-08-30 ENCOUNTER — Encounter: Payer: Self-pay | Admitting: Physician Assistant

## 2020-08-30 VITALS — BP 137/61 | HR 74 | Temp 96.8°F | Ht 64.0 in | Wt 262.5 lb

## 2020-08-30 DIAGNOSIS — R7303 Prediabetes: Secondary | ICD-10-CM

## 2020-08-30 DIAGNOSIS — E785 Hyperlipidemia, unspecified: Secondary | ICD-10-CM | POA: Diagnosis not present

## 2020-08-30 DIAGNOSIS — M3509 Sicca syndrome with other organ involvement: Secondary | ICD-10-CM | POA: Diagnosis not present

## 2020-08-30 DIAGNOSIS — J849 Interstitial pulmonary disease, unspecified: Secondary | ICD-10-CM

## 2020-08-30 DIAGNOSIS — I10 Essential (primary) hypertension: Secondary | ICD-10-CM

## 2020-08-30 DIAGNOSIS — E039 Hypothyroidism, unspecified: Secondary | ICD-10-CM | POA: Diagnosis not present

## 2020-08-30 NOTE — Patient Instructions (Signed)

## 2020-08-30 NOTE — Progress Notes (Signed)
Established Patient Office Visit  Subjective:  Patient ID: Tiffany Glover, female    DOB: 05/04/1961  Age: 60 y.o. MRN: [id number]  CC:  Chief Complaint  Patient presents with  . Hyperlipidemia       . Hypertension    HPI Tiffany Glover presents for follow-up on hypertension and hyperlipidemia.  HTN: Pt denies chest pain, palpitations, dizziness or headache. States left ankle edema has improved. Patient reports she was on antihypertensive medication in the past which was discontinued due to having low blood pressures. Has not been checking blood pressure at home recently. Pt follows a low salt diet. Trying to increase water hydration.  HLD: Pt trying to manage with diet and lifestyle changes.  Reports about 2 months ago changed to a plant-based diet and has eliminated dairy.  Sjogren's syndrome: Patient followed by Rheumatology, Dr. Corliss Skains. Reports Dr. Corliss Skains recommended to try plant based diet to reduce inflammation and patient has noticed some improvement with myalgias/arthralgias.    Past Medical History:  Diagnosis Date  . Complication of anesthesia    hard to wake up once  . Diverticulosis of colon   . Eczema   . Frequency of urination   . History of hypothyroidism    10-06-2019  per pt no medication needed in several yrs, lab work normal  . History of kidney stones   . Hypertension    10-06-2019 per pt her pcp stopped medication 12/ 2019 (note in epic)  due to pt lost weight and bp improved;  currently per pt she is monitoring bp at home for pcp  . Mixed hyperlipidemia   . Pre-diabetes    followed by pcp, watching diet  . Renal calculus, right   . Urgency of urination   . Varicose vein of leg   . Vitamin D deficiency     Past Surgical History:  Procedure Laterality Date  . CESAREAN SECTION  x2  last one 90  . CHOLECYSTECTOMY  07/21/2012   Procedure: LAPAROSCOPIC CHOLECYSTECTOMY WITH INTRAOPERATIVE CHOLANGIOGRAM;  Surgeon: Emelia Loron, MD;   Location: [facility];  Service: General;  Laterality: N/A;  attempted  . CHOLECYSTECTOMY  07/21/2012   Procedure: CHOLECYSTECTOMY;  Surgeon: Emelia Loron, MD;  Location: [facility];  Service: General;  Laterality: N/A;  . COLONOSCOPY  last one 03-21-2017  . CYSTOSCOPY/URETEROSCOPY/HOLMIUM LASER/STENT PLACEMENT Right 10/13/2019   Procedure: CYSTOSCOPY RIGHT RETROGRADE PYELOGRAM URETEROSCOPY/HOLMIUM LASER/STENT PLACEMENT;  Surgeon: Crista Elliot, MD;  Location: [facility];  Service: Urology;  Laterality: Right;  . EXTRACORPOREAL SHOCK WAVE LITHOTRIPSY Right 11/02/2018   Procedure: EXTRACORPOREAL SHOCK WAVE LITHOTRIPSY (ESWL);  Surgeon: Crista Elliot, MD;  Location: [facility];  Service: Urology;  Laterality: Right;  . FOOT SURGERY  1990s   ganglion cyst removed from right foot  . INCISIONAL HERNIA REPAIR N/A 09/15/2014   Procedure: LAPAROSCOPIC INCISIONAL HERNIA REPAIR WITH MESH;  Surgeon: Emelia Loron, MD;  Location: [facility];  Service: General;  Laterality: N/A;  . vein strippiing  1990s   left leg    Family History  Problem Relation Age of Onset  . Cancer Mother        Lung  . Suicidality Father   . Alcohol abuse Father   . Cancer Brother        mouth  . Cancer Maternal Grandmother   . Breast cancer Maternal Grandmother   . Alcohol abuse Maternal Grandfather   . Urolithiasis Son   . Hyperlipidemia Paternal Grandmother   .  Hypertension Paternal Grandmother   . Alcohol abuse Paternal Grandfather   . Colon cancer Neg Hx   . Colon polyps Neg Hx   . Esophageal cancer Neg Hx   . Rectal cancer Neg Hx   . Stomach cancer Neg Hx     Social History   Socioeconomic History  . Marital status: Married    Spouse name: Not on file  . Number of children: 2  . Years of education: Not on file  . Highest education level: Not on file  Occupational History  . Occupation: Radio broadcast assistant: [employer]  Tobacco Use  . Smoking status: Former Smoker     Packs/day: 1.50    Years: 20.00    Pack years: 30.00    Types: Cigarettes    Quit date: 03/02/1995    Years since quitting: 25.5  . Smokeless tobacco: Never Used  Vaping Use  . Vaping Use: Never used  Substance and Sexual Activity  . Alcohol use: Yes    Comment: very rare  . Drug use: Never  . Sexual activity: Not on file  Other Topics Concern  . Not on file  Social History Narrative  . Not on file   Social Determinants of Health   Financial Resource Strain: Not on file  Food Insecurity: Not on file  Transportation Needs: Not on file  Physical Activity: Not on file  Stress: Not on file  Social Connections: Not on file  Intimate Partner Violence: Not on file    Outpatient Medications Prior to Visit  Medication Sig Dispense Refill  . acetaminophen (TYLENOL) 500 MG tablet Take 1,000 mg by mouth every 6 (six) hours as needed.    . Cholecalciferol (VITAMIN D3) 5000 units TABS 5,000 IU OTC vitamin D3 daily. (Patient taking differently: Take 1 tablet by mouth daily. 5,000 IU OTC vitamin D3 daily.) 90 tablet 3  . cyclobenzaprine (FLEXERIL) 10 MG tablet Take 1 tablet (10 mg total) by mouth 3 (three) times daily as needed for muscle spasms. 30 tablet 0  . hydrocortisone (ANUSOL-HC) 2.5 % rectal cream as needed.    Marland Kitchen ketoconazole (NIZORAL) 2 % cream Apply topically as needed.    . Multiple Vitamin (MULTIVITAMIN) tablet Take 1 tablet by mouth daily.    . pilocarpine (SALAGEN) 5 MG tablet Take 1 tablet (5 mg total) by mouth 3 (three) times daily as needed. 90 tablet 2  . triamcinolone cream (KENALOG) 0.5 % Apply 1 application topically 2 (two) times daily. To affected areas. (Patient taking differently: Apply 1 application topically 2 (two) times daily as needed. To affected areas as needed) 60 g 1   No facility-administered medications prior to visit.    No Known Allergies  ROS Review of Systems A fourteen system review of systems was performed and found to be positive as per HPI.    Objective:    Physical Exam General:  Well Developed, well nourished, in no acute distress  Neuro:  Alert and oriented,  extra-ocular muscles intact  HEENT:  Normocephalic, atraumatic, neck supple Skin:  no gross rash, warm, pink. Cardiac:  RRR, S1 S2 wn l's, no murmur  Respiratory:  ECTA B/L w/o wheezing, Not using accessory muscles, speaking in full sentences- unlabored. Vascular:  Ext warm, no cyanosis apprec.; cap RF less 2 sec. Psych:  No HI/SI, judgement and insight good, Euthymic mood. Full Affect.   BP 137/61   Pulse 74   Temp (!) 96.8 F (36 C)   Ht  5\' 4"  (1.626 m)   Wt 262 lb 8 oz (119.1 kg)   LMP 09/08/2014 (Within Days)   SpO2 99%   BMI 45.06 kg/m  Wt Readings from Last 3 Encounters:  08/30/20 262 lb 8 oz (119.1 kg)  05/16/20 263 lb 6.4 oz (119.5 kg)  04/19/20 252 lb 3.2 oz (114.4 kg)     Health Maintenance Due  Topic Date Due  . COVID-19 Vaccine (1) Never done  . TETANUS/TDAP  Never done  . PAP SMEAR-Modifier  08/27/2020    There are no preventive care reminders to display for this patient.  Lab Results  Component Value Date   TSH 3.100 12/10/2019   Lab Results  Component Value Date   WBC 6.6 12/10/2019   HGB 14.5 12/10/2019   HCT 45.4 12/10/2019   MCV 90 12/10/2019   PLT 216 12/10/2019   Lab Results  Component Value Date   NA 142 12/10/2019   K 4.9 12/10/2019   CO2 23 12/10/2019   GLUCOSE 102 (H) 12/10/2019   BUN 14 12/10/2019   CREATININE 0.60 12/10/2019   BILITOT 0.3 12/10/2019   ALKPHOS 68 12/10/2019   AST 16 12/10/2019   ALT 12 12/10/2019   PROT 6.7 04/18/2020   ALBUMIN 4.5 12/10/2019   CALCIUM 9.5 12/10/2019   ANIONGAP 8 10/28/2018   Lab Results  Component Value Date   CHOL 171 12/10/2019   Lab Results  Component Value Date   HDL 70 12/10/2019   Lab Results  Component Value Date   LDLCALC 92 12/10/2019   Lab Results  Component Value Date   TRIG 43 12/10/2019   Lab Results  Component Value Date   CHOLHDL 2.4  12/10/2019   Lab Results  Component Value Date   HGBA1C 5.7 (H) 12/10/2019      Assessment & Plan:   Problem List Items Addressed This Visit      Cardiovascular and Mediastinum   New Onset- Essential hypertension - Primary (Chronic)     Endocrine   h/o Hypothyroidism (Chronic)     Other   HLD (hyperlipidemia) (Chronic)    Other Visit Diagnoses    Sjogren's syndrome with other organ involvement (HCC)       Interstitial lung disease (HCC)         Sjogren's syndrome with other organ involvement: -Followed by rheumatology. -On pilocarpine. -Reviewed last rheumatology consult and Dr. 02/09/2020 recommended obtaining baseline EKG due to potential arrhythmias with positive anti-Ro so will obtain EKG with CPE. Of note, patient had EKG in 10/13/2019. -Encouraged to continue with plant-based diet.  Interstitial lung diease: -Followed by pulmonology. -Reviewed last consult note.  Tissue sampling and therapy deferred, patient asymptomatic and PFT normal.  Pulmonology will continue to monitor.  New onset- Essential hypertension: -Suboptimal controlled. -Recommend to continue with weight loss efforts, increase water hydration and follow low-sodium diet. -Will continue to monitor and consider restarting antihypertensive medication if BP consistently above 135/85.  Hyperlipidemia: -Last lipid panel: total cholesterol 171, triglycerides 43, HDL 70, LDL 92 -Will repeat lipid panel with CPE. Patient has eliminated dairy from diet which will help improve LDL.  -Recommend to continue with reducing saturated and trans fats, continue to stay as active as possible.  H/o of Hypothyroidism: -Last TSH 3.100, wnl. -Will repeat TSH with CPE.  No orders of the defined types were placed in this encounter.   Follow-up: Return in about 4 months (around 12/30/2020) for CPE and FBW 1 wk prior .   Note:  This note was prepared with assistance of Conservation officer, historic buildingsDragon voice recognition software. Occasional  wrong-word or sound-a-like substitutions may have occurred due to the inherent limitations of voice recognition software.   Mayer MaskerMaritza Heiley Shaikh, PA-C

## 2020-09-07 ENCOUNTER — Ambulatory Visit (INDEPENDENT_AMBULATORY_CARE_PROVIDER_SITE_OTHER): Payer: BC Managed Care – PPO

## 2020-09-07 ENCOUNTER — Other Ambulatory Visit: Payer: Self-pay

## 2020-09-07 DIAGNOSIS — J849 Interstitial pulmonary disease, unspecified: Secondary | ICD-10-CM | POA: Diagnosis not present

## 2020-09-07 NOTE — Progress Notes (Signed)
Six Minute Walk - 09/07/20 1205      Six Minute Walk   Medications taken before test (dose and time) no meds taken    Supplemental oxygen during test? No    Lap distance in meters  34 meters    Laps Completed 13    Partial lap (in meters) 0 meters    Baseline BP (sitting) 126/70    Baseline Heartrate 70    Baseline Dyspnea (Borg Scale) 0    Baseline Fatigue (Borg Scale) 1    Baseline SPO2 100 %      End of Test Values    BP (sitting) 140/90    Heartrate 94    Dyspnea (Borg Scale) 2    Fatigue (Borg Scale) 1    SPO2 100 %      2 Minutes Post Walk Values   BP (sitting) 134/88    Heartrate 73    SPO2 100 %    Stopped or paused before six minutes? No      Interpretation   Distance completed 442 meters    Tech Comments: Pt walked at an average pace completing all 6 min without stopping. Pt denied any complaints.

## 2020-09-12 ENCOUNTER — Other Ambulatory Visit: Payer: Self-pay | Admitting: Rheumatology

## 2020-09-12 NOTE — Telephone Encounter (Signed)
Next Visit: message sen to front desk to schedule appt, Return in about 3 months (around 08/16/2020) for Sjogren's.     Last Visit: 05/16/2020   Last Fill:05/16/2020  Dx: Sjogren's syndrome with other organ involvement   Current Dose per office note on 05/16/2020, pilocarpine 5 mg p.o. 3 times daily as needed.  Okay to refill pilocarpine?

## 2020-09-12 NOTE — Telephone Encounter (Signed)
Please call patient to schedule appt, thank you.   Return in about 3 months (around 08/16/2020) for Sjogren's.

## 2020-09-12 NOTE — Telephone Encounter (Signed)
LMOM to schedule 3 month follow-up appointment. °

## 2020-09-18 ENCOUNTER — Other Ambulatory Visit: Payer: Self-pay

## 2020-09-18 ENCOUNTER — Ambulatory Visit (INDEPENDENT_AMBULATORY_CARE_PROVIDER_SITE_OTHER)
Admission: RE | Admit: 2020-09-18 | Discharge: 2020-09-18 | Disposition: A | Discharge status: Home / Self Care | Payer: BC Managed Care – PPO | Source: Ambulatory Visit | Attending: Pulmonary Disease | Admitting: Pulmonary Disease

## 2020-09-18 DIAGNOSIS — J849 Interstitial pulmonary disease, unspecified: Secondary | ICD-10-CM

## 2020-09-21 NOTE — Progress Notes (Signed)
Called and left message on voicemail to please return phone call to go over CT results. Contact number provided.

## 2020-09-21 NOTE — Progress Notes (Signed)
Patient returned call, confirmed name and birth date and went over CT results per Dr Craige Cotta with patient. All questions answered and patient expressed full understanding. Confirmed upcoming scheduled office visit on 10/31/2020 at 9:30am. Nothing further needed at this time.

## 2020-10-03 ENCOUNTER — Ambulatory Visit: Payer: Self-pay | Admitting: Pulmonary Disease

## 2020-10-15 ENCOUNTER — Other Ambulatory Visit: Payer: Self-pay | Admitting: Physician Assistant

## 2020-10-16 NOTE — Telephone Encounter (Signed)
Next Visit: 11/21/2020  Last Visit: 05/16/2020   Last Fill: 09/12/2020  Dx: Sjogren's syndrome with other organ involvement  Current Dose per office note on 05/16/2020, pilocarpine 5 mg p.o. 3 times daily as needed.  Okay to refill Pilocarpine?

## 2020-10-31 ENCOUNTER — Other Ambulatory Visit: Payer: Self-pay

## 2020-10-31 ENCOUNTER — Encounter: Payer: Self-pay | Admitting: Pulmonary Disease

## 2020-10-31 ENCOUNTER — Ambulatory Visit: Payer: BC Managed Care – PPO | Admitting: Pulmonary Disease

## 2020-10-31 VITALS — BP 124/72 | HR 65 | Temp 97.4°F | Ht 64.0 in | Wt 256.2 lb

## 2020-10-31 DIAGNOSIS — M3502 Sicca syndrome with lung involvement: Secondary | ICD-10-CM

## 2020-10-31 DIAGNOSIS — J984 Other disorders of lung: Secondary | ICD-10-CM

## 2020-10-31 DIAGNOSIS — G4730 Sleep apnea, unspecified: Secondary | ICD-10-CM

## 2020-10-31 DIAGNOSIS — G4733 Obstructive sleep apnea (adult) (pediatric): Secondary | ICD-10-CM | POA: Diagnosis not present

## 2020-10-31 DIAGNOSIS — E669 Obesity, unspecified: Secondary | ICD-10-CM

## 2020-10-31 DIAGNOSIS — J849 Interstitial pulmonary disease, unspecified: Secondary | ICD-10-CM

## 2020-10-31 NOTE — Progress Notes (Signed)
[name]  Chief Complaint  Patient presents with  . Follow-up    No complaints currently    Constitutional:  BP 124/72 (BP Location: Right Arm, Cuff Size: Large)   Pulse 65   Temp (!) 97.4 F (36.3 C) (Temporal)   Ht 5\' 4"  (1.626 m)   Wt 256 lb 3.2 oz (116.2 kg)   LMP 09/08/2014 (Within Days)   SpO2 99% Comment: Room air  BMI 43.98 kg/m   Past Medical History:  Diverticulosis, Eczema, Nephrolithiasis, HTN, HLD, Pre-DM, Varicose veins, Vit D deficiency  Past Surgical History:  Her  has a past surgical history that includes Cesarean section (x2  last one 1994); Foot surgery (1990s); vein strippiing (1990s); Incisional hernia repair (N/A, 09/15/2014); Colonoscopy (last one 03-21-2017); Cholecystectomy (07/21/2012); Cholecystectomy (07/21/2012); Extracorporeal shock wave lithotripsy (Right, 11/02/2018); and Cystoscopy/ureteroscopy/holmium laser/stent placement (Right, 10/13/2019).  Brief Summary:  Tiffany Glover is a 60 y.o. female former smoker with obstructive sleep apnea and lung cysts in the setting of Sjogren's syndrome.      Subjective:   She had high resolution CT chest in March 2022.  No change compare to March 2021.  She is walking about 20 minutes per day.  She will feel her breathing get heavy after walking about 10 minutes a brisk pace.  She recovers after resting for a few minutes.  She isn't having cough, wheeze, sputum, or chest tightness.  Doesn't feel like breathing interferes with her usual activity.  She reports getting frequent episodes of respiratory infections and pneumonia when she still smoked cigarettes.  Using CPAP nightly.  This has helped a lot.  No issues with mask fit.   Physical Exam:   Appearance - well kempt   ENMT - no sinus tenderness, no oral exudate, no LAN, Mallampati 3 airway, no stridor  Respiratory - equal breath sounds bilaterally, no wheezing or rales  CV - s1s2 regular rate and rhythm,  no murmurs  Ext - no clubbing, no edema  Skin - no rashes  Psych - normal mood and affect   Pulmonary testing:   Serology 12/01/19 >> positive ANA, SSA Ab 7.9.  IgG/IgA/IgM 12/01/19 >> normal  A1AT 12/01/19 >> 146, MM  PFT 04/19/20 >> FEV1 2.02 (78%), FEV1% 82, TLC 4.72 (93%), DLCO 102%, no BD  Chest Imaging:   CT chest 09/08/19 >> scattered cysts increased compared to imaging from 2013  HRCT chest 09/18/20 >> diffuse bronchial wall thickening, numerous thin walled cysts stable since 2021, minimal subpleural reticular opacity and GGO at Rt lung base unchanged  Sleep Tests:   HST 01/12/20 >> AHI 28.5, SpO2 low 76%.  Auto CPAP 09/30/20 to 10/29/20 >> used on 30 of 30 nights with average 7 hrs 49 min.  Average AHI 1.8 with median CPAP 9 and 95 th percentile CPAP 11 cm H2O  Social History:  She  reports that she quit smoking about 25 years ago. Her smoking use included cigarettes. She has a 30.00 pack-year smoking history. She has never used smokeless tobacco. She reports current alcohol use. She reports that she does not use drugs.  Family History:  Her family history includes Alcohol abuse in her father, maternal grandfather, and paternal grandfather; Breast cancer in her maternal grandmother; Cancer in her brother, maternal grandmother, and mother; Hyperlipidemia in her paternal grandmother; Hypertension in her paternal grandmother; Suicidality in her father; Urolithiasis in her son.      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 - these have remained stable on CT chest - she has minimal respiratory symptoms - difficult to determine if these are post infectious versus related to Sjogren's syndrome  - will repeat pulmonary function test, and if normal then continue clinical monitoring  Sjogren's syndrome. - has positive ANA and SSA antibody - seen by Dr. Corliss Skains with rheumatology  Obstructive sleep apnea. - she is compliant with CPAP and reports benefit from  therapy - she uses [employer] for her DME - continue auto CPAP 5 to 15 cm H2O  Obesity. - encouraged her to keep up with weight loss efforts  Time Spent Involved in Patient Care on Day of Examination:  33 minutes  Follow up:  Patient Instructions  Will arrange for pulmonary function test and call with results  Follow up in 6 months   Medication List:   Allergies as of 10/31/2020   No Known Allergies     Medication List       Accurate as of Oct 31, 2020  1:08 PM. If you have any questions, ask your nurse or doctor.        acetaminophen 500 MG tablet Commonly known as: TYLENOL Take 1,000 mg by mouth every 6 (six) hours as needed.   cyclobenzaprine 10 MG tablet Commonly known as: FLEXERIL Take 1 tablet (10 mg total) by mouth 3 (three) times daily as needed for muscle spasms.   hydrocortisone 2.5 % rectal cream Commonly known as: ANUSOL-HC as needed.   ketoconazole 2 % cream Commonly known as: NIZORAL Apply topically as needed.   multivitamin tablet Take 1 tablet by mouth daily.   pilocarpine 5 MG tablet Commonly known as: SALAGEN TAKE 1 TABLET BY MOUTH THREE TIMES A DAY AS NEEDED   triamcinolone cream 0.5 % Commonly known as: KENALOG Apply 1 application topically 2 (two) times daily. To affected areas. What changed:   when to take this  reasons to take this  additional instructions   Vitamin D3 125 MCG (5000 UT) Tabs 5,000 IU OTC vitamin D3 daily. What changed:   how much to take  how to take this  when to take this       Signature:  Coralyn Helling, MD [facility] Pager - [phone] 10/31/2020, 1:08 PM

## 2020-10-31 NOTE — Patient Instructions (Signed)
Will arrange for pulmonary function test and call with results  Follow up in 6 months 

## 2020-11-07 NOTE — Progress Notes (Signed)
Office Visit Note  Patient: Tiffany Glover             Date of Birth: [birth year]           MRN: [id number]             PCP: Mayer MaskerAbonza, Maritza, PA-C Referring: Mayer MaskerAbonza, Maritza, PA-C Visit Date: 11/21/2020 Occupation: @GUAROCC @  Subjective:  Dry mouth and dry eyes.   History of Present Illness: Tiffany Glover is a 60 y.o. female with history of Sjogren's syndrome and interstitial lung disease.  She states she continues to have dry mouth and dry eyes symptoms but improved since she has been using over-the-counter products.  She denies any increased shortness of breath.  She was recently evaluated by Dr. Craige CottaSood and had high-resolution CT scan of the chest which was a stable.  Activities of Daily Living:  Patient reports morning stiffness for 0 minutes.   Patient Denies nocturnal pain.  Difficulty dressing/grooming: Denies Difficulty climbing stairs: Denies Difficulty getting out of chair: Denies Difficulty using hands for taps, buttons, cutlery, and/or writing: Denies  Review of Systems  Constitutional: Negative for fatigue, night sweats, weight gain and weight loss.  HENT: Positive for mouth dryness and nose dryness. Negative for mouth sores, trouble swallowing, trouble swallowing and sore tongue.   Eyes: Positive for itching and dryness. Negative for pain, redness and visual disturbance.  Respiratory: Negative for cough, hemoptysis, shortness of breath and difficulty breathing.   Cardiovascular: Positive for swelling in legs/feet. Negative for chest pain, palpitations, hypertension and irregular heartbeat.  Gastrointestinal: Negative for abdominal pain, blood in stool, constipation and diarrhea.  Endocrine: Negative for increased urination.  Genitourinary: Negative for painful urination and vaginal dryness.  Musculoskeletal: Positive for arthralgias, joint pain, myalgias and myalgias. Negative for joint swelling, muscle weakness, morning stiffness and muscle tenderness.  Skin: Negative  for color change, rash, hair loss, redness, skin tightness, ulcers and sensitivity to sunlight.  Allergic/Immunologic: Negative for susceptible to infections.  Neurological: Negative for dizziness, numbness, headaches, memory loss, night sweats and weakness.  Hematological: Negative for swollen glands.  Psychiatric/Behavioral: Negative for depressed mood, confusion and sleep disturbance. The patient is not nervous/anxious.     PMFS History:  Patient Active Problem List   Diagnosis Date Noted  . Physical deconditioning 09/11/2019  . Abnormal finding on radiology exam- possible emphasema findings on CT abd/pelvis by urology 09/11/2019  . Kidney stone 09/08/2019  . Asymptomatic microscopic hematuria 06/03/2018  . History of nephrolithiasis- not requiring surgery 06/03/2018  . Prediabetes 06/03/2018  . Chronic right-sided thoracic back pain-muscle spasms medial to right scapula and inferior to right scapula 03/18/2018  . Night muscle spasms-bilateral calves and hamstrings. 03/18/2018  . Glucose intolerance (impaired glucose tolerance) 03/18/2018  . Urine discoloration 03/18/2018  . Dehydration, mild 03/18/2018  . Chronic bilateral thoracic back pain 09/26/2017  . Myalgia 09/26/2017  . Plantar fascial fibromatosis of right foot 11/10/2016  . Muscle cramps in legs b/l 11/10/2016  . Salt craving 06/11/2016  . New Onset- Essential hypertension 06/01/2016  . Bilateral impacted cerumen 06/01/2016  . Person with feared health complaint in whom no diagnosis is made 06/01/2016  . Seborrheic dermatitis- ears, scalp 06/01/2016  . Counseling on health promotion and disease prevention 06/01/2016  . Vitamin D insufficiency 03/26/2016  . History of laparoscopic cholecystectomy 02/25/2016  . History of smoking 30 pack years- Quit 9/ 1/ 96 02/25/2016  . Family history of suicide- father age 61 02/25/2016  . Family history of alcoholism in  father; grandfathers etc 02/25/2016  . Dyshidrotic hand  dermatitis 02/25/2016  . Personal history of noncompliance with medical treatment and regimen 02/25/2016  . Elevated blood pressure reading without diagnosis of hypertension 02/25/2016  . Incisional hernia 09/15/2014  . Incisional hernia, without obstruction or gangrene 08/08/2014  . Allergic rhinitis/ seasonal allergies 05/17/2014  . DUB (dysfunctional uterine bleeding) 05/17/2014  . HLD (hyperlipidemia) 05/17/2014  . Elevated fasting blood sugar 05/17/2014  . Class 2 obesity in adult 05/17/2014  . IFG (impaired fasting glucose) 05/17/2014  . Menopausal symptoms 05/17/2014  . Shoulder joint pain 05/17/2014  . Morbid obesity (HCC) 09/14/2013  . h/o Hypothyroidism 09/04/2007  . h/o Hemorrhoids, internal, with bleeding 09/04/2007  . h/o Hemorrhoids, external 09/04/2007  . History of diverticulosis 09/04/2007    Past Medical History:  Diagnosis Date  . Complication of anesthesia    hard to wake up once  . Diverticulosis of colon   . Eczema   . Frequency of urination   . History of hypothyroidism    10-06-2019  per pt no medication needed in several yrs, lab work normal  . History of kidney stones   . Hypertension    10-06-2019 per pt her pcp stopped medication 12/ 2019 (note in epic)  due to pt lost weight and bp improved;  currently per pt she is monitoring bp at home for pcp  . Mixed hyperlipidemia   . Pre-diabetes    followed by pcp, watching diet  . Renal calculus, right   . Urgency of urination   . Varicose vein of leg   . Vitamin D deficiency     Family History  Problem Relation Age of Onset  . Cancer Mother        Lung  . Suicidality Father   . Alcohol abuse Father   . Cancer Brother        mouth  . Cancer Maternal Grandmother   . Breast cancer Maternal Grandmother   . Alcohol abuse Maternal Grandfather   . Urolithiasis Son   . Hyperlipidemia Paternal Grandmother   . Hypertension Paternal Grandmother   . Alcohol abuse Paternal Grandfather   . Colon cancer Neg  Hx   . Colon polyps Neg Hx   . Esophageal cancer Neg Hx   . Rectal cancer Neg Hx   . Stomach cancer Neg Hx    Past Surgical History:  Procedure Laterality Date  . CESAREAN SECTION  x2  last one 34  . CHOLECYSTECTOMY  07/21/2012   Procedure: LAPAROSCOPIC CHOLECYSTECTOMY WITH INTRAOPERATIVE CHOLANGIOGRAM;  Surgeon: Emelia Loron, MD;  Location: [facility];  Service: General;  Laterality: N/A;  attempted  . CHOLECYSTECTOMY  07/21/2012   Procedure: CHOLECYSTECTOMY;  Surgeon: Emelia Loron, MD;  Location: [facility];  Service: General;  Laterality: N/A;  . COLONOSCOPY  last one 03-21-2017  . CYSTOSCOPY/URETEROSCOPY/HOLMIUM LASER/STENT PLACEMENT Right 10/13/2019   Procedure: CYSTOSCOPY RIGHT RETROGRADE PYELOGRAM URETEROSCOPY/HOLMIUM LASER/STENT PLACEMENT;  Surgeon: Crista Elliot, MD;  Location: [facility];  Service: Urology;  Laterality: Right;  . EXTRACORPOREAL SHOCK WAVE LITHOTRIPSY Right 11/02/2018   Procedure: EXTRACORPOREAL SHOCK WAVE LITHOTRIPSY (ESWL);  Surgeon: Crista Elliot, MD;  Location: [facility];  Service: Urology;  Laterality: Right;  . FOOT SURGERY  1990s   ganglion cyst removed from right foot  . INCISIONAL HERNIA REPAIR N/A 09/15/2014   Procedure: LAPAROSCOPIC INCISIONAL HERNIA REPAIR WITH MESH;  Surgeon: Emelia Loron, MD;  Location: [facility];  Service: General;  Laterality: N/A;  . vein  strippiing  1990s   left leg   Social History   Social History Narrative  . Not on file   Immunization History  Administered Date(s) Administered  . PPD Test 12/09/2011     Objective: Vital Signs: BP 139/83 (BP Location: Left Arm, Patient Position: Sitting, Cuff Size: Large)   Pulse 65   Ht 5\' 4"  (1.626 m)   Wt 255 lb 6.4 oz (115.8 kg)   LMP 09/08/2014 (Within Days)   BMI 43.84 kg/m    Physical Exam Vitals and nursing note reviewed.  Constitutional:      Appearance: She is well-developed.  HENT:     Head: Normocephalic and atraumatic.  Eyes:      Conjunctiva/sclera: Conjunctivae normal.  Cardiovascular:     Rate and Rhythm: Normal rate and regular rhythm.     Heart sounds: Normal heart sounds.  Pulmonary:     Effort: Pulmonary effort is normal.     Breath sounds: Normal breath sounds.  Abdominal:     General: Bowel sounds are normal.     Palpations: Abdomen is soft.  Musculoskeletal:     Cervical back: Normal range of motion.  Lymphadenopathy:     Cervical: No cervical adenopathy.  Skin:    General: Skin is warm and dry.     Capillary Refill: Capillary refill takes less than 2 seconds.  Neurological:     Mental Status: She is alert and oriented to person, place, and time.  Psychiatric:        Behavior: Behavior normal.      Musculoskeletal Exam: C-spine thoracic and lumbar spine with good range of motion.  Shoulder joints, elbow joints, wrist joints, MCPs PIPs and DIPs with good range of motion with no synovitis.  Hip joints, knee joints, ankles, MTPs and PIPs with good range of motion with no synovitis.  CDAI Exam: CDAI Score: -- Patient Global: --; Provider Global: -- Swollen: --; Tender: -- Joint Exam 11/21/2020   No joint exam has been documented for this visit   There is currently no information documented on the homunculus. Go to the Rheumatology activity and complete the homunculus joint exam.  Investigation: No additional findings.  Imaging: No results found.  Recent Labs: Lab Results  Component Value Date   WBC 6.6 12/10/2019   HGB 14.5 12/10/2019   PLT 216 12/10/2019   NA 142 12/10/2019   K 4.9 12/10/2019   CL 103 12/10/2019   CO2 23 12/10/2019   GLUCOSE 102 (H) 12/10/2019   BUN 14 12/10/2019   CREATININE 0.60 12/10/2019   BILITOT 0.3 12/10/2019   ALKPHOS 68 12/10/2019   AST 16 12/10/2019   ALT 12 12/10/2019   PROT 6.7 04/18/2020   ALBUMIN 4.5 12/10/2019   CALCIUM 9.5 12/10/2019   GFRAA 115 12/10/2019   QFTBGOLDPLUS NEGATIVE 04/18/2020    Speciality Comments: No specialty comments  available.  Procedures:  No procedures performed Allergies: Patient has no known allergies.   Assessment / Plan:     Visit Diagnoses: Sjogren's syndrome with other organ involvement (HCC) - Positive ANA, positive Ro, dry mouth, dry eyes and dry skin.  Her symptoms are better on pilocarpine.  I detailed discussion regarding use of pilocarpine or Imuran.  Patient is hesitant to go on any other medications at this point.  I gave her a handout on Imuran to review.  Over-the-counter products were discussed.  Idiopathic lymphocytic interstitial pneumonitis (HCC) - Evaluated by Dr. 04/20/2020.  She had high-resolution CT which was stable.  I reviewed the CT scan findings.  Dr. Craige Cotta did not recommend any treatment at this point.  Myalgia - History of muscle pain for many years.  CK is normal.  Chronic right-sided thoracic back pain-muscle spasms medial to right scapula and inferior to right scapula-she has intermittent discomfort.   Essential hypertension-blood pressure is normal today.  Other medical problems listed as follows:  Mixed hyperlipidemia  Dyshidrotic hand dermatitis  Seborrheic dermatitis- ears, scalp  Vitamin D insufficiency  History of hypothyroidism  History of diverticulosis  Prediabetes  History of nephrolithiasis- not requiring surgery  History of laparoscopic cholecystectomy  History of smoking 30 pack years- Quit 9/ 1/ 96  Family history of alcoholism in father; grandfathers etc  Family history of suicide- father age 11  Other sleep apnea - She is on CPAP.  Orders: No orders of the defined types were placed in this encounter.  No orders of the defined types were placed in this encounter.    Follow-Up Instructions: Return in about 6 months (around 05/24/2021) for Sjogren's.   Pollyann Savoy, MD  Note - This record has been created using Animal nutritionist.  Chart creation errors have been sought, but may not always  have been located. Such creation  errors do not reflect on  the standard of medical care.

## 2020-11-21 ENCOUNTER — Ambulatory Visit: Payer: BC Managed Care – PPO | Admitting: Rheumatology

## 2020-11-21 ENCOUNTER — Encounter: Payer: Self-pay | Admitting: Rheumatology

## 2020-11-21 ENCOUNTER — Other Ambulatory Visit: Payer: Self-pay

## 2020-11-21 VITALS — BP 139/83 | HR 65 | Ht 64.0 in | Wt 255.4 lb

## 2020-11-21 DIAGNOSIS — E559 Vitamin D deficiency, unspecified: Secondary | ICD-10-CM

## 2020-11-21 DIAGNOSIS — L219 Seborrheic dermatitis, unspecified: Secondary | ICD-10-CM

## 2020-11-21 DIAGNOSIS — J842 Lymphoid interstitial pneumonia: Secondary | ICD-10-CM | POA: Diagnosis not present

## 2020-11-21 DIAGNOSIS — L301 Dyshidrosis [pompholyx]: Secondary | ICD-10-CM

## 2020-11-21 DIAGNOSIS — M546 Pain in thoracic spine: Secondary | ICD-10-CM

## 2020-11-21 DIAGNOSIS — Z9049 Acquired absence of other specified parts of digestive tract: Secondary | ICD-10-CM

## 2020-11-21 DIAGNOSIS — G8929 Other chronic pain: Secondary | ICD-10-CM

## 2020-11-21 DIAGNOSIS — Z87442 Personal history of urinary calculi: Secondary | ICD-10-CM

## 2020-11-21 DIAGNOSIS — I10 Essential (primary) hypertension: Secondary | ICD-10-CM

## 2020-11-21 DIAGNOSIS — R7303 Prediabetes: Secondary | ICD-10-CM

## 2020-11-21 DIAGNOSIS — G4739 Other sleep apnea: Secondary | ICD-10-CM

## 2020-11-21 DIAGNOSIS — M3509 Sicca syndrome with other organ involvement: Secondary | ICD-10-CM

## 2020-11-21 DIAGNOSIS — M791 Myalgia, unspecified site: Secondary | ICD-10-CM

## 2020-11-21 DIAGNOSIS — E782 Mixed hyperlipidemia: Secondary | ICD-10-CM

## 2020-11-21 DIAGNOSIS — Z8639 Personal history of other endocrine, nutritional and metabolic disease: Secondary | ICD-10-CM

## 2020-11-21 DIAGNOSIS — Z818 Family history of other mental and behavioral disorders: Secondary | ICD-10-CM

## 2020-11-21 DIAGNOSIS — Z79899 Other long term (current) drug therapy: Secondary | ICD-10-CM

## 2020-11-21 DIAGNOSIS — Z87891 Personal history of nicotine dependence: Secondary | ICD-10-CM

## 2020-11-21 DIAGNOSIS — Z811 Family history of alcohol abuse and dependence: Secondary | ICD-10-CM

## 2020-11-21 DIAGNOSIS — Z8719 Personal history of other diseases of the digestive system: Secondary | ICD-10-CM

## 2020-11-21 NOTE — Patient Instructions (Signed)
Azathioprine tablets What is this medicine? AZATHIOPRINE (ay za THYE oh preen) suppresses the immune system. It is used to prevent organ rejection after a transplant. It is also used to treat rheumatoid arthritis. This medicine may be used for other purposes; ask your health care provider or pharmacist if you have questions. COMMON BRAND NAME(S): Azasan, Imuran What should I tell my health care provider before I take this medicine? They need to know if you have any of these conditions:  infection  kidney disease  liver disease  an unusual or allergic reaction to azathioprine, other medicines, lactose, foods, dyes, or preservatives  pregnant or trying to get pregnant  breast feeding How should I use this medicine? Take this medicine by mouth with a full glass of water. Follow the directions on the prescription label. Take your medicine at regular intervals. Do not take your medicine more often than directed. Continue to take your medicine even if you feel better. Do not stop taking except on your doctor's advice. Talk to your pediatrician regarding the use of this medicine in children. Special care may be needed. Overdosage: If you think you have taken too much of this medicine contact a poison control center or emergency room at once. NOTE: This medicine is only for you. Do not share this medicine with others. What if I miss a dose? If you miss a dose, take it as soon as you can. If it is almost time for your next dose, take only that dose. Do not take double or extra doses. What may interact with this medicine? Do not take this medicine with any of the following medications:  febuxostat  mercaptopurine This medicine may also interact with the following medications:  allopurinol  aminosalicylates like sulfasalazine, mesalamine, balsalazide, and olsalazine  leflunomide  medicines called ACE inhibitors like benazepril, captopril, enalapril, fosinopril, quinapril, lisinopril,  ramipril, and trandolapril  mycophenolate  sulfamethoxazole; trimethoprim  vaccines  warfarin This list may not describe all possible interactions. Give your health care provider a list of all the medicines, herbs, non-prescription drugs, or dietary supplements you use. Also tell them if you smoke, drink alcohol, or use illegal drugs. Some items may interact with your medicine. What should I watch for while using this medicine? Visit your doctor or health care professional for regular checks on your progress. You will need frequent blood checks during the first few months you are receiving the medicine. If you get a cold or other infection while receiving this medicine, call your doctor or health care professional. Do not treat yourself. The medicine may increase your risk of getting an infection. Women should inform their doctor if they wish to become pregnant or think they might be pregnant. There is a potential for serious side effects to an unborn child. Talk to your health care professional or pharmacist for more information. Men may have a reduced sperm count while they are taking this medicine. Talk to your health care professional for more information. This medicine may increase your risk of getting certain kinds of cancer. Talk to your doctor about healthy lifestyle choices, important screenings, and your risk. What side effects may I notice from receiving this medicine? Side effects that you should report to your doctor or health care professional as soon as possible:  allergic reactions like skin rash, itching or hives, swelling of the face, lips, or tongue  changes in vision  confusion  fever, chills, or any other sign of infection  loss of balance or coordination    severe stomach pain  unusual bleeding, bruising  unusually weak or tired  vomiting  yellowing of the eyes or skin Side effects that usually do not require medical attention (report to your doctor or health  care professional if they continue or are bothersome):  hair loss  nausea This list may not describe all possible side effects. Call your doctor for medical advice about side effects. You may report side effects to FDA at 1-800-FDA-1088. Where should I keep my medicine? Keep out of the reach of children. Store at room temperature between 15 and 25 degrees C (59 and 77 degrees F). Protect from light. Throw away any unused medicine after the expiration date. NOTE: This sheet is a summary. It may not cover all possible information. If you have questions about this medicine, talk to your doctor, pharmacist, or health care provider.  2021 Elsevier/Gold Standard (2013-10-12 12:00:31)   

## 2020-11-22 ENCOUNTER — Other Ambulatory Visit (HOSPITAL_COMMUNITY): Payer: BC Managed Care – PPO

## 2020-12-05 ENCOUNTER — Other Ambulatory Visit (HOSPITAL_COMMUNITY)
Admission: RE | Admit: 2020-12-05 | Discharge: 2020-12-05 | Disposition: A | Discharge status: Home / Self Care | Payer: BC Managed Care – PPO | Source: Ambulatory Visit | Attending: Pulmonary Disease | Admitting: Pulmonary Disease

## 2020-12-05 DIAGNOSIS — Z20822 Contact with and (suspected) exposure to covid-19: Secondary | ICD-10-CM | POA: Insufficient documentation

## 2020-12-05 DIAGNOSIS — Z01812 Encounter for preprocedural laboratory examination: Secondary | ICD-10-CM | POA: Diagnosis not present

## 2020-12-05 LAB — SARS CORONAVIRUS 2 (TAT 6-24 HRS): SARS Coronavirus 2: NEGATIVE

## 2020-12-08 ENCOUNTER — Other Ambulatory Visit: Payer: Self-pay

## 2020-12-08 ENCOUNTER — Ambulatory Visit: Payer: BC Managed Care – PPO | Admitting: Pulmonary Disease

## 2020-12-08 DIAGNOSIS — J849 Interstitial pulmonary disease, unspecified: Secondary | ICD-10-CM

## 2020-12-08 LAB — PULMONARY FUNCTION TEST
DL/VA % pred: 98 %
DL/VA: 4.16 ml/min/mmHg/L
DLCO cor % pred: 88 %
DLCO cor: 17.81 ml/min/mmHg
DLCO unc % pred: 88 %
DLCO unc: 17.81 ml/min/mmHg
FEF 25-75 Post: 2.24 L/sec
FEF 25-75 Pre: 1.82 L/sec
FEF2575-%Change-Post: 23 %
FEF2575-%Pred-Post: 95 %
FEF2575-%Pred-Pre: 77 %
FEV1-%Change-Post: 4 %
FEV1-%Pred-Post: 81 %
FEV1-%Pred-Pre: 78 %
FEV1-Post: 2.08 L
FEV1-Pre: 2 L
FEV1FVC-%Change-Post: 2 %
FEV1FVC-%Pred-Pre: 102 %
FEV6-%Change-Post: 2 %
FEV6-%Pred-Post: 79 %
FEV6-%Pred-Pre: 77 %
FEV6-Post: 2.54 L
FEV6-Pre: 2.49 L
FEV6FVC-%Pred-Post: 103 %
FEV6FVC-%Pred-Pre: 103 %
FVC-%Change-Post: 2 %
FVC-%Pred-Post: 76 %
FVC-%Pred-Pre: 75 %
FVC-Post: 2.54 L
FVC-Pre: 2.49 L
Post FEV1/FVC ratio: 82 %
Post FEV6/FVC ratio: 100 %
Pre FEV1/FVC ratio: 80 %
Pre FEV6/FVC Ratio: 100 %
RV % pred: 119 %
RV: 2.37 L
TLC % pred: 116 %
TLC: 5.9 L

## 2020-12-08 NOTE — Progress Notes (Signed)
PFT done today. 

## 2020-12-11 ENCOUNTER — Encounter: Payer: Self-pay | Admitting: *Deleted

## 2020-12-12 LAB — HM MAMMOGRAPHY

## 2020-12-28 ENCOUNTER — Encounter: Payer: Self-pay | Admitting: Physician Assistant

## 2021-01-16 ENCOUNTER — Other Ambulatory Visit: Payer: Self-pay

## 2021-01-16 DIAGNOSIS — Z131 Encounter for screening for diabetes mellitus: Secondary | ICD-10-CM

## 2021-01-16 DIAGNOSIS — I10 Essential (primary) hypertension: Secondary | ICD-10-CM

## 2021-01-16 DIAGNOSIS — E782 Mixed hyperlipidemia: Secondary | ICD-10-CM

## 2021-01-16 DIAGNOSIS — E039 Hypothyroidism, unspecified: Secondary | ICD-10-CM

## 2021-01-17 ENCOUNTER — Other Ambulatory Visit: Payer: BC Managed Care – PPO

## 2021-01-17 ENCOUNTER — Other Ambulatory Visit: Payer: Self-pay

## 2021-01-17 DIAGNOSIS — Z131 Encounter for screening for diabetes mellitus: Secondary | ICD-10-CM

## 2021-01-17 DIAGNOSIS — I10 Essential (primary) hypertension: Secondary | ICD-10-CM

## 2021-01-17 DIAGNOSIS — E782 Mixed hyperlipidemia: Secondary | ICD-10-CM

## 2021-01-17 DIAGNOSIS — E039 Hypothyroidism, unspecified: Secondary | ICD-10-CM

## 2021-01-18 LAB — COMPREHENSIVE METABOLIC PANEL
ALT: 18 IU/L (ref 0–32)
AST: 16 IU/L (ref 0–40)
Albumin/Globulin Ratio: 1.6 (ref 1.2–2.2)
Albumin: 4.2 g/dL (ref 3.8–4.9)
Alkaline Phosphatase: 78 IU/L (ref 44–121)
BUN/Creatinine Ratio: 12 (ref 12–28)
BUN: 8 mg/dL (ref 8–27)
Bilirubin Total: 0.4 mg/dL (ref 0.0–1.2)
CO2: 25 mmol/L (ref 20–29)
Calcium: 8.9 mg/dL (ref 8.7–10.3)
Chloride: 102 mmol/L (ref 96–106)
Creatinine, Ser: 0.65 mg/dL (ref 0.57–1.00)
Globulin, Total: 2.7 g/dL (ref 1.5–4.5)
Glucose: 99 mg/dL (ref 65–99)
Potassium: 4.3 mmol/L (ref 3.5–5.2)
Sodium: 140 mmol/L (ref 134–144)
Total Protein: 6.9 g/dL (ref 6.0–8.5)
eGFR: 101 mL/min/{1.73_m2} (ref >59)

## 2021-01-18 LAB — CBC WITH DIFFERENTIAL/PLATELET
Basophils Absolute: 0 10*3/uL (ref 0.0–0.2)
Basos: 1 %
EOS (ABSOLUTE): 0.1 10*3/uL (ref 0.0–0.4)
Eos: 3 %
Hematocrit: 39.2 % (ref 34.0–46.6)
Hemoglobin: 13.1 g/dL (ref 11.1–15.9)
Immature Grans (Abs): 0 10*3/uL (ref 0.0–0.1)
Immature Granulocytes: 0 %
Lymphocytes Absolute: 1.2 10*3/uL (ref 0.7–3.1)
Lymphs: 39 %
MCH: 30 pg (ref 26.6–33.0)
MCHC: 33.4 g/dL (ref 31.5–35.7)
MCV: 90 fL (ref 79–97)
Monocytes Absolute: 0.4 10*3/uL (ref 0.1–0.9)
Monocytes: 13 %
Neutrophils Absolute: 1.4 10*3/uL (ref 1.4–7.0)
Neutrophils: 44 %
Platelets: 193 10*3/uL (ref 150–450)
RBC: 4.37 x10E6/uL (ref 3.77–5.28)
RDW: 12.8 % (ref 11.7–15.4)
WBC: 3.1 10*3/uL — ABNORMAL LOW (ref 3.4–10.8)

## 2021-01-18 LAB — LIPID PANEL
Chol/HDL Ratio: 3.6 ratio (ref 0.0–4.4)
Cholesterol, Total: 185 mg/dL (ref 100–199)
HDL: 51 mg/dL (ref >39)
LDL Chol Calc (NIH): 114 mg/dL — ABNORMAL HIGH (ref 0–99)
Triglycerides: 112 mg/dL (ref 0–149)
VLDL Cholesterol Cal: 20 mg/dL (ref 5–40)

## 2021-01-18 LAB — TSH: TSH: 2.66 u[IU]/mL (ref 0.450–4.500)

## 2021-01-18 LAB — HEMOGLOBIN A1C
Est. average glucose Bld gHb Est-mCnc: 117 mg/dL
Hgb A1c MFr Bld: 5.7 % — ABNORMAL HIGH (ref 4.8–5.6)

## 2021-01-19 ENCOUNTER — Encounter: Payer: Self-pay | Admitting: Physician Assistant

## 2021-01-19 ENCOUNTER — Ambulatory Visit (INDEPENDENT_AMBULATORY_CARE_PROVIDER_SITE_OTHER): Payer: BC Managed Care – PPO | Admitting: Physician Assistant

## 2021-01-19 ENCOUNTER — Other Ambulatory Visit: Payer: Self-pay

## 2021-01-19 VITALS — BP 135/72 | HR 70 | Ht 64.0 in | Wt 251.9 lb

## 2021-01-19 DIAGNOSIS — I10 Essential (primary) hypertension: Secondary | ICD-10-CM | POA: Diagnosis not present

## 2021-01-19 DIAGNOSIS — E782 Mixed hyperlipidemia: Secondary | ICD-10-CM | POA: Diagnosis not present

## 2021-01-19 DIAGNOSIS — E039 Hypothyroidism, unspecified: Secondary | ICD-10-CM

## 2021-01-19 DIAGNOSIS — Z0000 Encounter for general adult medical examination without abnormal findings: Secondary | ICD-10-CM | POA: Diagnosis not present

## 2021-01-19 DIAGNOSIS — J0110 Acute frontal sinusitis, unspecified: Secondary | ICD-10-CM

## 2021-01-19 DIAGNOSIS — E559 Vitamin D deficiency, unspecified: Secondary | ICD-10-CM

## 2021-01-19 MED ORDER — AZITHROMYCIN 250 MG PO TABS: ORAL_TABLET | ORAL | 0 refills | Status: AC

## 2021-01-19 NOTE — Progress Notes (Signed)
Subjective:     Tiffany Glover is a 60 y.o. female and is here for a comprehensive physical exam. The patient reports no problems.  Social History   Socioeconomic History   Marital status: Married    Spouse name: Not on file   Number of children: 2   Years of education: Not on file   Highest education level: Not on file  Occupational History   Occupation: Massage Therapist    Employer: [employer]  Tobacco Use   Smoking status: Former    Packs/day: 1.50    Years: 20.00    Pack years: 30.00    Types: Cigarettes    Quit date: 03/02/1995    Years since quitting: 25.9   Smokeless tobacco: Never  Vaping Use   Vaping Use: Never used  Substance and Sexual Activity   Alcohol use: Yes    Comment: very rare   Drug use: Never   Sexual activity: Not on file  Other Topics Concern   Not on file  Social History Narrative   Not on file   Social Determinants of Health   Financial Resource Strain: Not on file  Food Insecurity: Not on file  Transportation Needs: Not on file  Physical Activity: Not on file  Stress: Not on file  Social Connections: Not on file  Intimate Partner Violence: Not on file   Health Maintenance  Topic Date Due   COVID-19 Vaccine (1) Never done   Pneumococcal Vaccine 31-53 Years old (1 - PCV) Never done   TETANUS/TDAP  Never done   Zoster Vaccines- Shingrix (1 of 2) Never done   INFLUENZA VACCINE  01/29/2021   MAMMOGRAM  12/13/2022   PAP SMEAR-Modifier  04/27/2025   COLONOSCOPY (Pts 45-24yrs Insurance coverage will need to be confirmed)  03/22/2027   Hepatitis C Screening  Completed   HIV Screening  Completed   HPV VACCINES  Aged Out    The following portions of the patient's history were reviewed and updated as appropriate: allergies, current medications, past family history, past medical history, past social history, past surgical history, and problem list.  Review of Systems Pertinent items noted in HPI and remainder of comprehensive ROS otherwise  negative.   Objective:    BP 135/72   Pulse 70   Ht 5\' 4"  (1.626 m)   Wt 251 lb 14.4 oz (114.3 kg)   LMP 09/08/2014 (Within Days)   SpO2 99%   BMI 43.24 kg/m  General appearance: alert, cooperative, and no distress Head: Normocephalic, without obvious abnormality, atraumatic Eyes: conjunctivae/corneas clear. PERRL, EOM's intact. Fundi benign. Ears: normal TM and external ear canal left ear and abnormal TM right ear - serous middle ear fluid Nose: no discharge, turbinates swollen, inflamed, sinus tenderness frontal Throat: lips, mucosa, and tongue normal; teeth and gums normal Neck: mild anterior cervical adenopathy, no JVD, supple, symmetrical, trachea midline, and thyroid: normal to inspection and palpation Back: symmetric, no curvature. ROM normal. No CVA tenderness. Lungs: clear to auscultation bilaterally Heart: regular rate and rhythm and S1, S2 normal Abdomen: soft, non-tender; bowel sounds normal; no masses,  no organomegaly Extremities: no edema, redness or tenderness in the calves or thighs, no ulcers, gangrene or trophic changes, and spider veins noted Pulses: 2+ and symmetric Skin: Skin color, texture, turgor normal. No rashes or lesions Lymph nodes: Cervical adenopathy: mildly enlarged submandibular gland and Supraclavicular adenopathy: normal Neurologic: Grossly normal    Assessment:    Healthy female exam.  Plan:  -Discussed most recent lab results which are essentially within normal limits or stable from prior with the exception of lipid panel (LDL increased from 92-114) and WBC mildly low.  Recommend repeating CBC in 4 weeks.  Patient reports a history of upper respiratory symptoms-nasal congestion, sinus pressure/headache and postnasal drainage for more than 2 weeks with signs on exam suggestive of sinusitis so will start antibiotic therapy. -We will contact [facility] and add vitamin D lab (patient has a history of vitamin D insufficiency, last vitamin D 40.4 on  12/10/19). -UTD on mammogram, colonoscopy and hep C and HIV screenings. -Followed by OB/GYN, last pap normal 04/27/20. -Recommend to follow a heart healthy diet low in fat and monitor carbohydrates. Continue weight loss efforts. -Provided patient medical release form to [facility]. -Follow up in 4 months for reg OV, lab visit in 4 weeks for CBC w/d   See After Visit Summary for Counseling Recommendations

## 2021-01-19 NOTE — Patient Instructions (Signed)
Preventive Care 68-61 Years Old, Female Preventive care refers to lifestyle choices and visits with your health care provider that can promote health and wellness. This includes: A yearly physical exam. This is also called an annual wellness visit. Regular dental and eye exams. Immunizations. Screening for certain conditions. Healthy lifestyle choices, such as: Eating a healthy diet. Getting regular exercise. Not using drugs or products that contain nicotine and tobacco. Limiting alcohol use. What can I expect for my preventive care visit? Physical exam Your health care provider will check your: Height and weight. These may be used to calculate your BMI (body mass index). BMI is a measurement that tells if you are at a healthy weight. Heart rate and blood pressure. Body temperature. Skin for abnormal spots. Counseling Your health care provider may ask you questions about your: Past medical problems. Family's medical history. Alcohol, tobacco, and drug use. Emotional well-being. Home life and relationship well-being. Sexual activity. Diet, exercise, and sleep habits. Work and work Statistician. Access to firearms. Method of birth control. Menstrual cycle. Pregnancy history. What immunizations do I need?  Vaccines are usually given at various ages, according to a schedule. Your health care provider will recommend vaccines for you based on your age, medicalhistory, and lifestyle or other factors, such as travel or where you work. What tests do I need? Blood tests Lipid and cholesterol levels. These may be checked every 5 years, or more often if you are over 37 years old. Hepatitis C test. Hepatitis B test. Screening Lung cancer screening. You may have this screening every year starting at age 30 if you have a 30-pack-year history of smoking and currently smoke or have quit within the past 15 years. Colorectal cancer screening. All adults should have this screening starting at  age 23 and continuing until age 3. Your health care provider may recommend screening at age 88 if you are at increased risk. You will have tests every 1-10 years, depending on your results and the type of screening test. Diabetes screening. This is done by checking your blood sugar (glucose) after you have not eaten for a while (fasting). You may have this done every 1-3 years. Mammogram. This may be done every 1-2 years. Talk with your health care provider about when you should start having regular mammograms. This may depend on whether you have a family history of breast cancer. BRCA-related cancer screening. This may be done if you have a family history of breast, ovarian, tubal, or peritoneal cancers. Pelvic exam and Pap test. This may be done every 3 years starting at age 79. Starting at age 54, this may be done every 5 years if you have a Pap test in combination with an HPV test. Other tests STD (sexually transmitted disease) testing, if you are at risk. Bone density scan. This is done to screen for osteoporosis. You may have this scan if you are at high risk for osteoporosis. Talk with your health care provider about your test results, treatment options,and if necessary, the need for more tests. Follow these instructions at home: Eating and drinking  Eat a diet that includes fresh fruits and vegetables, whole grains, lean protein, and low-fat dairy products. Take vitamin and mineral supplements as recommended by your health care provider. Do not drink alcohol if: Your health care provider tells you not to drink. You are pregnant, may be pregnant, or are planning to become pregnant. If you drink alcohol: Limit how much you have to 0-1 drink a day. Be aware  of how much alcohol is in your drink. In the [place] one drink equals one 12 oz bottle of beer (355 mL), one 5 oz glass of wine (148 mL), or one 1 oz glass of hard liquor (44 mL).  Lifestyle Take daily care of your teeth and  gums. Brush your teeth every morning and night with fluoride toothpaste. Floss one time each day. Stay active. Exercise for at least 30 minutes 5 or more days each week. Do not use any products that contain nicotine or tobacco, such as cigarettes, e-cigarettes, and chewing tobacco. If you need help quitting, ask your health care provider. Do not use drugs. If you are sexually active, practice safe sex. Use a condom or other form of protection to prevent STIs (sexually transmitted infections). If you do not wish to become pregnant, use a form of birth control. If you plan to become pregnant, see your health care provider for a prepregnancy visit. If told by your health care provider, take low-dose aspirin daily starting at age 29. Find healthy ways to cope with stress, such as: Meditation, yoga, or listening to music. Journaling. Talking to a trusted person. Spending time with friends and family. Safety Always wear your seat belt while driving or riding in a vehicle. Do not drive: If you have been drinking alcohol. Do not ride with someone who has been drinking. When you are tired or distracted. While texting. Wear a helmet and other protective equipment during sports activities. If you have firearms in your house, make sure you follow all gun safety procedures. What's next? Visit your health care provider once a year for an annual wellness visit. Ask your health care provider how often you should have your eyes and teeth checked. Stay up to date on all vaccines. This information is not intended to replace advice given to you by your health care provider. Make sure you discuss any questions you have with your healthcare provider. Document Revised: 03/21/2020 Document Reviewed: 02/26/2018 Elsevier Patient Education  2022 [employer].

## 2021-01-22 LAB — VITAMIN D 25 HYDROXY (VIT D DEFICIENCY, FRACTURES): Vit D, 25-Hydroxy: 39.2 ng/mL (ref 30.0–100.0)

## 2021-01-22 LAB — SPECIMEN STATUS REPORT

## 2021-05-08 NOTE — Progress Notes (Signed)
Office Visit Note  Patient: Tiffany Glover             Date of Birth: [birth year]           MRN: [id number]             PCP: Lorrene Reid, PA-C Referring: Lorrene Reid, PA-C Visit Date: 05/22/2021 Occupation: @GUAROCC @  Subjective:  Sicca symptoms   History of Present Illness: Tiffany Glover is a 60 y.o. female with history of Sjogren's syndrome and ILD.  She is not currently taking any immunosuppressive agents or antifibrotic agents.  She is followed closely by Dr. Halford Chessman.  She denies any increased shortness of breath or new pulmonary symptoms.  She had PFTs performed on 12/08/2020, which were stable. She has been using his CPAP on a nightly basis and has noticed significant improvement in her energy level.  She is also noticed less mouth dryness.  She states that she ran out of prescription for pilocarpine so has not been taking it recently but would like a refill.  She denies any recent dental caries or salivary stones.  She experiences intermittent eye dryness but has not been using any eyedrops. She denies any increased joint pain or joint swelling.  She has started to follow a vegetarian diet and has been eliminating dairy from her diet.     Activities of Daily Living:  Patient reports morning stiffness for 0  none .   Patient Denies nocturnal pain.  Difficulty dressing/grooming: Denies Difficulty climbing stairs: Denies Difficulty getting out of chair: Denies Difficulty using hands for taps, buttons, cutlery, and/or writing: Denies  Review of Systems  Constitutional:  Negative for fatigue.  HENT:  Negative for mouth sores, mouth dryness and nose dryness.   Eyes:  Positive for dryness. Negative for pain and visual disturbance.  Respiratory:  Negative for cough, hemoptysis, shortness of breath and difficulty breathing.   Cardiovascular:  Positive for swelling in legs/feet. Negative for chest pain, palpitations and hypertension.  Gastrointestinal:  Negative for blood in  stool, constipation and diarrhea.  Endocrine: Negative for increased urination.  Genitourinary:  Negative for difficulty urinating and painful urination.  Musculoskeletal:  Negative for joint pain, joint pain, joint swelling, myalgias, muscle weakness, morning stiffness, muscle tenderness and myalgias.  Skin:  Negative for color change, pallor, rash, hair loss, nodules/bumps, skin tightness, ulcers and sensitivity to sunlight.  Allergic/Immunologic: Negative for susceptible to infections.  Neurological:  Negative for dizziness, numbness, headaches and weakness.  Hematological:  Negative for bruising/bleeding tendency and swollen glands.  Psychiatric/Behavioral:  Negative for depressed mood and sleep disturbance. The patient is not nervous/anxious.    PMFS History:  Patient Active Problem List   Diagnosis Date Noted   Physical deconditioning 09/11/2019   Abnormal finding on radiology exam- possible emphasema findings on CT abd/pelvis by urology 09/11/2019   Kidney stone 09/08/2019   Asymptomatic microscopic hematuria 06/03/2018   History of nephrolithiasis- not requiring surgery 06/03/2018   Prediabetes 06/03/2018   Chronic right-sided thoracic back pain-muscle spasms medial to right scapula and inferior to right scapula 03/18/2018   Night muscle spasms-bilateral calves and hamstrings. 03/18/2018   Glucose intolerance (impaired glucose tolerance) 03/18/2018   Urine discoloration 03/18/2018   Dehydration, mild 03/18/2018   Chronic bilateral thoracic back pain 09/26/2017   Myalgia 09/26/2017   Plantar fascial fibromatosis of right foot 11/10/2016   Muscle cramps in legs b/l 11/10/2016   Salt craving 06/11/2016   New Onset- Essential hypertension 06/01/2016   Bilateral impacted  cerumen 06/01/2016   Person with feared health complaint in whom no diagnosis is made 06/01/2016   Seborrheic dermatitis- ears, scalp 06/01/2016   Counseling on health promotion and disease prevention 06/01/2016    Vitamin D insufficiency 03/26/2016   History of laparoscopic cholecystectomy 02/25/2016   History of smoking 30 pack years- Quit 9/ 1/ 96 02/25/2016   Family history of suicide- father age 58 02/25/2016   Family history of alcoholism in father; grandfathers etc 02/25/2016   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   Personal history of noncompliance with medical treatment and regimen 02/25/2016   Elevated blood pressure reading without diagnosis of hypertension 02/25/2016   Incisional hernia 09/15/2014   Incisional hernia, without obstruction or gangrene 08/08/2014   Allergic rhinitis/ seasonal allergies 05/17/2014   DUB (dysfunctional uterine bleeding) 05/17/2014   HLD (hyperlipidemia) 05/17/2014   Elevated fasting blood sugar 05/17/2014   Class 2 obesity in adult 05/17/2014   IFG (impaired fasting glucose) 05/17/2014   Menopausal symptoms 05/17/2014   Shoulder joint pain 05/17/2014   Morbid obesity ([place]) 09/14/2013   h/o Hypothyroidism 09/04/2007   h/o Hemorrhoids, internal, with bleeding 09/04/2007   h/o Hemorrhoids, external 09/04/2007   History of diverticulosis 09/04/2007    Past Medical History:  Diagnosis Date   Complication of anesthesia    hard to wake up once   Diverticulosis of colon    Eczema    Frequency of urination    History of hypothyroidism    10-06-2019  per pt no medication needed in several yrs, lab work normal   History of kidney stones    Hypertension    10-06-2019 per pt her pcp stopped medication 12/ 2019 (note in epic)  due to pt lost weight and bp improved;  currently per pt she is monitoring bp at home for pcp   Mixed hyperlipidemia    Pre-diabetes    followed by pcp, watching diet   Renal calculus, right    Sleep apnea    Urgency of urination    Varicose vein of leg    Vitamin D deficiency     Family History  Problem Relation Age of Onset   Cancer Mother        Lung   Suicidality Father    Alcohol abuse Father    Cancer Brother         mouth   Cancer Maternal Grandmother    Breast cancer Maternal Grandmother    Alcohol abuse Maternal Grandfather    Urolithiasis Son    Hyperlipidemia Paternal Grandmother    Hypertension Paternal Grandmother    Alcohol abuse Paternal Grandfather    Colon cancer Neg Hx    Colon polyps Neg Hx    Esophageal cancer Neg Hx    Rectal cancer Neg Hx    Stomach cancer Neg Hx    Past Surgical History:  Procedure Laterality Date   CESAREAN SECTION  x2  last one 1994   CHOLECYSTECTOMY  07/21/2012   Procedure: LAPAROSCOPIC CHOLECYSTECTOMY WITH INTRAOPERATIVE CHOLANGIOGRAM;  Surgeon: Rolm Bookbinder, MD;  Location: [facility];  Service: General;  Laterality: N/A;  attempted   CHOLECYSTECTOMY  07/21/2012   Procedure: CHOLECYSTECTOMY;  Surgeon: Rolm Bookbinder, MD;  Location: [facility];  Service: General;  Laterality: N/A;   COLONOSCOPY  last one 03-21-2017   CYSTOSCOPY/URETEROSCOPY/HOLMIUM LASER/STENT PLACEMENT Right 10/13/2019   Procedure: CYSTOSCOPY RIGHT RETROGRADE PYELOGRAM URETEROSCOPY/HOLMIUM LASER/STENT PLACEMENT;  Surgeon: Lucas Mallow, MD;  Location: [place];  Service: Urology;  Laterality: Right;  EXTRACORPOREAL SHOCK WAVE LITHOTRIPSY Right 11/02/2018   Procedure: EXTRACORPOREAL SHOCK WAVE LITHOTRIPSY (ESWL);  Surgeon: Lucas Mallow, MD;  Location: [facility];  Service: Urology;  Laterality: Right;   FOOT SURGERY  1990s   ganglion cyst removed from right foot   INCISIONAL HERNIA REPAIR N/A 09/15/2014   Procedure: LAPAROSCOPIC INCISIONAL HERNIA REPAIR WITH MESH;  Surgeon: Rolm Bookbinder, MD;  Location: [facility];  Service: General;  Laterality: N/A;   vein strippiing  1990s   left leg   Social History   Social History Narrative   Not on file   Immunization History  Administered Date(s) Administered   PPD Test 12/09/2011     Objective: Vital Signs: BP (!) 146/73 (BP Location: Left Arm, Patient Position: Sitting, Cuff Size: Normal)   Pulse 71   Resp 16   Ht 5'  4" (1.626 m)   Wt 260 lb (117.9 kg)   LMP 09/08/2014 (Within Days)   BMI 44.63 kg/m    Physical Exam Vitals and nursing note reviewed.  Constitutional:      Appearance: She is well-developed.  HENT:     Head: Normocephalic and atraumatic.  Eyes:     Conjunctiva/sclera: Conjunctivae normal.  Cardiovascular:     Rate and Rhythm: Normal rate and regular rhythm.     Heart sounds: Normal heart sounds.  Pulmonary:     Effort: Pulmonary effort is normal.     Breath sounds: Normal breath sounds.  Abdominal:     General: Bowel sounds are normal.     Palpations: Abdomen is soft.  Musculoskeletal:     Cervical back: Normal range of motion.  Lymphadenopathy:     Cervical: No cervical adenopathy.  Skin:    General: Skin is warm and dry.     Capillary Refill: Capillary refill takes less than 2 seconds.  Neurological:     Mental Status: She is alert and oriented to person, place, and time.  Psychiatric:        Behavior: Behavior normal.     Musculoskeletal Exam: C-spine has good range of motion with no discomfort.  Shoulder joints, elbow joints, wrist joints, MCPs, PIPs, DIPs have good range of motion with no synovitis.  Complete fist formation bilaterally.  Hip joints have good range of motion with no discomfort.  No tenderness over trochanteric bursa bilaterally.  Knee joints have good range of motion with no warmth or effusion.  Ankle joints have good range of motion with no tenderness or joint swelling. Pedal edema noted bilaterally.   CDAI Exam: CDAI Score: -- Patient Global: --; Provider Global: -- Swollen: --; Tender: -- Joint Exam 05/22/2021   No joint exam has been documented for this visit   There is currently no information documented on the homunculus. Go to the Rheumatology activity and complete the homunculus joint exam.  Investigation: No additional findings.  Imaging: No results found.  Recent Labs: Lab Results  Component Value Date   WBC 3.1 (L) 01/17/2021    HGB 13.1 01/17/2021   PLT 193 01/17/2021   NA 140 01/17/2021   K 4.3 01/17/2021   CL 102 01/17/2021   CO2 25 01/17/2021   GLUCOSE 99 01/17/2021   BUN 8 01/17/2021   CREATININE 0.65 01/17/2021   BILITOT 0.4 01/17/2021   ALKPHOS 78 01/17/2021   AST 16 01/17/2021   ALT 18 01/17/2021   PROT 6.9 01/17/2021   ALBUMIN 4.2 01/17/2021   CALCIUM 8.9 01/17/2021   GFRAA 115 12/10/2019   QFTBGOLDPLUS NEGATIVE  04/18/2020    Speciality Comments: No specialty comments available.  Procedures:  No procedures performed Allergies: Patient has no known allergies.   Assessment / Plan:     Visit Diagnoses: Sjogren's syndrome with other organ involvement (HCC) - Positive ANA, positive Ro, dry mouth, dry eyes and dry skin:  She has not developed any new or worsening symptoms since her last office visit.  She has not had any signs or symptoms of a flare.  Her symptoms of dry mouth have improved since using a CPAP on a nightly basis.  Her energy level has also improved significantly.  Discussed the use of over-the-counter product such as Biotene or XyliMelts for symptomatic relief.  She was previously taking pilocarpine 5 mg 1 tablet 3 times daily as needed for symptomatic relief but ran out of the prescription.  A refill was sent to the pharmacy today.  Discussed the importance of proper oral hygiene.  Strongly encouraged to see the dentist every 6 months due to the increased risk for dental caries.  Also discussed the association sjogren's syndrome with lymphoma.  No cervical lymphadenopathy was noted.  SPEP, RF, and CBC with differential updated today. She was also strongly encouraged to see her ophthalmologist on a regular basis.  Discussed the use of Systane or refresh for dry eyes. No signs of inflammatory arthritis were noted on examination today.  She had no joint tenderness or synovitis. She continues to follow-up closely with Dr. Craige CottaSood.  She has not had any new or worsening pulmonary symptoms.   High-resolution chest CT updated on 09/18/2020.  PFTs updated on 12/08/2020.  She does not require antifibrotic or immunosuppressive therapy at this time.  She was advised to notify us if she develops any new or worsening pulmonary symptoms.  She will following up with Dr. Craige CottaSood in early 2023.   The following lab work will be updated today for monitoring purposes. - Plan: CBC with Differential/Platelet, COMPLETE METABOLIC PANEL WITH GFR, Urinalysis, Routine w reflex microscopic, ANA, Rheumatoid factor, Serum protein electrophoresis with reflex, Sjogrens syndrome-A extractable nuclear antibody, pilocarpine (SALAGEN) 5 MG tablet  Idiopathic lymphocytic interstitial pneumonitis (HCC) - Evaluated by Dr. Craige CottaSood.  She had high-resolution CT which was stable.High-resolution chest CT results from 09/18/2020 were reviewed today in the office: findings were stable in comparison to both prior CT of the chest dated 09/07/2019 and in comparison to the included lung bases on examination dated 08/11/2014.  She does not require antibiotic or immunosuppressive therapy at this time.  She has not noticed any new or worsening pulmonary symptoms.  She will be following up with Dr. Craige CottaSood in early 2023.  Myalgia - History of muscle pain for many years. She is not experiencing any increased myalgias or muscle tenderness at this time.    Chronic right-sided thoracic back pain-Improved.  Other medical conditions are listed as follows:  Dyshidrotic hand dermatitis  Vitamin D insufficiency: She is taking vitamin D 5000 units daily.  Mixed hyperlipidemia  Seborrheic dermatitis- ears, scalp  History of diverticulosis  History of hypothyroidism  Prediabetes  Other sleep apnea - She is on CPAP.  Her energy level has improved significantly since starting to use a CPAP.  History of laparoscopic cholecystectomy  History of nephrolithiasis- not requiring surgery  History of smoking 30 pack years- Quit 9/ 1/ 96  Family  history of suicide- father age 61  Family history of alcoholism in father; grandfathers etc  Essential hypertension  Orders: Orders Placed This Encounter  Procedures  CBC with Differential/Platelet   COMPLETE METABOLIC PANEL WITH GFR   Urinalysis, Routine w reflex microscopic   ANA   Rheumatoid factor   Serum protein electrophoresis with reflex   Sjogrens syndrome-A extractable nuclear antibody   Meds ordered this encounter  Medications   pilocarpine (SALAGEN) 5 MG tablet    Sig: Take 1 tablet (5 mg total) by mouth 3 (three) times daily as needed.    Dispense:  180 tablet    Refill:  1     Follow-Up Instructions: Return in about 6 months (around 11/19/2021) for Sjogren's syndrome.   Gearldine Bienenstock, PA-C  Note - This record has been created using Dragon software.  Chart creation errors have been sought, but may not always  have been located. Such creation errors do not reflect on  the standard of medical care.

## 2021-05-22 ENCOUNTER — Other Ambulatory Visit: Payer: Self-pay

## 2021-05-22 ENCOUNTER — Ambulatory Visit: Payer: BC Managed Care – PPO | Admitting: Physician Assistant

## 2021-05-22 ENCOUNTER — Encounter: Payer: Self-pay | Admitting: Physician Assistant

## 2021-05-22 VITALS — BP 146/73 | HR 71 | Resp 16 | Ht 64.0 in | Wt 260.0 lb

## 2021-05-22 DIAGNOSIS — I10 Essential (primary) hypertension: Secondary | ICD-10-CM

## 2021-05-22 DIAGNOSIS — M3509 Sicca syndrome with other organ involvement: Secondary | ICD-10-CM | POA: Diagnosis not present

## 2021-05-22 DIAGNOSIS — M791 Myalgia, unspecified site: Secondary | ICD-10-CM | POA: Diagnosis not present

## 2021-05-22 DIAGNOSIS — Z87891 Personal history of nicotine dependence: Secondary | ICD-10-CM

## 2021-05-22 DIAGNOSIS — Z8639 Personal history of other endocrine, nutritional and metabolic disease: Secondary | ICD-10-CM

## 2021-05-22 DIAGNOSIS — E782 Mixed hyperlipidemia: Secondary | ICD-10-CM

## 2021-05-22 DIAGNOSIS — L219 Seborrheic dermatitis, unspecified: Secondary | ICD-10-CM

## 2021-05-22 DIAGNOSIS — M546 Pain in thoracic spine: Secondary | ICD-10-CM

## 2021-05-22 DIAGNOSIS — Z9049 Acquired absence of other specified parts of digestive tract: Secondary | ICD-10-CM

## 2021-05-22 DIAGNOSIS — G8929 Other chronic pain: Secondary | ICD-10-CM

## 2021-05-22 DIAGNOSIS — J842 Lymphoid interstitial pneumonia: Secondary | ICD-10-CM | POA: Diagnosis not present

## 2021-05-22 DIAGNOSIS — Z811 Family history of alcohol abuse and dependence: Secondary | ICD-10-CM

## 2021-05-22 DIAGNOSIS — R7303 Prediabetes: Secondary | ICD-10-CM

## 2021-05-22 DIAGNOSIS — Z8719 Personal history of other diseases of the digestive system: Secondary | ICD-10-CM

## 2021-05-22 DIAGNOSIS — E559 Vitamin D deficiency, unspecified: Secondary | ICD-10-CM

## 2021-05-22 DIAGNOSIS — G4739 Other sleep apnea: Secondary | ICD-10-CM

## 2021-05-22 DIAGNOSIS — L301 Dyshidrosis [pompholyx]: Secondary | ICD-10-CM

## 2021-05-22 DIAGNOSIS — Z818 Family history of other mental and behavioral disorders: Secondary | ICD-10-CM

## 2021-05-22 DIAGNOSIS — Z87442 Personal history of urinary calculi: Secondary | ICD-10-CM

## 2021-05-22 MED ORDER — PILOCARPINE HCL 5 MG PO TABS
5.0000 mg | ORAL_TABLET | Freq: Three times a day (TID) | ORAL | 1 refills | Status: DC | PRN
Start: 2021-05-22 — End: 2023-04-28

## 2021-05-22 NOTE — Patient Instructions (Addendum)
Refresh and Systane eye drops are available over the counter

## 2021-05-23 ENCOUNTER — Ambulatory Visit: Payer: BC Managed Care – PPO | Admitting: Physician Assistant

## 2021-05-23 NOTE — Progress Notes (Signed)
CBC and CMP WNL.  UA normal.  RF negative.

## 2021-05-25 LAB — COMPLETE METABOLIC PANEL WITH GFR
AG Ratio: 1.6 (calc) (ref 1.0–2.5)
ALT: 15 U/L (ref 6–29)
AST: 15 U/L (ref 10–35)
Albumin: 4.4 g/dL (ref 3.6–5.1)
Alkaline phosphatase (APISO): 80 U/L (ref 37–153)
BUN: 10 mg/dL (ref 7–25)
CO2: 29 mmol/L (ref 20–32)
Calcium: 9.7 mg/dL (ref 8.6–10.4)
Chloride: 105 mmol/L (ref 98–110)
Creat: 0.55 mg/dL (ref 0.50–1.05)
Globulin: 2.8 g/dL (calc) (ref 1.9–3.7)
Glucose, Bld: 92 mg/dL (ref 65–99)
Potassium: 4.2 mmol/L (ref 3.5–5.3)
Sodium: 142 mmol/L (ref 135–146)
Total Bilirubin: 0.5 mg/dL (ref 0.2–1.2)
Total Protein: 7.2 g/dL (ref 6.1–8.1)
eGFR: 105 mL/min/{1.73_m2} (ref >=60)

## 2021-05-25 LAB — PROTEIN ELECTROPHORESIS, SERUM, WITH REFLEX
Albumin ELP: 4 g/dL (ref 3.8–4.8)
Alpha 1: 0.3 g/dL (ref 0.2–0.3)
Alpha 2: 0.7 g/dL (ref 0.5–0.9)
Beta 2: 0.4 g/dL (ref 0.2–0.5)
Beta Globulin: 0.4 g/dL (ref 0.4–0.6)
Gamma Globulin: 1.2 g/dL (ref 0.8–1.7)
Total Protein: 7 g/dL (ref 6.1–8.1)

## 2021-05-25 LAB — CBC WITH DIFFERENTIAL/PLATELET
Absolute Monocytes: 414 cells/uL (ref 200–950)
Basophils Absolute: 29 cells/uL (ref 0–200)
Basophils Relative: 0.7 %
Eosinophils Absolute: 123 cells/uL (ref 15–500)
Eosinophils Relative: 3 %
HCT: 39.8 % (ref 35.0–45.0)
Hemoglobin: 13.2 g/dL (ref 11.7–15.5)
Lymphs Abs: 1214 cells/uL (ref 850–3900)
MCH: 30.4 pg (ref 27.0–33.0)
MCHC: 33.2 g/dL (ref 32.0–36.0)
MCV: 91.7 fL (ref 80.0–100.0)
MPV: 12.9 fL — ABNORMAL HIGH (ref 7.5–12.5)
Monocytes Relative: 10.1 %
Neutro Abs: 2321 cells/uL (ref 1500–7800)
Neutrophils Relative %: 56.6 %
Platelets: 170 10*3/uL (ref 140–400)
RBC: 4.34 10*6/uL (ref 3.80–5.10)
RDW: 12.8 % (ref 11.0–15.0)
Total Lymphocyte: 29.6 %
WBC: 4.1 10*3/uL (ref 3.8–10.8)

## 2021-05-25 LAB — URINALYSIS, ROUTINE W REFLEX MICROSCOPIC
Bilirubin Urine: NEGATIVE
Glucose, UA: NEGATIVE
Hgb urine dipstick: NEGATIVE
Ketones, ur: NEGATIVE
Leukocytes,Ua: NEGATIVE
Nitrite: NEGATIVE
Protein, ur: NEGATIVE
Specific Gravity, Urine: 1.017 (ref 1.001–1.035)
pH: 5.5 (ref 5.0–8.0)

## 2021-05-25 LAB — ANA: Anti Nuclear Antibody (ANA): NEGATIVE

## 2021-05-25 LAB — SJOGRENS SYNDROME-A EXTRACTABLE NUCLEAR ANTIBODY: SSA (Ro) (ENA) Antibody, IgG: >8 AI — AB

## 2021-05-25 LAB — RHEUMATOID FACTOR: Rheumatoid fact SerPl-aCnc: <14 IU/mL (ref <14)

## 2021-05-25 NOTE — Progress Notes (Signed)
Ro antibody remains positive. SPEP did not reveal any abnormal proteins.

## 2021-05-27 NOTE — Progress Notes (Signed)
ANA negative.  No further recommendations at this time.

## 2021-11-21 ENCOUNTER — Ambulatory Visit: Payer: BC Managed Care – PPO | Admitting: Rheumatology

## 2021-11-22 NOTE — Progress Notes (Deleted)
Office Visit Note  Patient: Tiffany GhaziRhonda C Landsberg             Date of Birth: Mar 29, 1961           MRN: [id number]             PCP: Mayer MaskerAbonza, Maritza, PA-C Referring: Mayer MaskerAbonza, Maritza, PA-C Visit Date: 12/05/2021 Occupation: @GUAROCC @  Subjective:  No chief complaint on file.   History of Present Illness: Tiffany Glover is a 61 y.o. female ***   Activities of Daily Living:  Patient reports morning stiffness for *** {minute/hour:19697}.   Patient {ACTIONS;DENIES/REPORTS:21021675::"Denies"} nocturnal pain.  Difficulty dressing/grooming: {ACTIONS;DENIES/REPORTS:21021675::"Denies"} Difficulty climbing stairs: {ACTIONS;DENIES/REPORTS:21021675::"Denies"} Difficulty getting out of chair: {ACTIONS;DENIES/REPORTS:21021675::"Denies"} Difficulty using hands for taps, buttons, cutlery, and/or writing: {ACTIONS;DENIES/REPORTS:21021675::"Denies"}  No Rheumatology ROS completed.   PMFS History:  Patient Active Problem List   Diagnosis Date Noted   Physical deconditioning 09/11/2019   Abnormal finding on radiology exam- possible emphasema findings on CT abd/pelvis by urology 09/11/2019   Kidney stone 09/08/2019   Asymptomatic microscopic hematuria 06/03/2018   History of nephrolithiasis- not requiring surgery 06/03/2018   Prediabetes 06/03/2018   Chronic right-sided thoracic back pain-muscle spasms medial to right scapula and inferior to right scapula 03/18/2018   Night muscle spasms-bilateral calves and hamstrings. 03/18/2018   Glucose intolerance (impaired glucose tolerance) 03/18/2018   Urine discoloration 03/18/2018   Dehydration, mild 03/18/2018   Chronic bilateral thoracic back pain 09/26/2017   Myalgia 09/26/2017   Plantar fascial fibromatosis of right foot 11/10/2016   Muscle cramps in legs b/l 11/10/2016   Salt craving 06/11/2016   New Onset- Essential hypertension 06/01/2016   Bilateral impacted cerumen 06/01/2016   Person with feared health complaint in whom no diagnosis is made  06/01/2016   Seborrheic dermatitis- ears, scalp 06/01/2016   Counseling on health promotion and disease prevention 06/01/2016   Vitamin D insufficiency 03/26/2016   History of laparoscopic cholecystectomy 02/25/2016   History of smoking 30 pack years- Quit 9/ 1/ 96 02/25/2016   Family history of suicide- father age 62 02/25/2016   Family history of alcoholism in father; grandfathers etc 02/25/2016   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   Personal history of noncompliance with medical treatment and regimen 02/25/2016   Elevated blood pressure reading without diagnosis of hypertension 02/25/2016   Incisional hernia 09/15/2014   Incisional hernia, without obstruction or gangrene 08/08/2014   Allergic rhinitis/ seasonal allergies 05/17/2014   DUB (dysfunctional uterine bleeding) 05/17/2014   HLD (hyperlipidemia) 05/17/2014   Elevated fasting blood sugar 05/17/2014   Class 2 obesity in adult 05/17/2014   IFG (impaired fasting glucose) 05/17/2014   Menopausal symptoms 05/17/2014   Shoulder joint pain 05/17/2014   Morbid obesity (HCC) 09/14/2013   h/o Hypothyroidism 09/04/2007   h/o Hemorrhoids, internal, with bleeding 09/04/2007   h/o Hemorrhoids, external 09/04/2007   History of diverticulosis 09/04/2007    Past Medical History:  Diagnosis Date   Complication of anesthesia    hard to wake up once   Diverticulosis of colon    Eczema    Frequency of urination    History of hypothyroidism    10-06-2019  per pt no medication needed in several yrs, lab work normal   History of kidney stones    Hypertension    10-06-2019 per pt her pcp stopped medication 12/ 2019 (note in epic)  due to pt lost weight and bp improved;  currently per pt she is monitoring bp at home for pcp   Mixed hyperlipidemia  Pre-diabetes    followed by pcp, watching diet   Renal calculus, right    Sleep apnea    Urgency of urination    Varicose vein of leg    Vitamin D deficiency     Family History   Problem Relation Age of Onset   Cancer Mother        Lung   Suicidality Father    Alcohol abuse Father    Cancer Brother        mouth   Cancer Maternal Grandmother    Breast cancer Maternal Grandmother    Alcohol abuse Maternal Grandfather    Urolithiasis Son    Hyperlipidemia Paternal Grandmother    Hypertension Paternal Grandmother    Alcohol abuse Paternal Grandfather    Colon cancer Neg Hx    Colon polyps Neg Hx    Esophageal cancer Neg Hx    Rectal cancer Neg Hx    Stomach cancer Neg Hx    Past Surgical History:  Procedure Laterality Date   CESAREAN SECTION  x2  last one 1994   CHOLECYSTECTOMY  07/21/2012   Procedure: LAPAROSCOPIC CHOLECYSTECTOMY WITH INTRAOPERATIVE CHOLANGIOGRAM;  Surgeon: Emelia Loron, MD;  Location: [facility];  Service: General;  Laterality: N/A;  attempted   CHOLECYSTECTOMY  07/21/2012   Procedure: CHOLECYSTECTOMY;  Surgeon: Emelia Loron, MD;  Location: [facility];  Service: General;  Laterality: N/A;   COLONOSCOPY  last one 03-21-2017   CYSTOSCOPY/URETEROSCOPY/HOLMIUM LASER/STENT PLACEMENT Right 10/13/2019   Procedure: CYSTOSCOPY RIGHT RETROGRADE PYELOGRAM URETEROSCOPY/HOLMIUM LASER/STENT PLACEMENT;  Surgeon: Crista Elliot, MD;  Location: [facility];  Service: Urology;  Laterality: Right;   EXTRACORPOREAL SHOCK WAVE LITHOTRIPSY Right 11/02/2018   Procedure: EXTRACORPOREAL SHOCK WAVE LITHOTRIPSY (ESWL);  Surgeon: Crista Elliot, MD;  Location: [facility];  Service: Urology;  Laterality: Right;   FOOT SURGERY  1990s   ganglion cyst removed from right foot   INCISIONAL HERNIA REPAIR N/A 09/15/2014   Procedure: LAPAROSCOPIC INCISIONAL HERNIA REPAIR WITH MESH;  Surgeon: Emelia Loron, MD;  Location: [facility];  Service: General;  Laterality: N/A;   vein strippiing  1990s   left leg   Social History   Social History Narrative   Not on file   Immunization History  Administered Date(s) Administered   PPD Test 12/09/2011      Objective: Vital Signs: LMP 09/08/2014 (Within Days)    Physical Exam   Musculoskeletal Exam: ***  CDAI Exam: CDAI Score: -- Patient Global: --; Provider Global: -- Swollen: --; Tender: -- Joint Exam 12/05/2021   No joint exam has been documented for this visit   There is currently no information documented on the homunculus. Go to the Rheumatology activity and complete the homunculus joint exam.  Investigation: No additional findings.  Imaging: No results found.  Recent Labs: Lab Results  Component Value Date   WBC 4.1 05/22/2021   HGB 13.2 05/22/2021   PLT 170 05/22/2021   NA 142 05/22/2021   K 4.2 05/22/2021   CL 105 05/22/2021   CO2 29 05/22/2021   GLUCOSE 92 05/22/2021   BUN 10 05/22/2021   CREATININE 0.55 05/22/2021   BILITOT 0.5 05/22/2021   ALKPHOS 78 01/17/2021   AST 15 05/22/2021   ALT 15 05/22/2021   PROT 7.2 05/22/2021   PROT 7.0 05/22/2021   ALBUMIN 4.2 01/17/2021   CALCIUM 9.7 05/22/2021   GFRAA 115 12/10/2019   QFTBGOLDPLUS NEGATIVE 04/18/2020    Speciality Comments: No specialty comments available.  Procedures:  No procedures performed Allergies: Patient has no known allergies.   Assessment / Plan:     Visit Diagnoses: Sjogren's syndrome with other organ involvement (HCC)  Idiopathic lymphocytic interstitial pneumonitis (HCC)  Myalgia  Chronic right-sided thoracic back pain-muscle spasms medial to right scapula and inferior to right scapula  Dyshidrotic hand dermatitis  Vitamin D insufficiency  Mixed hyperlipidemia  Seborrheic dermatitis- ears, scalp  Essential hypertension  History of diverticulosis  History of hypothyroidism  Prediabetes  Other sleep apnea  History of laparoscopic cholecystectomy  History of nephrolithiasis- not requiring surgery  History of smoking 30 pack years- Quit 9/ 1/ 96  Family history of suicide- father age 76  Orders: No orders of the defined types were placed in this  encounter.  No orders of the defined types were placed in this encounter.   Face-to-face time spent with patient was *** minutes. Greater than 50% of time was spent in counseling and coordination of care.  Follow-Up Instructions: No follow-ups on file.   Gearldine Bienenstock, PA-C  Note - This record has been created using Dragon software.  Chart creation errors have been sought, but may not always  have been located. Such creation errors do not reflect on  the standard of medical care.

## 2021-12-05 ENCOUNTER — Ambulatory Visit: Payer: BC Managed Care – PPO | Admitting: Rheumatology

## 2021-12-05 DIAGNOSIS — J842 Lymphoid interstitial pneumonia: Secondary | ICD-10-CM

## 2021-12-05 DIAGNOSIS — L219 Seborrheic dermatitis, unspecified: Secondary | ICD-10-CM

## 2021-12-05 DIAGNOSIS — G4739 Other sleep apnea: Secondary | ICD-10-CM

## 2021-12-05 DIAGNOSIS — R7303 Prediabetes: Secondary | ICD-10-CM

## 2021-12-05 DIAGNOSIS — Z87891 Personal history of nicotine dependence: Secondary | ICD-10-CM

## 2021-12-05 DIAGNOSIS — L301 Dyshidrosis [pompholyx]: Secondary | ICD-10-CM

## 2021-12-05 DIAGNOSIS — Z8719 Personal history of other diseases of the digestive system: Secondary | ICD-10-CM

## 2021-12-05 DIAGNOSIS — M3509 Sicca syndrome with other organ involvement: Secondary | ICD-10-CM

## 2021-12-05 DIAGNOSIS — Z9049 Acquired absence of other specified parts of digestive tract: Secondary | ICD-10-CM

## 2021-12-05 DIAGNOSIS — Z8639 Personal history of other endocrine, nutritional and metabolic disease: Secondary | ICD-10-CM

## 2021-12-05 DIAGNOSIS — I10 Essential (primary) hypertension: Secondary | ICD-10-CM

## 2021-12-05 DIAGNOSIS — G8929 Other chronic pain: Secondary | ICD-10-CM

## 2021-12-05 DIAGNOSIS — Z87442 Personal history of urinary calculi: Secondary | ICD-10-CM

## 2021-12-05 DIAGNOSIS — Z818 Family history of other mental and behavioral disorders: Secondary | ICD-10-CM

## 2021-12-05 DIAGNOSIS — E782 Mixed hyperlipidemia: Secondary | ICD-10-CM

## 2021-12-05 DIAGNOSIS — E559 Vitamin D deficiency, unspecified: Secondary | ICD-10-CM

## 2021-12-05 DIAGNOSIS — M791 Myalgia, unspecified site: Secondary | ICD-10-CM

## 2022-01-09 LAB — HM MAMMOGRAPHY

## 2022-01-16 ENCOUNTER — Telehealth: Payer: Self-pay | Admitting: Nurse Practitioner

## 2022-01-16 NOTE — Telephone Encounter (Signed)
Pt states she started to have abd discomfort on Monday and symptoms have only gotten worse. Pt has intermittent LLQ abd pain that is tender to the touch. Pt took tylenol on Tuesday but it didn't help abd pain. Pt denies fever, nausea, or any urinary symptoms. Pt states that when her pain started she had a couple of loose bowel movements but pt has since had a formed bm. Pt states she has been eating more strawberries which she thinks may have caused symptoms. Pt reports this pain feels like the last time she was treated for diverticulitis. F/u moved to 01/23/22 at 1:30 with [facility]. Does pt need this appt?

## 2022-01-16 NOTE — Telephone Encounter (Signed)
Patient called in stating she thinks she is having a diverticulitis flare.  She is experiencing tenderness and pain in her LLQ.  Scheduled an appointment with Marchelle Folks for 8/14, but she doesn't feel like she can wait that long.  Please call patient and advise.  Thank you.

## 2022-01-17 NOTE — Telephone Encounter (Signed)
Checking back since she has not heard from office.

## 2022-01-18 ENCOUNTER — Other Ambulatory Visit: Payer: Self-pay

## 2022-01-18 MED ORDER — METRONIDAZOLE 500 MG PO TABS: 500.0000 mg | ORAL_TABLET | Freq: Three times a day (TID) | ORAL | 0 refills | Status: DC

## 2022-01-18 MED ORDER — CIPROFLOXACIN HCL 250 MG PO TABS: 250.0000 mg | ORAL_TABLET | Freq: Two times a day (BID) | ORAL | 0 refills | Status: DC

## 2022-01-18 NOTE — Telephone Encounter (Signed)
Pt states she has never had a scan showing diverticulitis but she has had scans showing diverticulosis. Prescriptions sent to pt's pharmacy and pt aware she should keep follow up appt.

## 2022-01-22 ENCOUNTER — Encounter: Payer: Self-pay | Admitting: Physician Assistant

## 2022-01-23 ENCOUNTER — Ambulatory Visit: Payer: BC Managed Care – PPO | Admitting: Nurse Practitioner

## 2022-02-06 ENCOUNTER — Encounter (INDEPENDENT_AMBULATORY_CARE_PROVIDER_SITE_OTHER): Payer: Self-pay

## 2022-02-07 NOTE — Progress Notes (Signed)
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  She states over the past several months she has changed her diet.  She has noticed improvement in her dry mouth and dry eyes.  She has been using over-the-counter products.  She tried pilocarpine but it caused excessive drooling.  She states her dry skin has improved as well.  She is not having any problems with eczema.  She denies any increased shortness of breath.  She is denies any history of oral ulcers, nasal ulcers, malar rash, photosensitivity, Raynaud's phenomenon or lymphadenopathy.  There is no history of inflammatory arthritis.  She states the myalgias she was experiencing have resolved.  She continues to have some thoracic pain for which she goes to the chiropractor and it improves.  Activities of Daily Living:  Patient reports morning stiffness for 0 minutes.   Patient Denies nocturnal pain.  Difficulty dressing/grooming: Denies Difficulty climbing stairs: Denies Difficulty getting out of chair: Denies Difficulty using hands for taps, buttons, cutlery, and/or writing: Denies  Review of Systems  Constitutional:  Negative for fatigue.  HENT:  Negative for mouth sores and mouth dryness.   Eyes:  Negative for dryness.  Respiratory:  Negative for shortness of breath.   Cardiovascular:  Negative for chest pain and palpitations.  Gastrointestinal:  Negative for blood in stool, constipation and diarrhea.  Endocrine: Negative for increased urination.  Genitourinary:  Negative for involuntary urination.  Musculoskeletal:  Negative for joint pain,  gait problem, joint pain, joint swelling, myalgias, muscle weakness, morning stiffness, muscle tenderness and myalgias.  Skin:  Negative for color change, rash, hair loss and sensitivity to sunlight.  Allergic/Immunologic: Negative for susceptible to infections.  Neurological:  Negative for dizziness and headaches.  Hematological:  Negative for swollen glands.  Psychiatric/Behavioral:  Negative for depressed mood and sleep disturbance. The patient is not nervous/anxious.     PMFS History:  Patient Active Problem List   Diagnosis Date Noted   Physical deconditioning 09/11/2019   Abnormal finding on radiology exam- possible emphasema findings on CT abd/pelvis by urology 09/11/2019   Kidney stone 09/08/2019   Asymptomatic microscopic hematuria 06/03/2018   History of nephrolithiasis- not requiring surgery 06/03/2018   Prediabetes 06/03/2018   Chronic right-sided thoracic back pain-muscle spasms medial to right scapula and inferior to right scapula 03/18/2018   Night muscle spasms-bilateral calves and hamstrings. 03/18/2018   Glucose intolerance (impaired glucose tolerance) 03/18/2018   Urine discoloration 03/18/2018   Dehydration, mild 03/18/2018   Chronic bilateral thoracic back pain 09/26/2017   Myalgia 09/26/2017   Plantar fascial fibromatosis of right foot 11/10/2016   Muscle cramps in legs b/l 11/10/2016   Salt craving 06/11/2016   New Onset- Essential hypertension 06/01/2016   Bilateral impacted cerumen 06/01/2016   Person with feared health complaint in whom no diagnosis is made 06/01/2016   Seborrheic dermatitis- ears, scalp 06/01/2016   Counseling on health promotion and disease prevention 06/01/2016   Vitamin D insufficiency 03/26/2016   History of laparoscopic  cholecystectomy 02/25/2016   History of smoking 30 pack years- Quit 9/ 1/ 96 02/25/2016   Family history of suicide- father age 45 02/25/2016   Family history of alcoholism in father; grandfathers etc 02/25/2016    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   Personal history of noncompliance with medical treatment and regimen 02/25/2016   Elevated blood pressure reading without diagnosis of hypertension 02/25/2016   Incisional hernia 09/15/2014   Incisional hernia, without obstruction or gangrene 08/08/2014   Allergic rhinitis/ seasonal allergies 05/17/2014   DUB (dysfunctional uterine bleeding) 05/17/2014   HLD (hyperlipidemia) 05/17/2014   Elevated fasting blood sugar 05/17/2014   Class 2 obesity in adult 05/17/2014   IFG (impaired fasting glucose) 05/17/2014   Menopausal symptoms 05/17/2014   Shoulder joint pain 05/17/2014   Morbid obesity (HCC) 09/14/2013   h/o Hypothyroidism 09/04/2007   h/o Hemorrhoids, internal, with bleeding 09/04/2007   h/o Hemorrhoids, external 09/04/2007   History of diverticulosis 09/04/2007    Past Medical History:  Diagnosis Date   Complication of anesthesia    hard to wake up once   Diverticulosis of colon    Eczema    Frequency of urination    History of hypothyroidism    10-06-2019  per pt no medication needed in several yrs, lab work normal   History of kidney stones    Hypertension    10-06-2019 per pt her pcp stopped medication 12/ 2019 (note in epic)  due to pt lost weight and bp improved;  currently per pt she is monitoring bp at home for pcp   Mixed hyperlipidemia    Pre-diabetes    followed by pcp, watching diet   Renal calculus, right    Sleep apnea    Urgency of urination    Varicose vein of leg    Vitamin D deficiency     Family History  Problem Relation Age of Onset   Cancer Mother        Lung   Suicidality Father    Alcohol abuse Father    Cancer Brother        mouth   Cancer Maternal Grandmother    Breast cancer Maternal Grandmother    Alcohol abuse Maternal Grandfather    Urolithiasis Son    Hyperlipidemia Paternal Grandmother    Hypertension Paternal Grandmother    Alcohol abuse Paternal Grandfather    Colon cancer Neg Hx     Colon polyps Neg Hx    Esophageal cancer Neg Hx    Rectal cancer Neg Hx    Stomach cancer Neg Hx    Past Surgical History:  Procedure Laterality Date   CESAREAN SECTION  x2  last one 1994   CHOLECYSTECTOMY  07/21/2012   Procedure: LAPAROSCOPIC CHOLECYSTECTOMY WITH INTRAOPERATIVE CHOLANGIOGRAM;  Surgeon: Emelia Loron, MD;  Location: [facility];  Service: General;  Laterality: N/A;  attempted   CHOLECYSTECTOMY  07/21/2012   Procedure: CHOLECYSTECTOMY;  Surgeon: Emelia Loron, MD;  Location: [facility];  Service: General;  Laterality: N/A;   COLONOSCOPY  last one 03-21-2017   CYSTOSCOPY/URETEROSCOPY/HOLMIUM LASER/STENT PLACEMENT Right 10/13/2019   Procedure: CYSTOSCOPY RIGHT RETROGRADE PYELOGRAM URETEROSCOPY/HOLMIUM LASER/STENT PLACEMENT;  Surgeon: Crista Elliot, MD;  Location: [facility];  Service: Urology;  Laterality: Right;   EXTRACORPOREAL SHOCK WAVE LITHOTRIPSY Right 11/02/2018   Procedure: EXTRACORPOREAL SHOCK WAVE LITHOTRIPSY (ESWL);  Surgeon: Crista Elliot, MD;  Location: [facility];  Service: Urology;  Laterality: Right;   FOOT SURGERY  1990s   ganglion cyst removed  from right foot   INCISIONAL HERNIA REPAIR N/A 09/15/2014   Procedure: LAPAROSCOPIC INCISIONAL HERNIA REPAIR WITH MESH;  Surgeon: Emelia Loron, MD;  Location: [facility];  Service: General;  Laterality: N/A;   vein strippiing  1990s   left leg   Social History   Social History Narrative   Not on file   Immunization History  Administered Date(s) Administered   PPD Test 12/09/2011     Objective: Vital Signs: BP (!) 158/86 (BP Location: Left Arm, Patient Position: Sitting, Cuff Size: Large)   Pulse 71   Resp 17   Ht 5\' 4"  (1.626 m)   Wt 258 lb 3.2 oz (117.1 kg)   LMP 08/13/2014   BMI 44.32 kg/m    Physical Exam Vitals and nursing note reviewed.  Constitutional:      Appearance: She is well-developed.  HENT:     Head: Normocephalic and atraumatic.  Eyes:     Conjunctiva/sclera:  Conjunctivae normal.  Cardiovascular:     Rate and Rhythm: Normal rate and regular rhythm.     Heart sounds: Normal heart sounds.  Pulmonary:     Effort: Pulmonary effort is normal.     Breath sounds: Normal breath sounds.  Abdominal:     General: Bowel sounds are normal.     Palpations: Abdomen is soft.  Musculoskeletal:     Cervical back: Normal range of motion.  Lymphadenopathy:     Cervical: No cervical adenopathy.  Skin:    General: Skin is warm and dry.     Capillary Refill: Capillary refill takes less than 2 seconds.  Neurological:     Mental Status: She is alert and oriented to person, place, and time.  Psychiatric:        Behavior: Behavior normal.      Musculoskeletal Exam: C-spine was in good range of motion.  She had to no tenderness over thoracic or lumbar spine.  Shoulder joints, elbow joints, wrist joints, MCPs PIPs and DIPs with good range of motion with no synovitis.  Hip joints, knee joints, ankles, MTPs and PIPs with good range of motion with no synovitis.  CDAI Exam: CDAI Score: -- Patient Global: --; Provider Global: -- Swollen: --; Tender: -- Joint Exam 02/20/2022   No joint exam has been documented for this visit   There is currently no information documented on the homunculus. Go to the Rheumatology activity and complete the homunculus joint exam.  Investigation: No additional findings.  Imaging: No results found.  Recent Labs: Lab Results  Component Value Date   WBC 4.1 05/22/2021   HGB 13.2 05/22/2021   PLT 170 05/22/2021   NA 142 05/22/2021   K 4.2 05/22/2021   CL 105 05/22/2021   CO2 29 05/22/2021   GLUCOSE 92 05/22/2021   BUN 10 05/22/2021   CREATININE 0.55 05/22/2021   BILITOT 0.5 05/22/2021   ALKPHOS 78 01/17/2021   AST 15 05/22/2021   ALT 15 05/22/2021   PROT 7.2 05/22/2021   PROT 7.0 05/22/2021   ALBUMIN 4.2 01/17/2021   CALCIUM 9.7 05/22/2021   GFRAA 115 12/10/2019   QFTBGOLDPLUS NEGATIVE 04/18/2020    Speciality  Comments: No specialty comments available.  Procedures:  No procedures performed Allergies: Patient has no known allergies.   Assessment / Plan:     Visit Diagnoses: Sjogren's syndrome with other organ involvement (HCC) - Positive ANA, positive Ro, dry mouth, dry eyes and dry skin:  -Patient states that her sicca symptoms are better with dietary modifications and  over-the-counter products.  She did not like the side effects of pilocarpine and stopped it.  Good dental hygiene was discussed.  Association of Sjogren's with ILD and lymphoma was discussed.  I will obtain following labs today.  Plan: Urinalysis, Routine w reflex microscopic, ANA, CBC with Differential/Platelet, COMPLETE METABOLIC PANEL WITH GFR, C3 and C4, Sedimentation rate, Sjogrens syndrome-A extractable nuclear antibody  Idiopathic lymphocytic interstitial pneumonitis (HCC) -her lungs were clear to auscultation.  She denies any shortness of breath.  Evaluated by Dr. Craige Cotta.  She had high-resolution CT which was stable on September 18, 2020.  She was seen by Dr. Craige Cotta in May 2022.  I advised her to schedule a follow-up appointment with Dr. Craige Cotta.  Chronic right-sided thoracic back pain-she continues to have some muscle spasms medial to right scapula and inferior to right scapula.  She states the symptoms have improved since she has been going to the chiropractor.  Essential hypertension-blood pressure was elevated today.  She was advised to monitor blood pressure closely and follow-up with her PCP.  Mixed hyperlipidemia-January 17, 2021 LDL was 114.  Patient states she has been dietary modifications since then.  Prediabetes  History of diverticulosis-patient had recent episodes of diverticulitis treated with Flagyl and Cipro.  History of hypothyroidism  History of nephrolithiasis- not requiring surgery  Vitamin D insufficiency  Other sleep apnea - She is on CPAP.  She has noticed improvement in her fatigue and insomnia since she has  been on CPAP.  Dyshidrotic hand dermatitis  Seborrheic dermatitis- ears, scalp  History of smoking 30 pack years- Quit 9/ 1/ 96  History of laparoscopic cholecystectomy  Family history of suicide- father age 30  Family history of alcoholism in father; grandfathers etc  Orders: Orders Placed This Encounter  Procedures   Urinalysis, Routine w reflex microscopic   ANA   CBC with Differential/Platelet   COMPLETE METABOLIC PANEL WITH GFR   C3 and C4   Sedimentation rate   Sjogrens syndrome-A extractable nuclear antibody   No orders of the defined types were placed in this encounter.    Follow-Up Instructions: Return in about 6 months (around 08/23/2022) for Sjogren's.   Pollyann Savoy, MD  Note - This record has been created using Animal nutritionist.  Chart creation errors have been sought, but may not always  have been located. Such creation errors do not reflect on  the standard of medical care.

## 2022-02-11 ENCOUNTER — Ambulatory Visit: Payer: BC Managed Care – PPO | Admitting: Physician Assistant

## 2022-02-20 ENCOUNTER — Encounter: Payer: Self-pay | Admitting: Rheumatology

## 2022-02-20 ENCOUNTER — Ambulatory Visit: Payer: BC Managed Care – PPO | Attending: Rheumatology | Admitting: Rheumatology

## 2022-02-20 VITALS — BP 158/86 | HR 71 | Resp 17 | Ht 64.0 in | Wt 258.2 lb

## 2022-02-20 DIAGNOSIS — Z8719 Personal history of other diseases of the digestive system: Secondary | ICD-10-CM

## 2022-02-20 DIAGNOSIS — E559 Vitamin D deficiency, unspecified: Secondary | ICD-10-CM

## 2022-02-20 DIAGNOSIS — Z818 Family history of other mental and behavioral disorders: Secondary | ICD-10-CM

## 2022-02-20 DIAGNOSIS — Z8639 Personal history of other endocrine, nutritional and metabolic disease: Secondary | ICD-10-CM

## 2022-02-20 DIAGNOSIS — M3509 Sicca syndrome with other organ involvement: Secondary | ICD-10-CM

## 2022-02-20 DIAGNOSIS — E782 Mixed hyperlipidemia: Secondary | ICD-10-CM

## 2022-02-20 DIAGNOSIS — G8929 Other chronic pain: Secondary | ICD-10-CM

## 2022-02-20 DIAGNOSIS — M546 Pain in thoracic spine: Secondary | ICD-10-CM | POA: Diagnosis not present

## 2022-02-20 DIAGNOSIS — I10 Essential (primary) hypertension: Secondary | ICD-10-CM | POA: Diagnosis not present

## 2022-02-20 DIAGNOSIS — Z87442 Personal history of urinary calculi: Secondary | ICD-10-CM

## 2022-02-20 DIAGNOSIS — R7303 Prediabetes: Secondary | ICD-10-CM

## 2022-02-20 DIAGNOSIS — J842 Lymphoid interstitial pneumonia: Secondary | ICD-10-CM | POA: Diagnosis not present

## 2022-02-20 DIAGNOSIS — L219 Seborrheic dermatitis, unspecified: Secondary | ICD-10-CM

## 2022-02-20 DIAGNOSIS — G4739 Other sleep apnea: Secondary | ICD-10-CM

## 2022-02-20 DIAGNOSIS — Z811 Family history of alcohol abuse and dependence: Secondary | ICD-10-CM

## 2022-02-20 DIAGNOSIS — L301 Dyshidrosis [pompholyx]: Secondary | ICD-10-CM

## 2022-02-20 DIAGNOSIS — Z87891 Personal history of nicotine dependence: Secondary | ICD-10-CM

## 2022-02-20 DIAGNOSIS — Z9049 Acquired absence of other specified parts of digestive tract: Secondary | ICD-10-CM

## 2022-02-20 DIAGNOSIS — M791 Myalgia, unspecified site: Secondary | ICD-10-CM

## 2022-02-21 LAB — URINALYSIS, ROUTINE W REFLEX MICROSCOPIC
Bilirubin Urine: NEGATIVE
Glucose, UA: NEGATIVE
Hgb urine dipstick: NEGATIVE
Ketones, ur: NEGATIVE
Leukocytes,Ua: NEGATIVE
Nitrite: NEGATIVE
Protein, ur: NEGATIVE
Specific Gravity, Urine: 1.006 (ref 1.001–1.035)
pH: 6.5 (ref 5.0–8.0)

## 2022-02-21 LAB — CBC WITH DIFFERENTIAL/PLATELET
Absolute Monocytes: 434 cells/uL (ref 200–950)
Basophils Absolute: 32 cells/uL (ref 0–200)
Basophils Relative: 0.9 %
Eosinophils Absolute: 119 cells/uL (ref 15–500)
Eosinophils Relative: 3.4 %
HCT: 39.5 % (ref 35.0–45.0)
Hemoglobin: 13.6 g/dL (ref 11.7–15.5)
Lymphs Abs: 1068 cells/uL (ref 850–3900)
MCH: 30.8 pg (ref 27.0–33.0)
MCHC: 34.4 g/dL (ref 32.0–36.0)
MCV: 89.6 fL (ref 80.0–100.0)
MPV: 12.3 fL (ref 7.5–12.5)
Monocytes Relative: 12.4 %
Neutro Abs: 1848 cells/uL (ref 1500–7800)
Neutrophils Relative %: 52.8 %
Platelets: 190 10*3/uL (ref 140–400)
RBC: 4.41 10*6/uL (ref 3.80–5.10)
RDW: 13.1 % (ref 11.0–15.0)
Total Lymphocyte: 30.5 %
WBC: 3.5 10*3/uL — ABNORMAL LOW (ref 3.8–10.8)

## 2022-02-21 LAB — COMPLETE METABOLIC PANEL WITH GFR
AG Ratio: 1.5 (calc) (ref 1.0–2.5)
ALT: 20 U/L (ref 6–29)
AST: 18 U/L (ref 10–35)
Albumin: 4.6 g/dL (ref 3.6–5.1)
Alkaline phosphatase (APISO): 68 U/L (ref 37–153)
BUN: 14 mg/dL (ref 7–25)
CO2: 26 mmol/L (ref 20–32)
Calcium: 9.4 mg/dL (ref 8.6–10.4)
Chloride: 103 mmol/L (ref 98–110)
Creat: 0.63 mg/dL (ref 0.50–1.05)
Globulin: 3 g/dL (calc) (ref 1.9–3.7)
Glucose, Bld: 102 mg/dL — ABNORMAL HIGH (ref 65–99)
Potassium: 4.4 mmol/L (ref 3.5–5.3)
Sodium: 140 mmol/L (ref 135–146)
Total Bilirubin: 0.5 mg/dL (ref 0.2–1.2)
Total Protein: 7.6 g/dL (ref 6.1–8.1)
eGFR: 101 mL/min/{1.73_m2} (ref >=60)

## 2022-02-21 LAB — SJOGRENS SYNDROME-A EXTRACTABLE NUCLEAR ANTIBODY: SSA (Ro) (ENA) Antibody, IgG: >8 AI — AB

## 2022-02-21 LAB — C3 AND C4
C3 Complement: 174 mg/dL (ref 83–193)
C4 Complement: 27 mg/dL (ref 15–57)

## 2022-02-21 LAB — SEDIMENTATION RATE: Sed Rate: 22 mm/h (ref 0–30)

## 2022-02-21 LAB — ANA: Anti Nuclear Antibody (ANA): NEGATIVE

## 2022-02-22 NOTE — Progress Notes (Signed)
White cell count is mildly low.  CMP is normal.  UA is negative.  ANA negative, complements normal ,sed rate normal, anti-Ro antibodies positive.  No change in treatment advised.

## 2022-06-03 ENCOUNTER — Encounter (HOSPITAL_BASED_OUTPATIENT_CLINIC_OR_DEPARTMENT_OTHER): Payer: Self-pay | Admitting: Pulmonary Disease

## 2022-06-03 ENCOUNTER — Ambulatory Visit (HOSPITAL_BASED_OUTPATIENT_CLINIC_OR_DEPARTMENT_OTHER): Payer: BC Managed Care – PPO | Admitting: Pulmonary Disease

## 2022-06-03 VITALS — BP 122/74 | HR 66 | Temp 98.5°F | Ht 64.0 in | Wt 267.4 lb

## 2022-06-03 DIAGNOSIS — G4733 Obstructive sleep apnea (adult) (pediatric): Secondary | ICD-10-CM | POA: Diagnosis not present

## 2022-06-03 DIAGNOSIS — J984 Other disorders of lung: Secondary | ICD-10-CM

## 2022-06-03 NOTE — Patient Instructions (Signed)
Follow up in 1 year.

## 2022-06-03 NOTE — Progress Notes (Signed)
[facility]  Chief Complaint  Patient presents with   Follow-up    Pt states no new issues since LOV over a year ago.    Constitutional:  BP 122/74 (BP Location: Left Arm, Patient Position: Sitting, Cuff Size: Large)   Pulse 66   Temp 98.5 F (36.9 C) (Oral)   Ht 5\' 4"  (1.626 m)   Wt 267 lb 6.4 oz (121.3 kg)   LMP 08/13/2014   SpO2 99%   BMI 45.90 kg/m   Past Medical History:  Diverticulosis, Eczema, Nephrolithiasis, HTN, HLD, Pre-DM, Varicose veins, Vit D deficiency  Past Surgical History:  Her  has a past surgical history that includes Cesarean section (x2  last one 1994); Foot surgery (1990s); vein strippiing (1990s); Incisional hernia repair (N/A, 09/15/2014); Colonoscopy (last one 03-21-2017); Cholecystectomy (07/21/2012); Cholecystectomy (07/21/2012); Extracorporeal shock wave lithotripsy (Right, 11/02/2018); and Cystoscopy/ureteroscopy/holmium laser/stent placement (Right, 10/13/2019).  Brief Summary:  Tiffany Glover is a 61 y.o. female former smoker with obstructive sleep apnea and lung cysts in the setting of Sjogren's syndrome.      Subjective:   Breathing has been doing well.  Not having cough, wheeze, sputum, chest pain.  Denies fever, sweats, skin rash, joint swelling, difficulty swallowing.  Uses CPAP nightly.  Has nasal pillow mask.  No issue with mask fit.  Feels rested.   Physical Exam:   Appearance - well kempt   ENMT - no sinus tenderness, no oral exudate, no LAN, Mallampati 4 airway, no stridor, scalloped tongue  Respiratory - equal breath sounds bilaterally, no wheezing or rales  CV - s1s2 regular rate and rhythm, no murmurs  Ext - no clubbing, no edema  Skin - no rashes  Psych - normal mood and affect    Pulmonary testing:  Serology 12/01/19 >> positive ANA, SSA Ab 7.9. IgG/IgA/IgM 12/01/19 >> normal A1AT 12/01/19 >> 146, MM PFT 04/19/20 >> FEV1 2.02 (78%), FEV1% 82, TLC 4.72 (93%), DLCO 102%, no  BD  Chest Imaging:  CT chest 09/08/19 >> scattered cysts increased compared to imaging from 2013 HRCT chest 09/18/20 >> diffuse bronchial wall thickening, numerous thin walled cysts stable since 2021, minimal subpleural reticular opacity and GGO at Rt lung base unchanged  Sleep Tests:  HST 01/12/20 >> AHI 28.5, SpO2 low 76%. Auto CPAP 05/01/22 to 05/30/22 >> used on 30 of 30 nights with average 8 hrs 7 min.  Average AHI 1.5 with median CPAP 9 and 95 th percentile CPAP 12 cm H2O  Social History:  She  reports that she quit smoking about 27 years ago. Her smoking use included cigarettes. She has a 30.00 pack-year smoking history. She has been exposed to tobacco smoke. She has never used smokeless tobacco. She reports current alcohol use. She reports that she does not use drugs.  Family History:  Her family history includes Alcohol abuse in her father, maternal grandfather, and paternal grandfather; Breast cancer in her maternal grandmother; Cancer in her brother, maternal grandmother, and mother; Hyperlipidemia in her paternal grandmother; Hypertension in her paternal grandmother; Suicidality in her father; Urolithiasis in her son.      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 - these have remained stable on CT chest - she doesn't have respiratory symptoms - she has minimal respiratory symptoms - difficult to determine if these are post infectious versus related to Sjogren's syndrome  - we discussed different approaches to monitoring; at this time she is comfortable with clinical monitoring and  set up additional testing if symptoms progress  Sjogren's syndrome. - has positive ANA and SSA antibody - seen by Dr. Corliss Skains with rheumatology  Obstructive sleep apnea. - she is compliant with CPAP and reports benefit from therapy - she uses [employer] for her DME - current CPAP ordered July 2021 - continue auto CPAP 5 to 15 cm H2O    Obesity. - encouraged her to keep up with  weight loss efforts  Time Spent Involved in Patient Care on Day of Examination:  35 minutes  Follow up:   Patient Instructions  Follow up in 1 year  Medication List:   Allergies as of 06/03/2022   No Known Allergies      Medication List        Accurate as of June 03, 2022  8:51 AM. If you have any questions, ask your nurse or doctor.          STOP taking these medications    acetaminophen 500 MG tablet Commonly known as: TYLENOL Stopped by: Coralyn Helling, MD   ciprofloxacin 250 MG tablet Commonly known as: Cipro Stopped by: Coralyn Helling, MD   cyclobenzaprine 10 MG tablet Commonly known as: FLEXERIL Stopped by: Coralyn Helling, MD   metroNIDAZOLE 500 MG tablet Commonly known as: FLAGYL Stopped by: Coralyn Helling, MD       TAKE these medications    multivitamin tablet Take 1 tablet by mouth daily.   pilocarpine 5 MG tablet Commonly known as: SALAGEN Take 1 tablet (5 mg total) by mouth 3 (three) times daily as needed.   triamcinolone cream 0.5 % Commonly known as: KENALOG Apply 1 application topically 2 (two) times daily. To affected areas. What changed:  when to take this reasons to take this additional instructions   Vitamin D3 125 MCG (5000 UT) Tabs 5,000 IU OTC vitamin D3 daily. What changed:  how much to take how to take this when to take this        Signature:  Coralyn Helling, MD [facility] Pager - [phone] 06/03/2022, 8:51 AM

## 2022-08-13 NOTE — Progress Notes (Deleted)
Office Visit Note  Patient: Tiffany Glover             Date of Birth: 10/02/1960           MRN: [id number]             PCP: Lorrene Reid, PA-C Referring: Lorrene Reid, PA-C Visit Date: 08/27/2022 Occupation: @GUAROCC$ @  Subjective:  No chief complaint on file.   History of Present Illness: Tiffany Glover is a 62 y.o. female ***     Activities of Daily Living:  Patient reports morning stiffness for *** {minute/hour:19697}.   Patient {ACTIONS;DENIES/REPORTS:21021675::"Denies"} nocturnal pain.  Difficulty dressing/grooming: {ACTIONS;DENIES/REPORTS:21021675::"Denies"} Difficulty climbing stairs: {ACTIONS;DENIES/REPORTS:21021675::"Denies"} Difficulty getting out of chair: {ACTIONS;DENIES/REPORTS:21021675::"Denies"} Difficulty using hands for taps, buttons, cutlery, and/or writing: {ACTIONS;DENIES/REPORTS:21021675::"Denies"}  No Rheumatology ROS completed.   PMFS History:  Patient Active Problem List   Diagnosis Date Noted   Physical deconditioning 09/11/2019   Abnormal finding on radiology exam- possible emphasema findings on CT abd/pelvis by urology 09/11/2019   Kidney stone 09/08/2019   Asymptomatic microscopic hematuria 06/03/2018   History of nephrolithiasis- not requiring surgery 06/03/2018   Prediabetes 06/03/2018   Chronic right-sided thoracic back pain-muscle spasms medial to right scapula and inferior to right scapula 03/18/2018   Night muscle spasms-bilateral calves and hamstrings. 03/18/2018   Glucose intolerance (impaired glucose tolerance) 03/18/2018   Urine discoloration 03/18/2018   Dehydration, mild 03/18/2018   Chronic bilateral thoracic back pain 09/26/2017   Myalgia 09/26/2017   Plantar fascial fibromatosis of right foot 11/10/2016   Muscle cramps in legs b/l 11/10/2016   Salt craving 06/11/2016   New Onset- Essential hypertension 06/01/2016   Bilateral impacted cerumen 06/01/2016   Person with feared health complaint in whom no diagnosis is made  06/01/2016   Seborrheic dermatitis- ears, scalp 06/01/2016   Counseling on health promotion and disease prevention 06/01/2016   Vitamin D insufficiency 03/26/2016   History of laparoscopic cholecystectomy 02/25/2016   History of smoking 30 pack years- Quit 9/ 1/ 96 02/25/2016   Family history of suicide- father age 73 02/25/2016   Family history of alcoholism in father; grandfathers etc 02/25/2016   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   Personal history of noncompliance with medical treatment and regimen 02/25/2016   Elevated blood pressure reading without diagnosis of hypertension 02/25/2016   Incisional hernia 09/15/2014   Incisional hernia, without obstruction or gangrene 08/08/2014   Allergic rhinitis/ seasonal allergies 05/17/2014   DUB (dysfunctional uterine bleeding) 05/17/2014   HLD (hyperlipidemia) 05/17/2014   Elevated fasting blood sugar 05/17/2014   Class 2 obesity in adult 05/17/2014   IFG (impaired fasting glucose) 05/17/2014   Menopausal symptoms 05/17/2014   Shoulder joint pain 05/17/2014   Morbid obesity ([place]) 09/14/2013   h/o Hypothyroidism 09/04/2007   h/o Hemorrhoids, internal, with bleeding 09/04/2007   h/o Hemorrhoids, external 09/04/2007   History of diverticulosis 09/04/2007    Past Medical History:  Diagnosis Date   Complication of anesthesia    hard to wake up once   Diverticulosis of colon    Eczema    Frequency of urination    History of hypothyroidism    10-06-2019  per pt no medication needed in several yrs, lab work normal   History of kidney stones    Hypertension    10-06-2019 per pt her pcp stopped medication 12/ 2019 (note in epic)  due to pt lost weight and bp improved;  currently per pt she is monitoring bp at home for pcp   Mixed  hyperlipidemia    Pre-diabetes    followed by pcp, watching diet   Renal calculus, right    Sleep apnea    Urgency of urination    Varicose vein of leg    Vitamin D deficiency     Family History   Problem Relation Age of Onset   Cancer Mother        Lung   Suicidality Father    Alcohol abuse Father    Cancer Brother        mouth   Cancer Maternal Grandmother    Breast cancer Maternal Grandmother    Alcohol abuse Maternal Grandfather    Urolithiasis Son    Hyperlipidemia Paternal Grandmother    Hypertension Paternal Grandmother    Alcohol abuse Paternal Grandfather    Colon cancer Neg Hx    Colon polyps Neg Hx    Esophageal cancer Neg Hx    Rectal cancer Neg Hx    Stomach cancer Neg Hx    Past Surgical History:  Procedure Laterality Date   CESAREAN SECTION  x2  last one 1994   CHOLECYSTECTOMY  07/21/2012   Procedure: LAPAROSCOPIC CHOLECYSTECTOMY WITH INTRAOPERATIVE CHOLANGIOGRAM;  Surgeon: Rolm Bookbinder, MD;  Location: [facility];  Service: General;  Laterality: N/A;  attempted   CHOLECYSTECTOMY  07/21/2012   Procedure: CHOLECYSTECTOMY;  Surgeon: Rolm Bookbinder, MD;  Location: [facility];  Service: General;  Laterality: N/A;   COLONOSCOPY  last one 03-21-2017   CYSTOSCOPY/URETEROSCOPY/HOLMIUM LASER/STENT PLACEMENT Right 10/13/2019   Procedure: CYSTOSCOPY RIGHT RETROGRADE PYELOGRAM URETEROSCOPY/HOLMIUM LASER/STENT PLACEMENT;  Surgeon: Lucas Mallow, MD;  Location: [facility];  Service: Urology;  Laterality: Right;   EXTRACORPOREAL SHOCK WAVE LITHOTRIPSY Right 11/02/2018   Procedure: EXTRACORPOREAL SHOCK WAVE LITHOTRIPSY (ESWL);  Surgeon: Lucas Mallow, MD;  Location: [facility];  Service: Urology;  Laterality: Right;   FOOT SURGERY  1990s   ganglion cyst removed from right foot   INCISIONAL HERNIA REPAIR N/A 09/15/2014   Procedure: LAPAROSCOPIC INCISIONAL HERNIA REPAIR WITH MESH;  Surgeon: Rolm Bookbinder, MD;  Location: [place];  Service: General;  Laterality: N/A;   vein strippiing  1990s   left leg   Social History   Social History Narrative   Not on file   Immunization History  Administered Date(s) Administered   PPD Test 12/09/2011      Objective: Vital Signs: LMP 08/13/2014    Physical Exam   Musculoskeletal Exam: ***  CDAI Exam: CDAI Score: -- Patient Global: --; Provider Global: -- Swollen: --; Tender: -- Joint Exam 08/27/2022   No joint exam has been documented for this visit   There is currently no information documented on the homunculus. Go to the Rheumatology activity and complete the homunculus joint exam.  Investigation: No additional findings.  Imaging: No results found.  Recent Labs: Lab Results  Component Value Date   WBC 3.5 (L) 02/20/2022   HGB 13.6 02/20/2022   PLT 190 02/20/2022   NA 140 02/20/2022   K 4.4 02/20/2022   CL 103 02/20/2022   CO2 26 02/20/2022   GLUCOSE 102 (H) 02/20/2022   BUN 14 02/20/2022   CREATININE 0.63 02/20/2022   BILITOT 0.5 02/20/2022   ALKPHOS 78 01/17/2021   AST 18 02/20/2022   ALT 20 02/20/2022   PROT 7.6 02/20/2022   ALBUMIN 4.2 01/17/2021   CALCIUM 9.4 02/20/2022   GFRAA 115 12/10/2019   QFTBGOLDPLUS NEGATIVE 04/18/2020    Speciality Comments: No specialty comments available.  Procedures:  No procedures performed Allergies: Patient has no known allergies.   Assessment / Plan:     Visit Diagnoses: Sjogren's syndrome with other organ involvement ([place])  Idiopathic lymphocytic interstitial pneumonitis (HCC)  Essential hypertension  Mixed hyperlipidemia  Prediabetes  History of diverticulosis  History of hypothyroidism  History of nephrolithiasis- not requiring surgery  Vitamin D insufficiency  Other sleep apnea  Dyshidrotic hand dermatitis  Seborrheic dermatitis- ears, scalp  History of smoking 30 pack years- Quit 9/ 1/ 96  History of laparoscopic cholecystectomy  Family history of suicide- father age 90  Family history of alcoholism in father; grandfathers etc  Orders: No orders of the defined types were placed in this encounter.  No orders of the defined types were placed in this encounter.   Face-to-face time  spent with patient was *** minutes. Greater than 50% of time was spent in counseling and coordination of care.  Follow-Up Instructions: No follow-ups on file.   Ofilia Neas, PA-C  Note - This record has been created using Dragon software.  Chart creation errors have been sought, but may not always  have been located. Such creation errors do not reflect on  the standard of medical care.

## 2022-08-15 ENCOUNTER — Encounter: Payer: Self-pay | Admitting: Emergency Medicine

## 2022-08-15 ENCOUNTER — Other Ambulatory Visit: Payer: Self-pay

## 2022-08-15 ENCOUNTER — Ambulatory Visit
Admission: EM | Admit: 2022-08-15 | Discharge: 2022-08-15 | Disposition: A | Discharge status: Home / Self Care | Payer: BC Managed Care – PPO | Attending: Physician Assistant | Admitting: Physician Assistant

## 2022-08-15 DIAGNOSIS — R1013 Epigastric pain: Secondary | ICD-10-CM

## 2022-08-15 DIAGNOSIS — R0789 Other chest pain: Secondary | ICD-10-CM | POA: Diagnosis not present

## 2022-08-15 DIAGNOSIS — M3500 Sicca syndrome, unspecified: Secondary | ICD-10-CM

## 2022-08-15 HISTORY — DX: Sjogren syndrome, unspecified: M35.00

## 2022-08-15 MED ORDER — FAMOTIDINE 20 MG PO TABS
20.0000 mg | ORAL_TABLET | Freq: Two times a day (BID) | ORAL | 0 refills | Status: DC
Start: 2022-08-15 — End: 2023-04-28

## 2022-08-15 NOTE — ED Provider Notes (Signed)
[name]    CSN: [id number] Arrival date & time: 08/15/22  0856      History   Chief Complaint Chief Complaint  Patient presents with   Chest Pain    HPI Tiffany Glover is a 62 y.o. female.   62 year old female presents with chest pain.  Patient indicates that yesterday morning she started having left chest wall pressure, tenderness, heavy sensation.  She indicates that this has been present all day yesterday, last night and into this morning.  She indicates that the pressure and discomfort is located mid left chest wall at the upper part of the breast area.  She indicates that it does not radiate.  She relates she is not having shortness of breath, nausea, or sweating with the pressure.  She indicates she can walk up and down steps without the pressure or discomfort change in any.  She does indicates she has had some left arm weakness and numbness sensation of the forearm only which tends to be intermittent.  She indicates that her symptoms started after she drank kale smoothie, and shortly afterwards she started having increased stomach discomfort bloating and increased gas and belching.  She indicates she has not had any heartburn or indigestion.  She relates she has not had any trauma to the chest wall, and has not noticed any rash on the skin.  Patient does not smoke.  She indicates she does not have any family members with heart attacks before the age of 49.  She is tolerating fluids well.   Chest Pain   Past Medical History:  Diagnosis Date   Complication of anesthesia    hard to wake up once   Diverticulosis of colon    Eczema    Frequency of urination    History of hypothyroidism    10-06-2019  per pt no medication needed in several yrs, lab work normal   History of kidney stones    Hypertension    10-06-2019 per pt her pcp stopped medication 12/ 2019 (note in epic)  due to pt lost weight and bp improved;  currently per pt she is monitoring bp at home for  pcp   Mixed hyperlipidemia    Pre-diabetes    followed by pcp, watching diet   Renal calculus, right    Sjogren syndrome, unspecified (HCC)    Sleep apnea    Urgency of urination    Varicose vein of leg    Vitamin D deficiency     Patient Active Problem List   Diagnosis Date Noted   Sjogren syndrome, unspecified ([place]) 08/15/2022   Physical deconditioning 09/11/2019   Abnormal finding on radiology exam- possible emphasema findings on CT abd/pelvis by urology 09/11/2019   Kidney stone 09/08/2019   Asymptomatic microscopic hematuria 06/03/2018   History of nephrolithiasis- not requiring surgery 06/03/2018   Prediabetes 06/03/2018   Chronic right-sided thoracic back pain-muscle spasms medial to right scapula and inferior to right scapula 03/18/2018   Night muscle spasms-bilateral calves and hamstrings. 03/18/2018   Glucose intolerance (impaired glucose tolerance) 03/18/2018   Urine discoloration 03/18/2018   Dehydration, mild 03/18/2018   Chronic bilateral thoracic back pain 09/26/2017   Myalgia 09/26/2017   Plantar fascial fibromatosis of right foot 11/10/2016   Muscle cramps in legs b/l 11/10/2016   Salt craving 06/11/2016   New Onset- Essential hypertension 06/01/2016   Bilateral impacted cerumen 06/01/2016   Person with feared health complaint in whom no diagnosis is made 06/01/2016   Seborrheic dermatitis-  ears, scalp 06/01/2016   Counseling on health promotion and disease prevention 06/01/2016   Vitamin D insufficiency 03/26/2016   History of laparoscopic cholecystectomy 02/25/2016   History of smoking 30 pack years- Quit 9/ 1/ 96 02/25/2016   Family history of suicide- father age 59 02/25/2016   Family history of alcoholism in father; grandfathers etc 02/25/2016   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   Personal history of noncompliance with medical treatment and regimen 02/25/2016   Elevated blood pressure reading without diagnosis of hypertension 02/25/2016    Incisional hernia 09/15/2014   Incisional hernia, without obstruction or gangrene 08/08/2014   Allergic rhinitis/ seasonal allergies 05/17/2014   DUB (dysfunctional uterine bleeding) 05/17/2014   HLD (hyperlipidemia) 05/17/2014   Elevated fasting blood sugar 05/17/2014   Class 2 obesity in adult 05/17/2014   IFG (impaired fasting glucose) 05/17/2014   Menopausal symptoms 05/17/2014   Shoulder joint pain 05/17/2014   Morbid obesity ([place]) 09/14/2013   h/o Hypothyroidism 09/04/2007   h/o Hemorrhoids, internal, with bleeding 09/04/2007   h/o Hemorrhoids, external 09/04/2007   History of diverticulosis 09/04/2007    Past Surgical History:  Procedure Laterality Date   CESAREAN SECTION  x2  last one 1994   CHOLECYSTECTOMY  07/21/2012   Procedure: LAPAROSCOPIC CHOLECYSTECTOMY WITH INTRAOPERATIVE CHOLANGIOGRAM;  Surgeon: Rolm Bookbinder, MD;  [facility];  Service: General;  Laterality: N/A;  attempted   CHOLECYSTECTOMY  07/21/2012   Procedure: CHOLECYSTECTOMY;  Surgeon: Rolm Bookbinder, MD;  [facility];  Service: General;  Laterality: N/A;   COLONOSCOPY  last one 03-21-2017   CYSTOSCOPY/URETEROSCOPY/HOLMIUM LASER/STENT PLACEMENT Right 10/13/2019   Procedure: CYSTOSCOPY RIGHT RETROGRADE PYELOGRAM URETEROSCOPY/HOLMIUM LASER/STENT PLACEMENT;  Surgeon: Lucas Mallow, MD;  Location: [place];  Service: Urology;  Laterality: Right;   EXTRACORPOREAL SHOCK WAVE LITHOTRIPSY Right 11/02/2018   Procedure: EXTRACORPOREAL SHOCK WAVE LITHOTRIPSY (ESWL);  Surgeon: Lucas Mallow, MD;  [facility];  Service: Urology;  Laterality: Right;   FOOT SURGERY  1990s   ganglion cyst removed from right foot   INCISIONAL HERNIA REPAIR N/A 09/15/2014   Procedure: LAPAROSCOPIC INCISIONAL HERNIA REPAIR WITH MESH;  Surgeon: Rolm Bookbinder, MD;  Location: [place];  Service: General;  Laterality: N/A;   vein strippiing  1990s   left leg    OB History   No obstetric history on  file.      Home Medications    Prior to Admission medications   Medication Sig Start Date End Date Taking? Authorizing Provider  famotidine (PEPCID) 20 MG tablet Take 1 tablet (20 mg total) by mouth 2 (two) times daily. 08/15/22  Yes Nyoka Lint, PA-C  Cholecalciferol (VITAMIN D3) 5000 units TABS 5,000 IU OTC vitamin D3 daily. Patient taking differently: Take 1 tablet by mouth daily. 5,000 IU OTC vitamin D3 daily. 07/11/16   Mellody Dance, DO  Multiple Vitamin (MULTIVITAMIN) tablet Take 1 tablet by mouth daily.    [provider]  pilocarpine (SALAGEN) 5 MG tablet Take 1 tablet (5 mg total) by mouth 3 (three) times daily as needed. Patient not taking: Reported on 08/15/2022 05/22/21   Ofilia Neas, PA-C  triamcinolone cream (KENALOG) 0.5 % Apply 1 application topically 2 (two) times daily. To affected areas. Patient taking differently: Apply 1 application  topically 2 (two) times daily as needed. To affected areas as needed 02/13/16   Mellody Dance, DO    Family History Family History  Problem Relation Age of Onset   Cancer Mother  Lung   Suicidality Father    Alcohol abuse Father    Cancer Brother        mouth   Cancer Maternal Grandmother    Breast cancer Maternal Grandmother    Alcohol abuse Maternal Grandfather    Urolithiasis Son    Hyperlipidemia Paternal Grandmother    Hypertension Paternal Grandmother    Alcohol abuse Paternal Grandfather    Colon cancer Neg Hx    Colon polyps Neg Hx    Esophageal cancer Neg Hx    Rectal cancer Neg Hx    Stomach cancer Neg Hx     Social History Social History   Tobacco Use   Smoking status: Former    Packs/day: 1.50    Years: 20.00    Total pack years: 30.00    Types: Cigarettes    Quit date: 03/02/1995    Years since quitting: 27.4    Passive exposure: Past   Smokeless tobacco: Never  Vaping Use   Vaping Use: Never used  Substance Use Topics   Alcohol use: Yes    Comment: very rare   Drug use:  Never     Allergies   Patient has no known allergies.   Review of Systems Review of Systems  Cardiovascular:  Positive for chest pain (left anterior chest wall).     Physical Exam Triage Vital Signs ED Triage Vitals  Enc Vitals Group     BP 08/15/22 0957 (!) 165/73     Pulse Rate 08/15/22 0957 76     Resp 08/15/22 0957 20     Temp 08/15/22 0957 97.9 F (36.6 C)     Temp Source 08/15/22 0957 Oral     SpO2 08/15/22 0957 98 %     Weight --      Height --      Head Circumference --      Peak Flow --      Pain Score 08/15/22 0954 5     Pain Loc --      Pain Edu? --      Excl. in [place]? --    No data found.  Updated Vital Signs BP (!) 165/73 (BP Location: Left Arm) Comment (BP Location): large cuff  Pulse 76   Temp 97.9 F (36.6 C) (Oral)   Resp 20   LMP 08/13/2014   SpO2 98%   Visual Acuity Right Eye Distance:   Left Eye Distance:   Bilateral Distance:    Right Eye Near:   Left Eye Near:    Bilateral Near:     Physical Exam Constitutional:      Appearance: She is well-developed.  HENT:     Mouth/Throat:     Mouth: Mucous membranes are moist.     Pharynx: Oropharynx is clear. No posterior oropharyngeal erythema.  Cardiovascular:     Rate and Rhythm: Normal rate and regular rhythm.     Heart sounds: Normal heart sounds.  Pulmonary:     Effort: Pulmonary effort is normal.     Breath sounds: Normal breath sounds and air entry. No wheezing, rhonchi or rales.  Abdominal:     General: Abdomen is flat. Bowel sounds are normal.     Palpations: Abdomen is soft.     Tenderness: There is abdominal tenderness (mild) in the epigastric area and left upper quadrant. There is no guarding or rebound.  Lymphadenopathy:     Cervical: No cervical adenopathy.  Neurological:     Mental Status: She is alert.  UC Treatments / Results  Labs (all labs ordered are listed, but only abnormal results are displayed) Labs Reviewed - No data to display  EKG: Normal sinus  rhythm without acute changes noted today.   Radiology No results found.  Procedures Procedures (including critical care time)  Medications Ordered in UC Medications - No data to display  Initial Impression / Assessment and Plan / UC Course  I have reviewed the triage vital signs and the nursing notes.  Pertinent labs & imaging results that were available during my care of the patient were reviewed by me and considered in my medical decision making (see chart for details).    Plan: The diagnosis will be treated with the following: 1.  Chest pain: A.  Pepcid 20 mg twice daily until completed to control chest discomfort. 2.  Dyspepsia: A.  Pepcid 20 mg twice daily until completed to control chest discomfort. B.  Advised to avoid spicy, fried, greasy foods and caffeinated drinks for the next several days till symptoms resolved. 3.  Patient advised follow-up PCP or report to emergency room if symptoms increase or change. Final Clinical Impressions(s) / UC Diagnoses   Final diagnoses:  Other chest pain  Dyspepsia     Discharge Instructions      Advised take Pepcid 20 mg twice daily until completed to help control chest discomfort, indigestion, heartburn and bloating. Advised to avoid fried, greasy, spicy foods for the next several days.  Advised follow-up PCP or if symptoms increase, change or worsen then report to the emergency room.      ED Prescriptions     Medication Sig Dispense Auth. Provider   famotidine (PEPCID) 20 MG tablet Take 1 tablet (20 mg total) by mouth 2 (two) times daily. 30 tablet Nyoka Lint, PA-C      PDMP not reviewed this encounter.   Nyoka Lint, PA-C 08/15/22 1026

## 2022-08-15 NOTE — ED Triage Notes (Signed)
Patient reports symptoms started yesterday.  Reports a aching, burning feeling in left upper arm.  Reports the same feeling in chest, predominantly left chest . Also reports arm feels weak.   Denies nausea, no vomiting, no diaphoresis, no sob.  Denies feeling this way before.    Pcp has left and no longer has a pcp  Reports this sensation as constant.  Nothing seems to make pain better or worse.  Patient was able to sleep, once she went to sleep.    Patient does not take any medicine, no prescribed medicines.

## 2022-08-15 NOTE — Discharge Instructions (Addendum)
Advised take Pepcid 20 mg twice daily until completed to help control chest discomfort, indigestion, heartburn and bloating. Advised to avoid fried, greasy, spicy foods for the next several days.  Advised follow-up PCP or if symptoms increase, change or worsen then report to the emergency room.

## 2022-08-27 ENCOUNTER — Ambulatory Visit: Payer: BC Managed Care – PPO | Admitting: Physician Assistant

## 2022-08-27 DIAGNOSIS — E559 Vitamin D deficiency, unspecified: Secondary | ICD-10-CM

## 2022-08-27 DIAGNOSIS — Z8639 Personal history of other endocrine, nutritional and metabolic disease: Secondary | ICD-10-CM

## 2022-08-27 DIAGNOSIS — Z8719 Personal history of other diseases of the digestive system: Secondary | ICD-10-CM

## 2022-08-27 DIAGNOSIS — L219 Seborrheic dermatitis, unspecified: Secondary | ICD-10-CM

## 2022-08-27 DIAGNOSIS — Z811 Family history of alcohol abuse and dependence: Secondary | ICD-10-CM

## 2022-08-27 DIAGNOSIS — E782 Mixed hyperlipidemia: Secondary | ICD-10-CM

## 2022-08-27 DIAGNOSIS — M3509 Sicca syndrome with other organ involvement: Secondary | ICD-10-CM

## 2022-08-27 DIAGNOSIS — G4739 Other sleep apnea: Secondary | ICD-10-CM

## 2022-08-27 DIAGNOSIS — Z9049 Acquired absence of other specified parts of digestive tract: Secondary | ICD-10-CM

## 2022-08-27 DIAGNOSIS — Z818 Family history of other mental and behavioral disorders: Secondary | ICD-10-CM

## 2022-08-27 DIAGNOSIS — J842 Lymphoid interstitial pneumonia: Secondary | ICD-10-CM

## 2022-08-27 DIAGNOSIS — I10 Essential (primary) hypertension: Secondary | ICD-10-CM

## 2022-08-27 DIAGNOSIS — Z87891 Personal history of nicotine dependence: Secondary | ICD-10-CM

## 2022-08-27 DIAGNOSIS — R7303 Prediabetes: Secondary | ICD-10-CM

## 2022-08-27 DIAGNOSIS — L301 Dyshidrosis [pompholyx]: Secondary | ICD-10-CM

## 2022-08-27 DIAGNOSIS — Z87442 Personal history of urinary calculi: Secondary | ICD-10-CM

## 2022-09-10 NOTE — Progress Notes (Deleted)
Office Visit Note  Patient: Tiffany Glover             Date of Birth: 04-Jan-1961           MRN: [id number]             PCP: Lorrene Reid, PA-C (Inactive) Referring: Lorrene Reid, PA-C Visit Date: 09/23/2022 Occupation: '@GUAROCC'$ @  Subjective:  No chief complaint on file.   History of Present Illness: Tiffany Glover is a 62 y.o. female ***     Activities of Daily Living:  Patient reports morning stiffness for *** {minute/hour:19697}.   Patient {ACTIONS;DENIES/REPORTS:21021675::"Denies"} nocturnal pain.  Difficulty dressing/grooming: {ACTIONS;DENIES/REPORTS:21021675::"Denies"} Difficulty climbing stairs: {ACTIONS;DENIES/REPORTS:21021675::"Denies"} Difficulty getting out of chair: {ACTIONS;DENIES/REPORTS:21021675::"Denies"} Difficulty using hands for taps, buttons, cutlery, and/or writing: {ACTIONS;DENIES/REPORTS:21021675::"Denies"}  No Rheumatology ROS completed.   PMFS History:  Patient Active Problem List   Diagnosis Date Noted   Sjogren syndrome, unspecified ([place]) 08/15/2022   Physical deconditioning 09/11/2019   Abnormal finding on radiology exam- possible emphasema findings on CT abd/pelvis by urology 09/11/2019   Kidney stone 09/08/2019   Asymptomatic microscopic hematuria 06/03/2018   History of nephrolithiasis- not requiring surgery 06/03/2018   Prediabetes 06/03/2018   Chronic right-sided thoracic back pain-muscle spasms medial to right scapula and inferior to right scapula 03/18/2018   Night muscle spasms-bilateral calves and hamstrings. 03/18/2018   Glucose intolerance (impaired glucose tolerance) 03/18/2018   Urine discoloration 03/18/2018   Dehydration, mild 03/18/2018   Chronic bilateral thoracic back pain 09/26/2017   Myalgia 09/26/2017   Plantar fascial fibromatosis of right foot 11/10/2016   Muscle cramps in legs b/l 11/10/2016   Salt craving 06/11/2016   New Onset- Essential hypertension 06/01/2016   Bilateral impacted cerumen 06/01/2016    Person with feared health complaint in whom no diagnosis is made 06/01/2016   Seborrheic dermatitis- ears, scalp 06/01/2016   Counseling on health promotion and disease prevention 06/01/2016   Vitamin D insufficiency 03/26/2016   History of laparoscopic cholecystectomy 02/25/2016   History of smoking 30 pack years- Quit 9/ 1/ 96 02/25/2016   Family history of suicide- father age 64 02/25/2016   Family history of alcoholism in father; grandfathers etc 02/25/2016   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   Personal history of noncompliance with medical treatment and regimen 02/25/2016   Elevated blood pressure reading without diagnosis of hypertension 02/25/2016   Incisional hernia 09/15/2014   Incisional hernia, without obstruction or gangrene 08/08/2014   Allergic rhinitis/ seasonal allergies 05/17/2014   DUB (dysfunctional uterine bleeding) 05/17/2014   HLD (hyperlipidemia) 05/17/2014   Elevated fasting blood sugar 05/17/2014   Class 2 obesity in adult 05/17/2014   IFG (impaired fasting glucose) 05/17/2014   Menopausal symptoms 05/17/2014   Shoulder joint pain 05/17/2014   Morbid obesity ([place]) 09/14/2013   h/o Hypothyroidism 09/04/2007   h/o Hemorrhoids, internal, with bleeding 09/04/2007   h/o Hemorrhoids, external 09/04/2007   History of diverticulosis 09/04/2007    Past Medical History:  Diagnosis Date   Complication of anesthesia    hard to wake up once   Diverticulosis of colon    Eczema    Frequency of urination    History of hypothyroidism    10-06-2019  per pt no medication needed in several yrs, lab work normal   History of kidney stones    Hypertension    10-06-2019 per pt her pcp stopped medication 12/ 2019 (note in epic)  due to pt lost weight and bp improved;  currently per pt she is monitoring  bp at home for pcp   Mixed hyperlipidemia    Pre-diabetes    followed by pcp, watching diet   Renal calculus, right    Sjogren syndrome, unspecified ([facility])    Sleep  apnea    Urgency of urination    Varicose vein of leg    Vitamin D deficiency     Family History  Problem Relation Age of Onset   Cancer Mother        Lung   Suicidality Father    Alcohol abuse Father    Cancer Brother        mouth   Cancer Maternal Grandmother    Breast cancer Maternal Grandmother    Alcohol abuse Maternal Grandfather    Urolithiasis Son    Hyperlipidemia Paternal Grandmother    Hypertension Paternal Grandmother    Alcohol abuse Paternal Grandfather    Colon cancer Neg Hx    Colon polyps Neg Hx    Esophageal cancer Neg Hx    Rectal cancer Neg Hx    Stomach cancer Neg Hx    Past Surgical History:  Procedure Laterality Date   CESAREAN SECTION  x2  last one 1994   CHOLECYSTECTOMY  07/21/2012   Procedure: LAPAROSCOPIC CHOLECYSTECTOMY WITH INTRAOPERATIVE CHOLANGIOGRAM;  Surgeon: Rolm Bookbinder, MD;  Location: [facility];  Service: General;  Laterality: N/A;  attempted   CHOLECYSTECTOMY  07/21/2012   Procedure: CHOLECYSTECTOMY;  Surgeon: Rolm Bookbinder, MD;  Location: [facility];  Service: General;  Laterality: N/A;   COLONOSCOPY  last one 03-21-2017   CYSTOSCOPY/URETEROSCOPY/HOLMIUM LASER/STENT PLACEMENT Right 10/13/2019   Procedure: CYSTOSCOPY RIGHT RETROGRADE PYELOGRAM URETEROSCOPY/HOLMIUM LASER/STENT PLACEMENT;  Surgeon: Lucas Mallow, MD;  Location: [place];  Service: Urology;  Laterality: Right;   EXTRACORPOREAL SHOCK WAVE LITHOTRIPSY Right 11/02/2018   Procedure: EXTRACORPOREAL SHOCK WAVE LITHOTRIPSY (ESWL);  Surgeon: Lucas Mallow, MD;  Location: [facility];  Service: Urology;  Laterality: Right;   FOOT SURGERY  1990s   ganglion cyst removed from right foot   INCISIONAL HERNIA REPAIR N/A 09/15/2014   Procedure: LAPAROSCOPIC INCISIONAL HERNIA REPAIR WITH MESH;  Surgeon: Rolm Bookbinder, MD;  Location: [facility];  Service: General;  Laterality: N/A;   vein strippiing  1990s   left leg   Social History   Social History Narrative   Not on  file   Immunization History  Administered Date(s) Administered   PPD Test 12/09/2011     Objective: Vital Signs: LMP 08/13/2014    Physical Exam   Musculoskeletal Exam: ***  CDAI Exam: CDAI Score: -- Patient Global: --; Provider Global: -- Swollen: --; Tender: -- Joint Exam 09/23/2022   No joint exam has been documented for this visit   There is currently no information documented on the homunculus. Go to the Rheumatology activity and complete the homunculus joint exam.  Investigation: No additional findings.  Imaging: No results found.  Recent Labs: Lab Results  Component Value Date   WBC 3.5 (L) 02/20/2022   HGB 13.6 02/20/2022   PLT 190 02/20/2022   NA 140 02/20/2022   K 4.4 02/20/2022   CL 103 02/20/2022   CO2 26 02/20/2022   GLUCOSE 102 (H) 02/20/2022   BUN 14 02/20/2022   CREATININE 0.63 02/20/2022   BILITOT 0.5 02/20/2022   ALKPHOS 78 01/17/2021   AST 18 02/20/2022   ALT 20 02/20/2022   PROT 7.6 02/20/2022   ALBUMIN 4.2 01/17/2021   CALCIUM 9.4 02/20/2022   GFRAA 115 12/10/2019  QFTBGOLDPLUS NEGATIVE 04/18/2020    Speciality Comments: No specialty comments available.  Procedures:  No procedures performed Allergies: Patient has no known allergies.   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[facility])  Chronic right-sided thoracic back pain-muscle spasms medial to right scapula and inferior to right scapula  Essential hypertension  Mixed hyperlipidemia  Prediabetes  History of diverticulosis  History of hypothyroidism  History of nephrolithiasis- not requiring surgery  Vitamin D insufficiency  Other sleep apnea  Dyshidrotic hand dermatitis  Seborrheic dermatitis- ears, scalp  History of smoking 30 pack years- Quit 9/ 1/ 96  History of laparoscopic cholecystectomy  Family history of suicide- father age 47  Family history of  alcoholism in father; grandfathers etc  Orders: No orders of the defined types were placed in this encounter.  No orders of the defined types were placed in this encounter.   Face-to-face time spent with patient was *** minutes. Greater than 50% of time was spent in counseling and coordination of care.  Follow-Up Instructions: No follow-ups on file.   Ofilia Neas, PA-C  Note - This record has been created using Dragon software.  Chart creation errors have been sought, but may not always  have been located. Such creation errors do not reflect on  the standard of medical care.

## 2022-09-23 ENCOUNTER — Ambulatory Visit: Payer: BC Managed Care – PPO | Admitting: Physician Assistant

## 2022-09-23 DIAGNOSIS — G8929 Other chronic pain: Secondary | ICD-10-CM

## 2022-09-23 DIAGNOSIS — Z87442 Personal history of urinary calculi: Secondary | ICD-10-CM

## 2022-09-23 DIAGNOSIS — Z87891 Personal history of nicotine dependence: Secondary | ICD-10-CM

## 2022-09-23 DIAGNOSIS — G4739 Other sleep apnea: Secondary | ICD-10-CM

## 2022-09-23 DIAGNOSIS — L219 Seborrheic dermatitis, unspecified: Secondary | ICD-10-CM

## 2022-09-23 DIAGNOSIS — Z8719 Personal history of other diseases of the digestive system: Secondary | ICD-10-CM

## 2022-09-23 DIAGNOSIS — I10 Essential (primary) hypertension: Secondary | ICD-10-CM

## 2022-09-23 DIAGNOSIS — Z811 Family history of alcohol abuse and dependence: Secondary | ICD-10-CM

## 2022-09-23 DIAGNOSIS — Z8639 Personal history of other endocrine, nutritional and metabolic disease: Secondary | ICD-10-CM

## 2022-09-23 DIAGNOSIS — Z818 Family history of other mental and behavioral disorders: Secondary | ICD-10-CM

## 2022-09-23 DIAGNOSIS — J842 Lymphoid interstitial pneumonia: Secondary | ICD-10-CM

## 2022-09-23 DIAGNOSIS — E559 Vitamin D deficiency, unspecified: Secondary | ICD-10-CM

## 2022-09-23 DIAGNOSIS — Z9049 Acquired absence of other specified parts of digestive tract: Secondary | ICD-10-CM

## 2022-09-23 DIAGNOSIS — M3509 Sicca syndrome with other organ involvement: Secondary | ICD-10-CM

## 2022-09-23 DIAGNOSIS — R7303 Prediabetes: Secondary | ICD-10-CM

## 2022-09-23 DIAGNOSIS — L301 Dyshidrosis [pompholyx]: Secondary | ICD-10-CM

## 2022-09-23 DIAGNOSIS — E782 Mixed hyperlipidemia: Secondary | ICD-10-CM

## 2022-09-30 NOTE — Progress Notes (Signed)
Office Visit Note  Patient: Tiffany Glover             Date of Birth: 12/10/1960           MRN: [id number]             PCP: Mayer Masker, PA-C (Inactive) Referring: Mayer Masker, PA-C Visit Date: 10/14/2022 Occupation: @GUAROCC @  Subjective:  Routine follow up   History of Present Illness: Tiffany Glover is a 62 y.o. female with history of sjogren's syndrome.  She is not currently taking immunosuppressive agents.  Patient reports that she has some mouth dryness at night which she attributes to using his CPAP.  She denies any mouth dryness throughout the day or any eye dryness.  She continues to see the dentist every 6 months and sees ophthalmology on yearly basis.  Patient reports that she saw Dr. Craige Cotta on 06/03/2022 and does not have to follow back up for a year.  She has not been experiencing any new or worsening pulmonary symptoms.  She denies any swollen lymph nodes.  She denies any parotid swelling or tenderness. She denies any increased joint pain or joint swelling.    Activities of Daily Living:  Patient reports morning stiffness for 0 minutes.   Patient Denies nocturnal pain.  Difficulty dressing/grooming: Denies Difficulty climbing stairs: Denies Difficulty getting out of chair: Denies Difficulty using hands for taps, buttons, cutlery, and/or writing: Denies  Review of Systems  Constitutional:  Negative for fatigue.  HENT:  Positive for mouth dryness. Negative for mouth sores.   Eyes:  Negative for dryness.  Respiratory:  Negative for shortness of breath.   Cardiovascular:  Negative for chest pain and palpitations.  Gastrointestinal:  Negative for blood in stool, constipation and diarrhea.  Endocrine: Negative for increased urination.  Genitourinary:  Negative for involuntary urination.  Musculoskeletal:  Negative for joint pain, gait problem, joint pain, joint swelling, myalgias, muscle weakness, morning stiffness, muscle tenderness and myalgias.  Skin:  Negative  for color change, rash, hair loss and sensitivity to sunlight.  Allergic/Immunologic: Negative for susceptible to infections.  Neurological:  Negative for dizziness and headaches.  Hematological:  Negative for swollen glands.  Psychiatric/Behavioral:  Negative for depressed mood and sleep disturbance. The patient is not nervous/anxious.     PMFS History:  Patient Active Problem List   Diagnosis Date Noted   Sjogren syndrome, unspecified 08/15/2022   Physical deconditioning 09/11/2019   Abnormal finding on radiology exam- possible emphasema findings on CT abd/pelvis by urology 09/11/2019   Kidney stone 09/08/2019   Asymptomatic microscopic hematuria 06/03/2018   History of nephrolithiasis- not requiring surgery 06/03/2018   Prediabetes 06/03/2018   Chronic right-sided thoracic back pain-muscle spasms medial to right scapula and inferior to right scapula 03/18/2018   Night muscle spasms-bilateral calves and hamstrings. 03/18/2018   Glucose intolerance (impaired glucose tolerance) 03/18/2018   Urine discoloration 03/18/2018   Dehydration, mild 03/18/2018   Chronic bilateral thoracic back pain 09/26/2017   Myalgia 09/26/2017   Plantar fascial fibromatosis of right foot 11/10/2016   Muscle cramps in legs b/l 11/10/2016   Salt craving 06/11/2016   New Onset- Essential hypertension 06/01/2016   Bilateral impacted cerumen 06/01/2016   Person with feared health complaint in whom no diagnosis is made 06/01/2016   Seborrheic dermatitis- ears, scalp 06/01/2016   Counseling on health promotion and disease prevention 06/01/2016   Vitamin D insufficiency 03/26/2016   History of laparoscopic cholecystectomy 02/25/2016   History of smoking 30 pack years-  Quit 9/ 1/ 96 02/25/2016   Family history of suicide- father age 58 02/25/2016   Family history of alcoholism in father; grandfathers etc 02/25/2016   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   Personal history of noncompliance with medical  treatment and regimen 02/25/2016   Elevated blood pressure reading without diagnosis of hypertension 02/25/2016   Incisional hernia 09/15/2014   Incisional hernia, without obstruction or gangrene 08/08/2014   Allergic rhinitis/ seasonal allergies 05/17/2014   DUB (dysfunctional uterine bleeding) 05/17/2014   HLD (hyperlipidemia) 05/17/2014   Elevated fasting blood sugar 05/17/2014   Class 2 obesity in adult 05/17/2014   IFG (impaired fasting glucose) 05/17/2014   Menopausal symptoms 05/17/2014   Shoulder joint pain 05/17/2014   Morbid obesity 09/14/2013   h/o Hypothyroidism 09/04/2007   h/o Hemorrhoids, internal, with bleeding 09/04/2007   h/o Hemorrhoids, external 09/04/2007   History of diverticulosis 09/04/2007    Past Medical History:  Diagnosis Date   Complication of anesthesia    hard to wake up once   Diverticulosis of colon    Eczema    Frequency of urination    History of hypothyroidism    10-06-2019  per pt no medication needed in several yrs, lab work normal   History of kidney stones    Hypertension    10-06-2019 per pt her pcp stopped medication 12/ 2019 (note in epic)  due to pt lost weight and bp improved;  currently per pt she is monitoring bp at home for pcp   Mixed hyperlipidemia    Pre-diabetes    followed by pcp, watching diet   Renal calculus, right    Sjogren syndrome, unspecified    Sleep apnea    Urgency of urination    Varicose vein of leg    Vitamin D deficiency     Family History  Problem Relation Age of Onset   Cancer Mother        Lung   Suicidality Father    Alcohol abuse Father    Cancer Brother        mouth   Cancer Maternal Grandmother    Breast cancer Maternal Grandmother    Alcohol abuse Maternal Grandfather    Urolithiasis Son    Hyperlipidemia Paternal Grandmother    Hypertension Paternal Grandmother    Alcohol abuse Paternal Grandfather    Colon cancer Neg Hx    Colon polyps Neg Hx    Esophageal cancer Neg Hx    Rectal  cancer Neg Hx    Stomach cancer Neg Hx    Past Surgical History:  Procedure Laterality Date   CESAREAN SECTION  x2  last one 1994   CHOLECYSTECTOMY  07/21/2012   Procedure: LAPAROSCOPIC CHOLECYSTECTOMY WITH INTRAOPERATIVE CHOLANGIOGRAM;  Surgeon: Emelia Loron, MD;  Location: [facility];  Service: General;  Laterality: N/A;  attempted   CHOLECYSTECTOMY  07/21/2012   Procedure: CHOLECYSTECTOMY;  Surgeon: Emelia Loron, MD;  Location: [facility];  Service: General;  Laterality: N/A;   COLONOSCOPY  last one 03-21-2017   CYSTOSCOPY/URETEROSCOPY/HOLMIUM LASER/STENT PLACEMENT Right 10/13/2019   Procedure: CYSTOSCOPY RIGHT RETROGRADE PYELOGRAM URETEROSCOPY/HOLMIUM LASER/STENT PLACEMENT;  Surgeon: Crista Elliot, MD;  Location: [facility] [place];  Service: Urology;  Laterality: Right;   EXTRACORPOREAL SHOCK WAVE LITHOTRIPSY Right 11/02/2018   Procedure: EXTRACORPOREAL SHOCK WAVE LITHOTRIPSY (ESWL);  Surgeon: Crista Elliot, MD;  Location: [facility];  Service: Urology;  Laterality: Right;   FOOT SURGERY  1990s   ganglion cyst removed from right foot  INCISIONAL HERNIA REPAIR N/A 09/15/2014   Procedure: LAPAROSCOPIC INCISIONAL HERNIA REPAIR WITH MESH;  Surgeon: Emelia Loron, MD;  Location: [facility];  Service: General;  Laterality: N/A;   vein strippiing  1990s   left leg   Social History   Social History Narrative   Not on file   Immunization History  Administered Date(s) Administered   PPD Test 12/09/2011     Objective: Vital Signs: BP (!) 158/81 (BP Location: Left Arm, Patient Position: Sitting, Cuff Size: Large)   Pulse 67   Resp 17   Ht 5\' 4"  (1.626 m)   Wt 257 lb (116.6 kg)   LMP 08/13/2014   BMI 44.11 kg/m    Physical Exam Vitals and nursing note reviewed.  Constitutional:      Appearance: She is well-developed.  HENT:     Head: Normocephalic and atraumatic.     Mouth/Throat:     Comments: No parotid swelling or tenderness. Eyes:     Conjunctiva/sclera:  Conjunctivae normal.  Cardiovascular:     Rate and Rhythm: Normal rate and regular rhythm.     Heart sounds: Normal heart sounds.  Pulmonary:     Effort: Pulmonary effort is normal.     Breath sounds: Normal breath sounds.  Abdominal:     General: Bowel sounds are normal.     Palpations: Abdomen is soft.  Musculoskeletal:     Cervical back: Normal range of motion.  Lymphadenopathy:     Cervical: No cervical adenopathy.  Skin:    General: Skin is warm and dry.     Capillary Refill: Capillary refill takes less than 2 seconds.  Neurological:     Mental Status: She is alert and oriented to person, place, and time.  Psychiatric:        Behavior: Behavior normal.      Musculoskeletal Exam: C-spine, thoracic spine, lumbar spine have good range of motion.  Shoulder joints, elbow joints, wrist joints, MCPs, PIPs, DIPs have good range of motion with no synovitis.  Complete fist formation bilaterally.  Hip joints have good range of motion with no groin pain.  Knee joints have good range of motion with no warmth or effusion.  Ankle joints have good range of motion with no tenderness or joint swelling.  CDAI Exam: CDAI Score: -- Patient Global: --; Provider Global: -- Swollen: --; Tender: -- Joint Exam 10/14/2022   No joint exam has been documented for this visit   There is currently no information documented on the homunculus. Go to the Rheumatology activity and complete the homunculus joint exam.  Investigation: No additional findings.  Imaging: No results found.  Recent Labs: Lab Results  Component Value Date   WBC 3.5 (L) 02/20/2022   HGB 13.6 02/20/2022   PLT 190 02/20/2022   NA 140 02/20/2022   K 4.4 02/20/2022   CL 103 02/20/2022   CO2 26 02/20/2022   GLUCOSE 102 (H) 02/20/2022   BUN 14 02/20/2022   CREATININE 0.63 02/20/2022   BILITOT 0.5 02/20/2022   ALKPHOS 78 01/17/2021   AST 18 02/20/2022   ALT 20 02/20/2022   PROT 7.6 02/20/2022   ALBUMIN 4.2 01/17/2021    CALCIUM 9.4 02/20/2022   GFRAA 115 12/10/2019   QFTBGOLDPLUS NEGATIVE 04/18/2020    Speciality Comments: No specialty comments available.  Procedures:  No procedures performed Allergies: Patient has no known allergies.   Assessment / Plan:     Visit Diagnoses: Sjogren's syndrome with other organ involvement - Positive ANA, positive  Ro, dry mouth, dry eyes and dry skin: She has been experiencing symptoms of dry mouth at night which she attributes to using a CPAP.  Discussed the use of XyliMelts and Biotene products for symptomatic relief.  She has no parotid swelling or tenderness on examination today.  No cervical lymphadenopathy was noted.  She has been seeing the dentist every 6 months as encouraged.  She has not been experiencing any eye dryness.  Encouraged the patient to see ophthalmology on a yearly basis.  She can use refresh or Systane eyedrops if needed.  She does not require immunosuppressive therapy at this time.  She had no signs or symptoms of inflammatory arthritis.  No synovitis was noted.  She has not had any new or worsening pulmonary symptoms.  She was evaluated by Dr. Craige Cotta on 06/03/22.   Discussed the 4-14 fold increased risk for developing lymphoma in patients with Sjogren's disease.  SPEP will be checked today along with complements. Lab work from 02/20/22: ANA negative, Ro antibody positive, complements WNL, and ESR WNL.  Following lab work will be obtained today for further evaluation.  She does not require immunosuppressive therapy at this time.  She was advised to notify us if she develops any new or worsening symptoms.  She will follow-up in the office in 6 months or sooner if needed.  - Plan: CBC with Differential/Platelet, COMPLETE METABOLIC PANEL WITH GFR, Urinalysis, Routine w reflex microscopic, ANA, C3 and C4, Sedimentation rate, Rheumatoid factor, Serum protein electrophoresis with reflex, Sjogrens syndrome-B extractable nuclear antibody, Sjogrens syndrome-A extractable  nuclear antibody  Idiopathic lymphocytic interstitial pneumonitis - Evaluated by Dr. Craige Cotta.  She had high-resolution CT which was stable on September 18, 2020. Reviewed Dr. Evlyn Courier office visit note from 06/03/2022-CT stable and no new respiratory symptoms.  She plans on following up on a yearly basis.    Chronic right-sided thoracic back pain-Resolved.  Other medical conditions are listed as follows:  Mixed hyperlipidemia  History of diverticulosis  Essential hypertension: Blood pressure was 158/81 today in the office.  Her blood pressure was rechecked prior to leaving.  She was advised to monitor her blood pressure closely and if it remains elevated to follow-up with her PCP for further evaluation and treatment.  Prediabetes  History of hypothyroidism  History of nephrolithiasis  Vitamin D insufficiency  Other sleep apnea  Dyshidrotic hand dermatitis  Seborrheic dermatitis- ears, scalp  History of smoking 30 pack years- Quit 9/ 1/ 96  History of laparoscopic cholecystectomy  Family history of suicide- father age 77  Family history of alcoholism in father; grandfathers etc  Orders: Orders Placed This Encounter  Procedures   CBC with Differential/Platelet   COMPLETE METABOLIC PANEL WITH GFR   Urinalysis, Routine w reflex microscopic   ANA   C3 and C4   Sedimentation rate   Rheumatoid factor   Serum protein electrophoresis with reflex   Sjogrens syndrome-B extractable nuclear antibody   Sjogrens syndrome-A extractable nuclear antibody   No orders of the defined types were placed in this encounter.    Follow-Up Instructions: Return in about 6 months (around 04/15/2023) for Sjogren's syndrome.   Gearldine Bienenstock, PA-C  Note - This record has been created using Dragon software.  Chart creation errors have been sought, but may not always  have been located. Such creation errors do not reflect on  the standard of medical care.

## 2022-10-14 ENCOUNTER — Encounter: Payer: Self-pay | Admitting: Physician Assistant

## 2022-10-14 ENCOUNTER — Ambulatory Visit: Payer: BC Managed Care – PPO | Attending: Physician Assistant | Admitting: Physician Assistant

## 2022-10-14 VITALS — BP 143/77 | HR 62 | Resp 17 | Ht 64.0 in | Wt 257.0 lb

## 2022-10-14 DIAGNOSIS — E782 Mixed hyperlipidemia: Secondary | ICD-10-CM | POA: Diagnosis not present

## 2022-10-14 DIAGNOSIS — I10 Essential (primary) hypertension: Secondary | ICD-10-CM

## 2022-10-14 DIAGNOSIS — M546 Pain in thoracic spine: Secondary | ICD-10-CM

## 2022-10-14 DIAGNOSIS — R7303 Prediabetes: Secondary | ICD-10-CM

## 2022-10-14 DIAGNOSIS — E559 Vitamin D deficiency, unspecified: Secondary | ICD-10-CM

## 2022-10-14 DIAGNOSIS — M3509 Sicca syndrome with other organ involvement: Secondary | ICD-10-CM

## 2022-10-14 DIAGNOSIS — L301 Dyshidrosis [pompholyx]: Secondary | ICD-10-CM

## 2022-10-14 DIAGNOSIS — Z87891 Personal history of nicotine dependence: Secondary | ICD-10-CM

## 2022-10-14 DIAGNOSIS — G8929 Other chronic pain: Secondary | ICD-10-CM

## 2022-10-14 DIAGNOSIS — J842 Lymphoid interstitial pneumonia: Secondary | ICD-10-CM

## 2022-10-14 DIAGNOSIS — Z87442 Personal history of urinary calculi: Secondary | ICD-10-CM

## 2022-10-14 DIAGNOSIS — Z818 Family history of other mental and behavioral disorders: Secondary | ICD-10-CM

## 2022-10-14 DIAGNOSIS — Z8719 Personal history of other diseases of the digestive system: Secondary | ICD-10-CM

## 2022-10-14 DIAGNOSIS — Z9049 Acquired absence of other specified parts of digestive tract: Secondary | ICD-10-CM

## 2022-10-14 DIAGNOSIS — L219 Seborrheic dermatitis, unspecified: Secondary | ICD-10-CM

## 2022-10-14 DIAGNOSIS — Z8639 Personal history of other endocrine, nutritional and metabolic disease: Secondary | ICD-10-CM

## 2022-10-14 DIAGNOSIS — G4739 Other sleep apnea: Secondary | ICD-10-CM

## 2022-10-14 DIAGNOSIS — Z811 Family history of alcohol abuse and dependence: Secondary | ICD-10-CM

## 2022-10-15 LAB — URINALYSIS, ROUTINE W REFLEX MICROSCOPIC
Leukocytes,Ua: NEGATIVE
Specific Gravity, Urine: 1.006 (ref 1.001–1.035)
pH: 5.5 (ref 5.0–8.0)

## 2022-10-15 LAB — RHEUMATOID FACTOR: Rheumatoid fact SerPl-aCnc: <10 IU/mL (ref <14)

## 2022-10-15 LAB — COMPLETE METABOLIC PANEL WITH GFR
BUN: 10 mg/dL (ref 7–25)
Glucose, Bld: 94 mg/dL (ref 65–139)
Total Bilirubin: 0.5 mg/dL (ref 0.2–1.2)
eGFR: 101 mL/min/{1.73_m2} (ref >=60)

## 2022-10-15 LAB — SJOGRENS SYNDROME-A EXTRACTABLE NUCLEAR ANTIBODY: SSA (Ro) (ENA) Antibody, IgG: >8 AI — AB

## 2022-10-15 NOTE — Progress Notes (Signed)
CBC and CMP WNL ES WNL  UA normal

## 2022-10-15 NOTE — Progress Notes (Signed)
RF negative  Complements WNL

## 2022-10-16 LAB — URINALYSIS, ROUTINE W REFLEX MICROSCOPIC
Bilirubin Urine: NEGATIVE
Glucose, UA: NEGATIVE
Hgb urine dipstick: NEGATIVE
Ketones, ur: NEGATIVE

## 2022-10-16 LAB — COMPLETE METABOLIC PANEL WITH GFR
AG Ratio: 1.4 (calc) (ref 1.0–2.5)
ALT: 20 U/L (ref 6–29)
Albumin: 4.5 g/dL (ref 3.6–5.1)
Globulin: 3.2 g/dL (calc) (ref 1.9–3.7)
Potassium: 4.2 mmol/L (ref 3.5–5.3)

## 2022-10-16 LAB — PROTEIN ELECTROPHORESIS, SERUM, WITH REFLEX: Total Protein: 7.5 g/dL (ref 6.1–8.1)

## 2022-10-16 NOTE — Progress Notes (Signed)
ANA is positive.  SPEP pending

## 2022-10-16 NOTE — Progress Notes (Signed)
Ro and La antibodies remain positive.

## 2022-10-17 LAB — SJOGRENS SYNDROME-B EXTRACTABLE NUCLEAR ANTIBODY: SSB (La) (ENA) Antibody, IgG: 1.1 AI — AB

## 2022-10-17 LAB — COMPLETE METABOLIC PANEL WITH GFR
AST: 18 U/L (ref 10–35)
Alkaline phosphatase (APISO): 75 U/L (ref 37–153)
CO2: 22 mmol/L (ref 20–32)
Calcium: 9.3 mg/dL (ref 8.6–10.4)
Chloride: 103 mmol/L (ref 98–110)
Creat: 0.62 mg/dL (ref 0.50–1.05)
Sodium: 141 mmol/L (ref 135–146)
Total Protein: 7.7 g/dL (ref 6.1–8.1)

## 2022-10-17 LAB — CBC WITH DIFFERENTIAL/PLATELET
Absolute Monocytes: 368 cells/uL (ref 200–950)
Basophils Absolute: 28 cells/uL (ref 0–200)
Basophils Relative: 0.7 %
Eosinophils Absolute: 108 cells/uL (ref 15–500)
Eosinophils Relative: 2.7 %
HCT: 39.9 % (ref 35.0–45.0)
Hemoglobin: 13.4 g/dL (ref 11.7–15.5)
Lymphs Abs: 1000 cells/uL (ref 850–3900)
MCH: 30.5 pg (ref 27.0–33.0)
MCHC: 33.6 g/dL (ref 32.0–36.0)
MCV: 90.9 fL (ref 80.0–100.0)
MPV: 12.5 fL (ref 7.5–12.5)
Monocytes Relative: 9.2 %
Neutro Abs: 2496 cells/uL (ref 1500–7800)
Neutrophils Relative %: 62.4 %
Platelets: 181 10*3/uL (ref 140–400)
RBC: 4.39 10*6/uL (ref 3.80–5.10)
RDW: 13.3 % (ref 11.0–15.0)
Total Lymphocyte: 25 %
WBC: 4 10*3/uL (ref 3.8–10.8)

## 2022-10-17 LAB — ANTI-NUCLEAR AB-TITER (ANA TITER): ANA Titer 1: 1:320 {titer} — ABNORMAL HIGH

## 2022-10-17 LAB — C3 AND C4
C3 Complement: 162 mg/dL (ref 83–193)
C4 Complement: 24 mg/dL (ref 15–57)

## 2022-10-17 LAB — PROTEIN ELECTROPHORESIS, SERUM, WITH REFLEX
Albumin ELP: 4.3 g/dL (ref 3.8–4.8)
Alpha 1: 0.3 g/dL (ref 0.2–0.3)
Alpha 2: 0.8 g/dL (ref 0.5–0.9)
Beta 2: 0.4 g/dL (ref 0.2–0.5)
Beta Globulin: 0.5 g/dL (ref 0.4–0.6)
Gamma Globulin: 1.2 g/dL (ref 0.8–1.7)

## 2022-10-17 LAB — URINALYSIS, ROUTINE W REFLEX MICROSCOPIC
Nitrite: NEGATIVE
Protein, ur: NEGATIVE

## 2022-10-17 LAB — ANA: Anti Nuclear Antibody (ANA): POSITIVE — AB

## 2022-10-17 LAB — SEDIMENTATION RATE: Sed Rate: 22 mm/h (ref 0–30)

## 2022-10-17 NOTE — Progress Notes (Signed)
SPEP did not reveal any abnormal protein bands.

## 2023-04-15 NOTE — Progress Notes (Signed)
Office Visit Note  Patient: Tiffany Glover             Date of Birth: [birth year]           MRN: [id number]             PCP: Mayer Masker, PA-C (Inactive) Referring: No ref. provider found Visit Date: 04/28/2023 Occupation: @GUAROCC @  Subjective:  Dry mouth and dry eyes  History of Present Illness: Tiffany Glover is a 62 y.o. female with Sjogren syndrome.  Patient states that her dry mouth and dry eye symptoms are manageable or not to been noticeable now.  She does not take pilocarpine.  She goes to the dentist on a regular basis and has not noticed any issues.  She has not seen Dr. Craige Cotta recently but denies any increased shortness of breath.  She denies any history of palpitations, parotid swelling or lymphadenopathy.  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  She denies any radiculopathy.  Patient states she had x-rays of her thoracic spine which showed some degenerative changes.    Activities of Daily Living:  Patient reports morning stiffness for 0  minute.   Patient Denies nocturnal pain.  Difficulty dressing/grooming: Denies Difficulty climbing stairs: Denies Difficulty getting out of chair: Denies Difficulty using hands for taps, buttons, cutlery, and/or writing: Denies  Review of Systems  Constitutional:  Negative for fatigue.  HENT:  Negative for mouth dryness.   Eyes:  Negative for dryness.  Respiratory:  Negative for shortness of breath.   Cardiovascular:  Negative for palpitations.  Gastrointestinal:  Negative for constipation and diarrhea.  Endocrine: Negative for increased urination.  Genitourinary:  Negative for difficulty urinating.  Musculoskeletal:  Negative for joint pain, joint pain, myalgias, morning stiffness and myalgias.  Skin:  Negative for color change, rash and sensitivity to sunlight.  Allergic/Immunologic: Negative for susceptible to infections.  Neurological:  Negative for headaches.   Hematological:  Negative for swollen glands.  Psychiatric/Behavioral:  Negative for depressed mood and sleep disturbance. The patient is not nervous/anxious.     PMFS History:  Patient Active Problem List   Diagnosis Date Noted   Sjogren syndrome, unspecified (HCC) 08/15/2022   Physical deconditioning 09/11/2019   Abnormal finding on radiology exam- possible emphasema findings on CT abd/pelvis by urology 09/11/2019   Kidney stone 09/08/2019   Asymptomatic microscopic hematuria 06/03/2018   History of nephrolithiasis- not requiring surgery 06/03/2018   Prediabetes 06/03/2018   Chronic right-sided thoracic back pain-muscle spasms medial to right scapula and inferior to right scapula 03/18/2018   Night muscle spasms-bilateral calves and hamstrings. 03/18/2018   Glucose intolerance (impaired glucose tolerance) 03/18/2018   Urine discoloration 03/18/2018   Dehydration, mild 03/18/2018   Chronic bilateral thoracic back pain 09/26/2017   Myalgia 09/26/2017   Plantar fascial fibromatosis of right foot 11/10/2016   Muscle cramps in legs b/l 11/10/2016   Salt craving 06/11/2016   New Onset- Essential hypertension 06/01/2016   Bilateral impacted cerumen 06/01/2016   Person with feared health complaint in whom no diagnosis is made 06/01/2016   Seborrheic dermatitis- ears, scalp 06/01/2016   Counseling on health promotion and disease prevention 06/01/2016   Vitamin D insufficiency 03/26/2016   History of laparoscopic cholecystectomy 02/25/2016   History of smoking 30 pack years- Quit 9/ 1/ 96 02/25/2016   Family history of suicide- father age 28 02/25/2016   Family history of alcoholism in father; grandfathers etc 02/25/2016   Dyshidrotic  hand dermatitis 02/25/2016   Personal history of noncompliance with medical treatment and regimen 02/25/2016   Elevated blood pressure reading without diagnosis of hypertension 02/25/2016   Incisional hernia 09/15/2014   Incisional hernia, without  obstruction or gangrene 08/08/2014   Allergic rhinitis/ seasonal allergies 05/17/2014   DUB (dysfunctional uterine bleeding) 05/17/2014   HLD (hyperlipidemia) 05/17/2014   Elevated fasting blood sugar 05/17/2014   Class 2 obesity in adult 05/17/2014   IFG (impaired fasting glucose) 05/17/2014   Menopausal symptoms 05/17/2014   Shoulder joint pain 05/17/2014   Morbid obesity (HCC) 09/14/2013   h/o Hypothyroidism 09/04/2007   h/o Hemorrhoids, internal, with bleeding 09/04/2007   h/o Hemorrhoids, external 09/04/2007   History of diverticulosis 09/04/2007    Past Medical History:  Diagnosis Date   Complication of anesthesia    hard to wake up once   Diverticulosis of colon    Eczema    Frequency of urination    History of hypothyroidism    10-06-2019  per pt no medication needed in several yrs, lab work normal   History of kidney stones    Hypertension    10-06-2019 per pt her pcp stopped medication 12/ 2019 (note in epic)  due to pt lost weight and bp improved;  currently per pt she is monitoring bp at home for pcp   Mixed hyperlipidemia    Pre-diabetes    followed by pcp, watching diet   Renal calculus, right    Sjogren syndrome, unspecified (HCC)    Sleep apnea    Urgency of urination    Varicose vein of leg    Vitamin D deficiency     Family History  Problem Relation Age of Onset   Cancer Mother        Lung   Suicidality Father    Alcohol abuse Father    Cancer Brother        mouth   Cancer Maternal Grandmother    Breast cancer Maternal Grandmother    Alcohol abuse Maternal Grandfather    Hyperlipidemia Paternal Grandmother    Hypertension Paternal Grandmother    Alcohol abuse Paternal Grandfather    Urolithiasis Son    Colon cancer Neg Hx    Colon polyps Neg Hx    Esophageal cancer Neg Hx    Rectal cancer Neg Hx    Stomach cancer Neg Hx    Past Surgical History:  Procedure Laterality Date   CESAREAN SECTION  x2  last one 1994   CHOLECYSTECTOMY  07/21/2012    Procedure: LAPAROSCOPIC CHOLECYSTECTOMY WITH INTRAOPERATIVE CHOLANGIOGRAM;  Surgeon: Emelia Loron, MD;  Location: [facility];  Service: General;  Laterality: N/A;  attempted   CHOLECYSTECTOMY  07/21/2012   Procedure: CHOLECYSTECTOMY;  Surgeon: Emelia Loron, MD;  Location: [facility];  Service: General;  Laterality: N/A;   COLONOSCOPY  last one 03-21-2017   CYSTOSCOPY/URETEROSCOPY/HOLMIUM LASER/STENT PLACEMENT Right 10/13/2019   Procedure: CYSTOSCOPY RIGHT RETROGRADE PYELOGRAM URETEROSCOPY/HOLMIUM LASER/STENT PLACEMENT;  Surgeon: Crista Elliot, MD;  Location: [facility];  Service: Urology;  Laterality: Right;   EXTRACORPOREAL SHOCK WAVE LITHOTRIPSY Right 11/02/2018   Procedure: EXTRACORPOREAL SHOCK WAVE LITHOTRIPSY (ESWL);  Surgeon: Crista Elliot, MD;  Location: [facility];  Service: Urology;  Laterality: Right;   FOOT SURGERY  1990s   ganglion cyst removed from right foot   INCISIONAL HERNIA REPAIR N/A 09/15/2014   Procedure: LAPAROSCOPIC INCISIONAL HERNIA REPAIR WITH MESH;  Surgeon: Emelia Loron, MD;  Location: [facility];  Service: General;  Laterality: N/A;   vein strippiing  1990s   left leg   Social History   Social History Narrative   Not on file   Immunization History  Administered Date(s) Administered   PPD Test 12/09/2011     Objective: Vital Signs: BP (!) 140/76 (BP Location: Left Arm, Patient Position: Sitting, Cuff Size: Large)   Pulse 64   Resp 16   Ht 5\' 4"  (1.626 m)   Wt 246 lb (111.6 kg)   LMP 08/13/2014   BMI 42.23 kg/m    Physical Exam Vitals and nursing note reviewed.  Constitutional:      Appearance: She is well-developed.  HENT:     Head: Normocephalic and atraumatic.  Eyes:     Conjunctiva/sclera: Conjunctivae normal.  Cardiovascular:     Rate and Rhythm: Normal rate and regular rhythm.     Heart sounds: Normal heart sounds.  Pulmonary:     Effort: Pulmonary effort is normal.     Breath sounds: Normal breath sounds.   Abdominal:     General: Bowel sounds are normal.     Palpations: Abdomen is soft.  Musculoskeletal:     Cervical back: Normal range of motion.  Lymphadenopathy:     Cervical: No cervical adenopathy.  Skin:    General: Skin is warm and dry.     Capillary Refill: Capillary refill takes less than 2 seconds.  Neurological:     Mental Status: She is alert and oriented to person, place, and time.  Psychiatric:        Behavior: Behavior normal.      Musculoskeletal Exam: Cervical, thoracic and lumbar spine 1 good range of motion.  Shoulder MCPs PIPs and DIPs been good range of motion without any.  Hip joints, knee joints, ankles, MTPs and PIPs were in good range of motion with no synovitis.  CDAI Exam: CDAI Score: -- Patient Global: --; Provider Global: -- Swollen: --; Tender: -- Joint Exam 04/28/2023   No joint exam has been documented for this visit   There is currently no information documented on the homunculus. Go to the Rheumatology activity and complete the homunculus joint exam.  Investigation: No additional findings.  Imaging: No results found.  Recent Labs: Lab Results  Component Value Date   WBC 4.0 10/14/2022   HGB 13.4 10/14/2022   PLT 181 10/14/2022   NA 141 10/14/2022   K 4.2 10/14/2022   CL 103 10/14/2022   CO2 22 10/14/2022   GLUCOSE 94 10/14/2022   BUN 10 10/14/2022   CREATININE 0.62 10/14/2022   BILITOT 0.5 10/14/2022   ALKPHOS 78 01/17/2021   AST 18 10/14/2022   ALT 20 10/14/2022   PROT 7.7 10/14/2022   PROT 7.5 10/14/2022   ALBUMIN 4.2 01/17/2021   CALCIUM 9.3 10/14/2022   GFRAA 115 12/10/2019   QFTBGOLDPLUS NEGATIVE 04/18/2020    Speciality Comments: No specialty comments available.  Procedures:  No procedures performed Allergies: Patient has no known allergies.   Assessment / Plan:     Visit Diagnoses: Sjogren's syndrome with other organ involvement (HCC) - Positive ANA, positive Ro, dry mouth, dry eyes and dry skin: -Patient states  her dry mouth and dry eye symptoms are manageable.  She had recent visit with the dentist which went well.  She denies any cavities.  There is no history of lymphadenopathy, parotid swelling, shortness of breath or palpitations.  Her lungs were clear to auscultation.  Increased association of ILD and lymphoma with Sjogren's was discussed.  Patient is currently asymptomatic.  She has had positive ANA and positive SSA antibody.  I would skip labs this visit.  Will check labs prior to her next visit and discuss at the follow-up visit.  Plan: Urinalysis, Routine w reflex microscopic, COMPLETE METABOLIC PANEL WITH GFR, CBC with Differential/Platelet, ANA, Anti-DNA antibody, double-stranded, C3 and C4, Sedimentation rate, Sjogrens syndrome-A extractable nuclear antibody, Serum protein electrophoresis with reflex  Idiopathic lymphocytic interstitial pneumonitis (HCC) - Evaluated by Dr. Craige Cotta.  Patient states that she will schedule follow-up visit with Dr. Craige Cotta.  Chronic right-sided thoracic back pain-patient states that she had x-rays of her thoracic spine which showed some degenerative changes.  She had no point tenderness on the examination.  She has some discomfort in the thoracic region when she is doing massage therapy.  Some back exercises were demonstrated and a handout was given.  Mixed hyperlipidemia-patient is trying to exercise on a regular basis and also has been watching her diet.  Information regarding the DASH diet was provided.  History of diverticulosis-asymptomatic.  Essential hypertension  Prediabetes  History of nephrolithiasis- not requiring surgery  History of hypothyroidism  Vitamin D insufficiency  Other sleep apnea  Dyshidrotic hand dermatitis  Seborrheic dermatitis- ears, scalp  History of smoking 30 pack years- Quit 9/ 1/ 96  History of laparoscopic cholecystectomy  Family history of suicide- father age 39  Family history of alcoholism in father; grandfathers  etc  Orders: Orders Placed This Encounter  Procedures   CBC with Differential/Platelet   COMPLETE METABOLIC PANEL WITH GFR   Urinalysis, Routine w reflex microscopic   ANA   Anti-DNA antibody, double-stranded   C3 and C4   Sedimentation rate   Serum protein electrophoresis with reflex   Sjogrens syndrome-A extractable nuclear antibody   No orders of the defined types were placed in this encounter.    Follow-Up Instructions: Return in about 6 months (around 10/27/2023) for Sjogren's.   Pollyann Savoy, MD  Note - This record has been created using Animal nutritionist.  Chart creation errors have been sought, but may not always  have been located. Such creation errors do not reflect on  the standard of medical care.

## 2023-04-28 ENCOUNTER — Ambulatory Visit: Payer: BC Managed Care – PPO | Attending: Rheumatology | Admitting: Rheumatology

## 2023-04-28 ENCOUNTER — Encounter: Payer: Self-pay | Admitting: Rheumatology

## 2023-04-28 VITALS — BP 140/76 | HR 64 | Resp 16 | Ht 64.0 in | Wt 246.0 lb

## 2023-04-28 DIAGNOSIS — Z8639 Personal history of other endocrine, nutritional and metabolic disease: Secondary | ICD-10-CM

## 2023-04-28 DIAGNOSIS — M546 Pain in thoracic spine: Secondary | ICD-10-CM | POA: Diagnosis not present

## 2023-04-28 DIAGNOSIS — M3509 Sicca syndrome with other organ involvement: Secondary | ICD-10-CM

## 2023-04-28 DIAGNOSIS — L219 Seborrheic dermatitis, unspecified: Secondary | ICD-10-CM

## 2023-04-28 DIAGNOSIS — Z9049 Acquired absence of other specified parts of digestive tract: Secondary | ICD-10-CM

## 2023-04-28 DIAGNOSIS — J842 Lymphoid interstitial pneumonia: Secondary | ICD-10-CM

## 2023-04-28 DIAGNOSIS — G8929 Other chronic pain: Secondary | ICD-10-CM

## 2023-04-28 DIAGNOSIS — Z818 Family history of other mental and behavioral disorders: Secondary | ICD-10-CM

## 2023-04-28 DIAGNOSIS — G4739 Other sleep apnea: Secondary | ICD-10-CM

## 2023-04-28 DIAGNOSIS — Z8719 Personal history of other diseases of the digestive system: Secondary | ICD-10-CM

## 2023-04-28 DIAGNOSIS — E559 Vitamin D deficiency, unspecified: Secondary | ICD-10-CM

## 2023-04-28 DIAGNOSIS — R7303 Prediabetes: Secondary | ICD-10-CM

## 2023-04-28 DIAGNOSIS — L301 Dyshidrosis [pompholyx]: Secondary | ICD-10-CM

## 2023-04-28 DIAGNOSIS — Z811 Family history of alcohol abuse and dependence: Secondary | ICD-10-CM

## 2023-04-28 DIAGNOSIS — E782 Mixed hyperlipidemia: Secondary | ICD-10-CM

## 2023-04-28 DIAGNOSIS — Z87891 Personal history of nicotine dependence: Secondary | ICD-10-CM

## 2023-04-28 DIAGNOSIS — I10 Essential (primary) hypertension: Secondary | ICD-10-CM

## 2023-04-28 DIAGNOSIS — Z87442 Personal history of urinary calculi: Secondary | ICD-10-CM

## 2023-04-28 NOTE — Patient Instructions (Addendum)
Low Back Sprain or Strain Rehab Ask your health care provider which exercises are safe for you. Do exercises exactly as told by your health care provider and adjust them as directed. It is normal to feel mild stretching, pulling, tightness, or discomfort as you do these exercises. Stop right away if you feel sudden pain or your pain gets worse. Do not begin these exercises until told by your health care provider. Stretching and range-of-motion exercises These exercises warm up your muscles and joints and improve the movement and flexibility of your back. These exercises also help to relieve pain, numbness, and tingling. Lumbar rotation  Lie on your back on a firm bed or the floor with your knees bent. Straighten your arms out to your sides so each arm forms a 90-degree angle (right angle) with a side of your body. Slowly move (rotate) both of your knees to one side of your body until you feel a stretch in your lower back (lumbar). Try not to let your shoulders lift off the floor. Hold this position for __________ seconds. Tense your abdominal muscles and slowly move your knees back to the starting position. Repeat this exercise on the other side of your body. Repeat __________ times. Complete this exercise __________ times a day. Single knee to chest  Lie on your back on a firm bed or the floor with both legs straight. Bend one of your knees. Use your hands to move your knee up toward your chest until you feel a gentle stretch in your lower back and buttock. Hold your leg in this position by holding on to the front of your knee. Keep your other leg as straight as possible. Hold this position for __________ seconds. Slowly return to the starting position. Repeat with your other leg. Repeat __________ times. Complete this exercise __________ times a day. Prone extension on elbows  Lie on your abdomen on a firm bed or the floor (prone position). Prop yourself up on your elbows. Use your arms  to help lift your chest up until you feel a gentle stretch in your abdomen and your lower back. This will place some of your body weight on your elbows. If this is uncomfortable, try stacking pillows under your chest. Your hips should stay down, against the surface that you are lying on. Keep your hip and back muscles relaxed. Hold this position for __________ seconds. Slowly relax your upper body and return to the starting position. Repeat __________ times. Complete this exercise __________ times a day. Strengthening exercises These exercises build strength and endurance in your back. Endurance is the ability to use your muscles for a long time, even after they get tired. Pelvic tilt This exercise strengthens the muscles that lie deep in the abdomen. Lie on your back on a firm bed or the floor with your legs extended. Bend your knees so they are pointing toward the ceiling and your feet are flat on the floor. Tighten your lower abdominal muscles to press your lower back against the floor. This motion will tilt your pelvis so your tailbone points up toward the ceiling instead of pointing to your feet or the floor. To help with this exercise, you may place a small towel under your lower back and try to push your back into the towel. Hold this position for __________ seconds. Let your muscles relax completely before you repeat this exercise. Repeat __________ times. Complete this exercise __________ times a day. Alternating arm and leg raises  Get on your hands  and knees on a firm surface. If you are on a hard floor, you may want to use padding, such as an exercise mat, to cushion your knees. Line up your arms and legs. Your hands should be directly below your shoulders, and your knees should be directly below your hips. Lift your left leg behind you. At the same time, raise your right arm and straighten it in front of you. Do not lift your leg higher than your hip. Do not lift your arm higher  than your shoulder. Keep your abdominal and back muscles tight. Keep your hips facing the ground. Do not arch your back. Keep your balance carefully, and do not hold your breath. Hold this position for __________ seconds. Slowly return to the starting position. Repeat with your right leg and your left arm. Repeat __________ times. Complete this exercise __________ times a day. Abdominal set with straight leg raise  Lie on your back on a firm bed or the floor. Bend one of your knees and keep your other leg straight. Tense your abdominal muscles and lift your straight leg up, 4-6 inches (10-15 cm) off the ground. Keep your abdominal muscles tight and hold this position for __________ seconds. Do not hold your breath. Do not arch your back. Keep it flat against the ground. Keep your abdominal muscles tense as you slowly lower your leg back to the starting position. Repeat with your other leg. Repeat __________ times. Complete this exercise __________ times a day. Single leg lower with bent knees Lie on your back on a firm bed or the floor. Tense your abdominal muscles and lift your feet off the floor, one foot at a time, so your knees and hips are bent in 90-degree angles (right angles). Your knees should be over your hips and your lower legs should be parallel to the floor. Keeping your abdominal muscles tense and your knee bent, slowly lower one of your legs so your toe touches the ground. Lift your leg back up to return to the starting position. Do not hold your breath. Do not let your back arch. Keep your back flat against the ground. Repeat with your other leg. Repeat __________ times. Complete this exercise __________ times a day. Posture and body mechanics Good posture and healthy body mechanics can help to relieve stress in your body's tissues and joints. Body mechanics refers to the movements and positions of your body while you do your daily activities. Posture is part of body  mechanics. Good posture means: Your spine is in its natural S-curve position (neutral). Your shoulders are pulled back slightly. Your head is not tipped forward (neutral). Follow these guidelines to improve your posture and body mechanics in your everyday activities. Standing  When standing, keep your spine neutral and your feet about hip-width apart. Keep a slight bend in your knees. Your ears, shoulders, and hips should line up. When you do a task in which you stand in one place for a long time, place one foot up on a stable object that is 2-4 inches (5-10 cm) high, such as a footstool. This helps keep your spine neutral. Sitting  When sitting, keep your spine neutral and keep your feet flat on the floor. Use a footrest, if necessary, and keep your thighs parallel to the floor. Avoid rounding your shoulders, and avoid tilting your head forward. When working at a desk or a computer, keep your desk at a height where your hands are slightly lower than your elbows. Slide your  chair under your desk so you are close enough to maintain good posture. When working at a computer, place your monitor at a height where you are looking straight ahead and you do not have to tilt your head forward or downward to look at the screen. Resting When lying down and resting, avoid positions that are most painful for you. If you have pain with activities such as sitting, bending, stooping, or squatting, lie in a position in which your body does not bend very much. For example, avoid curling up on your side with your arms and knees near your chest (fetal position). If you have pain with activities such as standing for a long time or reaching with your arms, lie with your spine in a neutral position and bend your knees slightly. Try the following positions: Lying on your side with a pillow between your knees. Lying on your back with a pillow under your knees. Lifting  When lifting objects, keep your feet at least  shoulder-width apart and tighten your abdominal muscles. Bend your knees and hips and keep your spine neutral. It is important to lift using the strength of your legs, not your back. Do not lock your knees straight out. Always ask for help to lift heavy or awkward objects. This information is not intended to replace advice given to you by your health care provider. Make sure you discuss any questions you have with your health care provider. Document Revised: 10/21/2022 Document Reviewed: 09/04/2020 Elsevier Patient Education  2024 [employer] Inc.    DASH Eating Plan DASH stands for Dietary Approaches to Stop Hypertension. The DASH eating plan is a healthy eating plan that has been shown to: Lower high blood pressure (hypertension). Reduce your risk for type 2 diabetes, heart disease, and stroke. Help with weight loss. What are tips for following this plan? Reading food labels Check food labels for the amount of salt (sodium) per serving. Choose foods with less than 5 percent of the Daily Value (DV) of sodium. In general, foods with less than 300 milligrams (mg) of sodium per serving fit into this eating plan. To find whole grains, look for the word "whole" as the first word in the ingredient list. Shopping Buy products labeled as "low-sodium" or "no salt added." Buy fresh foods. Avoid canned foods and pre-made or frozen meals. Cooking Try not to add salt when you cook. Use salt-free seasonings or herbs instead of table salt or sea salt. Check with your health care provider or pharmacist before using salt substitutes. Do not fry foods. Cook foods in healthy ways, such as baking, boiling, grilling, roasting, or broiling. Cook using oils that are good for your heart. These include olive, canola, avocado, soybean, and sunflower oil. Meal planning  Eat a balanced diet. This should include: 4 or more servings of fruits and 4 or more servings of vegetables each day. Try to fill half of your plate  with fruits and vegetables. 6-8 servings of whole grains each day. 6 or less servings of lean meat, poultry, or fish each day. 1 oz is 1 serving. A 3 oz (85 g) serving of meat is about the same size as the palm of your hand. One egg is 1 oz (28 g). 2-3 servings of low-fat dairy each day. One serving is 1 cup (237 mL). 1 serving of nuts, seeds, or beans 5 times each week. 2-3 servings of heart-healthy fats. Healthy fats called omega-3 fatty acids are found in foods such as walnuts, flaxseeds, fortified  milks, and eggs. These fats are also found in cold-water fish, such as sardines, salmon, and mackerel. Limit how much you eat of: Canned or prepackaged foods. Food that is high in trans fat, such as fried foods. Food that is high in saturated fat, such as fatty meat. Desserts and other sweets, sugary drinks, and other foods with added sugar. Full-fat dairy products. Do not salt foods before eating. Do not eat more than 4 egg yolks a week. Try to eat at least 2 vegetarian meals a week. Eat more home-cooked food and less restaurant, buffet, and fast food. Lifestyle When eating at a restaurant, ask if your food can be made with less salt or no salt. If you drink alcohol: Limit how much you have to: 0-1 drink a day if you are female. 0-2 drinks a day if you are female. Know how much alcohol is in your drink. In the [place] one drink is one 12 oz bottle of beer (355 mL), one 5 oz glass of wine (148 mL), or one 1 oz glass of hard liquor (44 mL). General information Avoid eating more than 2,300 mg of salt a day. If you have hypertension, you may need to reduce your sodium intake to 1,500 mg a day. Work with your provider to stay at a healthy body weight or lose weight. Ask what the best weight range is for you. On most days of the week, get at least 30 minutes of exercise that causes your heart to beat faster. This may include walking, swimming, or biking. Work with your provider or dietitian to adjust  your eating plan to meet your specific calorie needs. What foods should I eat? Fruits All fresh, dried, or frozen fruit. Canned fruits that are in their natural juice and do not have sugar added to them. Vegetables Fresh or frozen vegetables that are raw, steamed, roasted, or grilled. Low-sodium or reduced-sodium tomato and vegetable juice. Low-sodium or reduced-sodium tomato sauce and tomato paste. Low-sodium or reduced-sodium canned vegetables. Grains Whole-grain or whole-wheat bread. Whole-grain or whole-wheat pasta. Brown rice. Orpah Cobb. Bulgur. Whole-grain and low-sodium cereals. Pita bread. Low-fat, low-sodium crackers. Whole-wheat flour tortillas. Meats and other proteins Skinless chicken or [place]. Ground chicken or [place]. Pork with fat trimmed off. Fish and seafood. Egg whites. Dried beans, peas, or lentils. Unsalted nuts, nut butters, and seeds. Unsalted canned beans. Lean cuts of beef with fat trimmed off. Low-sodium, lean precooked or cured meat, such as sausages or meat loaves. Dairy Low-fat (1%) or fat-free (skim) milk. Reduced-fat, low-fat, or fat-free cheeses. Nonfat, low-sodium ricotta or cottage cheese. Low-fat or nonfat yogurt. Low-fat, low-sodium cheese. Fats and oils Soft margarine without trans fats. Vegetable oil. Reduced-fat, low-fat, or light mayonnaise and salad dressings (reduced-sodium). Canola, safflower, olive, avocado, soybean, and sunflower oils. Avocado. Seasonings and condiments Herbs. Spices. Seasoning mixes without salt. Other foods Unsalted popcorn and pretzels. Fat-free sweets. The items listed above may not be all the foods and drinks you can have. Talk to a dietitian to learn more. What foods should I avoid? Fruits Canned fruit in a light or heavy syrup. Fried fruit. Fruit in cream or butter sauce. Vegetables Creamed or fried vegetables. Vegetables in a cheese sauce. Regular canned vegetables that are not marked as low-sodium or reduced-sodium.  Regular canned tomato sauce and paste that are not marked as low-sodium or reduced-sodium. Regular tomato and vegetable juices that are not marked as low-sodium or reduced-sodium. Rosita Fire. Olives. Grains Baked goods made with fat, such as croissants, muffins,  or some breads. Dry pasta or rice meal packs. Meats and other proteins Fatty cuts of meat. Ribs. Fried meat. Tomasa Blase. Bologna, salami, and other precooked or cured meats, such as sausages or meat loaves, that are not lean and low in sodium. Fat from the back of a pig (fatback). Bratwurst. Salted nuts and seeds. Canned beans with added salt. Canned or smoked fish. Whole eggs or egg yolks. Chicken or [place] with skin. Dairy Whole or 2% milk, cream, and half-and-half. Whole or full-fat cream cheese. Whole-fat or sweetened yogurt. Full-fat cheese. Nondairy creamers. Whipped toppings. Processed cheese and cheese spreads. Fats and oils Butter. Stick margarine. Lard. Shortening. Ghee. Bacon fat. Tropical oils, such as coconut, palm kernel, or palm oil. Seasonings and condiments Onion salt, garlic salt, seasoned salt, table salt, and sea salt. Worcestershire sauce. Tartar sauce. Barbecue sauce. Teriyaki sauce. Soy sauce, including reduced-sodium soy sauce. Steak sauce. Canned and packaged gravies. Fish sauce. Oyster sauce. Cocktail sauce. Store-bought horseradish. Ketchup. Mustard. Meat flavorings and tenderizers. Bouillon cubes. Hot sauces. Pre-made or packaged marinades. Pre-made or packaged taco seasonings. Relishes. Regular salad dressings. Other foods Salted popcorn and pretzels. The items listed above may not be all the foods and drinks you should avoid. Talk to a dietitian to learn more. Where to find more information National Heart, Lung, and Blood Institute (NHLBI): BuffaloDryCleaner.gl American Heart Association (AHA): heart.org Academy of Nutrition and Dietetics: eatright.org National Kidney Foundation (NKF): kidney.org This information is not  intended to replace advice given to you by your health care provider. Make sure you discuss any questions you have with your health care provider. Document Revised: 07/04/2022 Document Reviewed: 07/04/2022 Elsevier Patient Education  2024 [employer].

## 2023-10-27 NOTE — Progress Notes (Deleted)
 Office Visit Note  Patient: Tiffany Glover             Date of Birth: [birth year]           MRN: [id number]             PCP: Abonza, Maritza, PA-C (Inactive) Referring: No ref. provider found Visit Date: 11/10/2023 Occupation: @GUAROCC @  Subjective:  No chief complaint on file.   History of Present Illness: Tiffany Glover is a 63 y.o. female ***     Activities of Daily Living:  Patient reports morning stiffness for *** {minute/hour:19697}.   Patient {ACTIONS;DENIES/REPORTS:21021675::"Denies"} nocturnal pain.  Difficulty dressing/grooming: {ACTIONS;DENIES/REPORTS:21021675::"Denies"} Difficulty climbing stairs: {ACTIONS;DENIES/REPORTS:21021675::"Denies"} Difficulty getting out of chair: {ACTIONS;DENIES/REPORTS:21021675::"Denies"} Difficulty using hands for taps, buttons, cutlery, and/or writing: {ACTIONS;DENIES/REPORTS:21021675::"Denies"}  No Rheumatology ROS completed.   PMFS History:  Patient Active Problem List   Diagnosis Date Noted   Sjogren syndrome, unspecified (HCC) 08/15/2022   Physical deconditioning 09/11/2019   Abnormal finding on radiology exam- possible emphasema findings on CT abd/pelvis by urology 09/11/2019   Kidney stone 09/08/2019   Asymptomatic microscopic hematuria 06/03/2018   History of nephrolithiasis- not requiring surgery 06/03/2018   Prediabetes 06/03/2018   Chronic right-sided thoracic back pain-muscle spasms medial to right scapula and inferior to right scapula 03/18/2018   Night muscle spasms-bilateral calves and hamstrings. 03/18/2018   Glucose intolerance (impaired glucose tolerance) 03/18/2018   Urine discoloration 03/18/2018   Dehydration, mild 03/18/2018   Chronic bilateral thoracic back pain 09/26/2017   Myalgia 09/26/2017   Plantar fascial fibromatosis of right foot 11/10/2016   Muscle cramps in legs b/l 11/10/2016   Salt craving 06/11/2016   New Onset- Essential hypertension 06/01/2016   Bilateral impacted cerumen 06/01/2016    Person with feared health complaint in whom no diagnosis is made 06/01/2016   Seborrheic dermatitis- ears, scalp 06/01/2016   Counseling on health promotion and disease prevention 06/01/2016   Vitamin D  insufficiency 03/26/2016   History of laparoscopic cholecystectomy 02/25/2016   History of smoking 30 pack years- Quit 9/ 1/ 96 02/25/2016   Family history of suicide- father age 20 02/25/2016   Family history of alcoholism in father; grandfathers etc 02/25/2016   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   Personal history of noncompliance with medical treatment and regimen 02/25/2016   Elevated blood pressure reading without diagnosis of hypertension 02/25/2016   Incisional hernia 09/15/2014   Incisional hernia, without obstruction or gangrene 08/08/2014   Allergic rhinitis/ seasonal allergies 05/17/2014   DUB (dysfunctional uterine bleeding) 05/17/2014   HLD (hyperlipidemia) 05/17/2014   Elevated fasting blood sugar 05/17/2014   Class 2 obesity in adult 05/17/2014   IFG (impaired fasting glucose) 05/17/2014   Menopausal symptoms 05/17/2014   Shoulder joint pain 05/17/2014   Morbid obesity (HCC) 09/14/2013   h/o Hypothyroidism 09/04/2007   h/o Hemorrhoids, internal, with bleeding 09/04/2007   h/o Hemorrhoids, external 09/04/2007   History of diverticulosis 09/04/2007    Past Medical History:  Diagnosis Date   Complication of anesthesia    hard to wake up once   Diverticulosis of colon    Eczema    Frequency of urination    History of hypothyroidism    10-06-2019  per pt no medication needed in several yrs, lab work normal   History of kidney stones    Hypertension    10-06-2019 per pt her pcp stopped medication 12/ 2019 (note in epic)  due to pt lost weight and bp improved;  currently per pt she is  monitoring bp at home for pcp   Mixed hyperlipidemia    Pre-diabetes    followed by pcp, watching diet   Renal calculus, right    Sjogren syndrome, unspecified (HCC)    Sleep  apnea    Urgency of urination    Varicose vein of leg    Vitamin D  deficiency     Family History  Problem Relation Age of Onset   Cancer Mother        Lung   Suicidality Father    Alcohol abuse Father    Cancer Brother        mouth   Cancer Maternal Grandmother    Breast cancer Maternal Grandmother    Alcohol abuse Maternal Grandfather    Hyperlipidemia Paternal Grandmother    Hypertension Paternal Grandmother    Alcohol abuse Paternal Grandfather    Urolithiasis Son    Colon cancer Neg Hx    Colon polyps Neg Hx    Esophageal cancer Neg Hx    Rectal cancer Neg Hx    Stomach cancer Neg Hx    Past Surgical History:  Procedure Laterality Date   CESAREAN SECTION  x2  last one 1994   CHOLECYSTECTOMY  07/21/2012   Procedure: LAPAROSCOPIC CHOLECYSTECTOMY WITH INTRAOPERATIVE CHOLANGIOGRAM;  Surgeon: Enid Harry, MD;  Location: [facility];  Service: General;  Laterality: N/A;  attempted   CHOLECYSTECTOMY  07/21/2012   Procedure: CHOLECYSTECTOMY;  Surgeon: Enid Harry, MD;  Location: [facility];  Service: General;  Laterality: N/A;   COLONOSCOPY  last one 03-21-2017   CYSTOSCOPY/URETEROSCOPY/HOLMIUM LASER/STENT PLACEMENT Right 10/13/2019   Procedure: CYSTOSCOPY RIGHT RETROGRADE PYELOGRAM URETEROSCOPY/HOLMIUM LASER/STENT PLACEMENT;  Surgeon: Samson Croak, MD;  Location[facility];  Service: Urology;  Laterality: Right;   EXTRACORPOREAL SHOCK WAVE LITHOTRIPSY Right 11/02/2018   Procedure: EXTRACORPOREAL SHOCK WAVE LITHOTRIPSY (ESWL);  Surgeon: Samson Croak, MD;  Location: [facility];  Service: Urology;  Laterality: Right;   FOOT SURGERY  1990s   ganglion cyst removed from right foot   INCISIONAL HERNIA REPAIR N/A 09/15/2014   Procedure: LAPAROSCOPIC INCISIONAL HERNIA REPAIR WITH MESH;  Surgeon: Enid Harry, MD;  Location: [facility];  Service: General;  Laterality: N/A;   vein strippiing  1990s   left leg   Social History   Social History Narrative   Not on  file   Immunization History  Administered Date(s) Administered   PPD Test 12/09/2011     Objective: Vital Signs: LMP 08/13/2014    Physical Exam   Musculoskeletal Exam: ***  CDAI Exam: CDAI Score: -- Patient Global: --; Provider Global: -- Swollen: --; Tender: -- Joint Exam 11/10/2023   No joint exam has been documented for this visit   There is currently no information documented on the homunculus. Go to the Rheumatology activity and complete the homunculus joint exam.  Investigation: No additional findings.  Imaging: No results found.  Recent Labs: Lab Results  Component Value Date   WBC 4.0 10/14/2022   HGB 13.4 10/14/2022   PLT 181 10/14/2022   NA 141 10/14/2022   K 4.2 10/14/2022   CL 103 10/14/2022   CO2 22 10/14/2022   GLUCOSE 94 10/14/2022   BUN 10 10/14/2022   CREATININE 0.62 10/14/2022   BILITOT 0.5 10/14/2022   ALKPHOS 78 01/17/2021   AST 18 10/14/2022   ALT 20 10/14/2022   PROT 7.7 10/14/2022   PROT 7.5 10/14/2022   ALBUMIN 4.2 01/17/2021   CALCIUM 9.3 10/14/2022   GFRAA  115 12/10/2019   QFTBGOLDPLUS NEGATIVE 04/18/2020    Speciality Comments: No specialty comments available.  Procedures:  No procedures performed Allergies: Patient has no known allergies.   Assessment / Plan:     Visit Diagnoses: No diagnosis found.  Orders: No orders of the defined types were placed in this encounter.  No orders of the defined types were placed in this encounter.   Face-to-face time spent with patient was *** minutes. Greater than 50% of time was spent in counseling and coordination of care.  Follow-Up Instructions: No follow-ups on file.   Dee Farber, CMA  Note - This record has been created using Animal nutritionist.  Chart creation errors have been sought, but may not always  have been located. Such creation errors do not reflect on  the standard of medical care.

## 2023-11-10 ENCOUNTER — Ambulatory Visit: Payer: BC Managed Care – PPO | Admitting: Rheumatology

## 2023-11-10 DIAGNOSIS — E559 Vitamin D deficiency, unspecified: Secondary | ICD-10-CM

## 2023-11-10 DIAGNOSIS — Z87442 Personal history of urinary calculi: Secondary | ICD-10-CM

## 2023-11-10 DIAGNOSIS — Z8639 Personal history of other endocrine, nutritional and metabolic disease: Secondary | ICD-10-CM

## 2023-11-10 DIAGNOSIS — G8929 Other chronic pain: Secondary | ICD-10-CM

## 2023-11-10 DIAGNOSIS — Z818 Family history of other mental and behavioral disorders: Secondary | ICD-10-CM

## 2023-11-10 DIAGNOSIS — Z87891 Personal history of nicotine dependence: Secondary | ICD-10-CM

## 2023-11-10 DIAGNOSIS — E782 Mixed hyperlipidemia: Secondary | ICD-10-CM

## 2023-11-10 DIAGNOSIS — M3509 Sicca syndrome with other organ involvement: Secondary | ICD-10-CM

## 2023-11-10 DIAGNOSIS — I10 Essential (primary) hypertension: Secondary | ICD-10-CM

## 2023-11-10 DIAGNOSIS — L301 Dyshidrosis [pompholyx]: Secondary | ICD-10-CM

## 2023-11-10 DIAGNOSIS — L219 Seborrheic dermatitis, unspecified: Secondary | ICD-10-CM

## 2023-11-10 DIAGNOSIS — J842 Lymphoid interstitial pneumonia: Secondary | ICD-10-CM

## 2023-11-10 DIAGNOSIS — Z811 Family history of alcohol abuse and dependence: Secondary | ICD-10-CM

## 2023-11-10 DIAGNOSIS — R7303 Prediabetes: Secondary | ICD-10-CM

## 2023-11-10 DIAGNOSIS — Z9049 Acquired absence of other specified parts of digestive tract: Secondary | ICD-10-CM

## 2023-11-10 DIAGNOSIS — G4739 Other sleep apnea: Secondary | ICD-10-CM

## 2023-11-10 DIAGNOSIS — Z8719 Personal history of other diseases of the digestive system: Secondary | ICD-10-CM

## 2023-11-17 NOTE — Progress Notes (Signed)
 Office Visit Note  Patient: Tiffany Glover             Date of Birth: 17-Apr-1961           MRN: [id number]             PCP: Abonza, Maritza, PA-C (Inactive) Referring: No ref. provider found Visit Date: 12/01/2023 Occupation: @GUAROCC @  Subjective:  Routine follow-up  History of Present Illness: Tiffany Glover is a 63 y.o. female with history of sjogren's syndrome.   Patient was last seen in the office on 04/28/2023.  She is not taking any immunosuppressive agents at this time.  Patient denies any sicca symptoms.  She has not had any oral or nasal ulcerations.  She denies any salivary stones, or swollen salivary glands.  She denies any swollen lymph nodes.  Patient states that her energy level has been stable.  She denies any joint pain or joint swelling at this time.  She continues to see the chiropractor every 2 weeks which she finds to be helpful.  She denies any symptoms of neuropathy.  She denies any new or worsening pulmonary symptoms. Patient has been seeing her eye doctor once a year and the dentist every 6 months.  Activities of Daily Living:  Patient denies any morning stiffness  Patient Denies nocturnal pain.  Difficulty dressing/grooming: Denies Difficulty climbing stairs: Denies Difficulty getting out of chair: Denies Difficulty using hands for taps, buttons, cutlery, and/or writing: Denies  Review of Systems  Constitutional:  Negative for fatigue.  HENT:  Negative for mouth sores and mouth dryness.   Eyes:  Negative for dryness.  Respiratory:  Negative for shortness of breath.   Cardiovascular:  Negative for chest pain and palpitations.  Gastrointestinal:  Negative for blood in stool, constipation and diarrhea.  Endocrine: Negative for increased urination.  Genitourinary:  Negative for involuntary urination.  Musculoskeletal:  Negative for joint pain, gait problem, joint pain, joint swelling, myalgias, muscle weakness, morning stiffness, muscle tenderness and  myalgias.  Skin:  Negative for color change, rash, hair loss and sensitivity to sunlight.  Allergic/Immunologic: Negative for susceptible to infections.  Neurological:  Negative for dizziness and headaches.  Hematological:  Negative for swollen glands.  Psychiatric/Behavioral:  Negative for depressed mood and sleep disturbance. The patient is not nervous/anxious.     PMFS History:  Patient Active Problem List   Diagnosis Date Noted   Sjogren syndrome, unspecified (HCC) 08/15/2022   Physical deconditioning 09/11/2019   Abnormal finding on radiology exam- possible emphasema findings on CT abd/pelvis by urology 09/11/2019   Kidney stone 09/08/2019   Asymptomatic microscopic hematuria 06/03/2018   History of nephrolithiasis- not requiring surgery 06/03/2018   Prediabetes 06/03/2018   Chronic right-sided thoracic back pain-muscle spasms medial to right scapula and inferior to right scapula 03/18/2018   Night muscle spasms-bilateral calves and hamstrings. 03/18/2018   Glucose intolerance (impaired glucose tolerance) 03/18/2018   Urine discoloration 03/18/2018   Dehydration, mild 03/18/2018   Chronic bilateral thoracic back pain 09/26/2017   Myalgia 09/26/2017   Plantar fascial fibromatosis of right foot 11/10/2016   Muscle cramps in legs b/l 11/10/2016   Salt craving 06/11/2016   New Onset- Essential hypertension 06/01/2016   Bilateral impacted cerumen 06/01/2016   Person with feared health complaint in whom no diagnosis is made 06/01/2016   Seborrheic dermatitis- ears, scalp 06/01/2016   Counseling on health promotion and disease prevention 06/01/2016   Vitamin D  insufficiency 03/26/2016   History of laparoscopic cholecystectomy 02/25/2016  History of smoking 30 pack years- Quit 9/ 1/ 96 02/25/2016   Family history of suicide- father age 70 02/25/2016   Family history of alcoholism in father; grandfathers etc 02/25/2016   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   Personal history of  noncompliance with medical treatment and regimen 02/25/2016   Elevated blood pressure reading without diagnosis of hypertension 02/25/2016   Incisional hernia 09/15/2014   Incisional hernia, without obstruction or gangrene 08/08/2014   Allergic rhinitis/ seasonal allergies 05/17/2014   DUB (dysfunctional uterine bleeding) 05/17/2014   HLD (hyperlipidemia) 05/17/2014   Elevated fasting blood sugar 05/17/2014   Class 2 obesity in adult 05/17/2014   IFG (impaired fasting glucose) 05/17/2014   Menopausal symptoms 05/17/2014   Shoulder joint pain 05/17/2014   Morbid obesity (HCC) 09/14/2013   h/o Hypothyroidism 09/04/2007   h/o Hemorrhoids, internal, with bleeding 09/04/2007   h/o Hemorrhoids, external 09/04/2007   History of diverticulosis 09/04/2007    Past Medical History:  Diagnosis Date   Complication of anesthesia    hard to wake up once   Diverticulosis of colon    Eczema    Frequency of urination    History of hypothyroidism    10-06-2019  per pt no medication needed in several yrs, lab work normal   History of kidney stones    Hypertension    10-06-2019 per pt her pcp stopped medication 12/ 2019 (note in epic)  due to pt lost weight and bp improved;  currently per pt she is monitoring bp at home for pcp   Mixed hyperlipidemia    Pre-diabetes    followed by pcp, watching diet   Renal calculus, right    Sjogren syndrome, unspecified (HCC)    Sleep apnea    Urgency of urination    Varicose vein of leg    Vitamin D  deficiency     Family History  Problem Relation Age of Onset   Cancer Mother        Lung   Suicidality Father    Alcohol abuse Father    Cancer Brother        mouth   Cancer Maternal Grandmother    Breast cancer Maternal Grandmother    Alcohol abuse Maternal Grandfather    Hyperlipidemia Paternal Grandmother    Hypertension Paternal Grandmother    Alcohol abuse Paternal Grandfather    Urolithiasis Son    Colon cancer Neg Hx    Colon polyps Neg Hx     Esophageal cancer Neg Hx    Rectal cancer Neg Hx    Stomach cancer Neg Hx    Past Surgical History:  Procedure Laterality Date   CESAREAN SECTION  x2  last one 1994   CHOLECYSTECTOMY  07/21/2012   Procedure: LAPAROSCOPIC CHOLECYSTECTOMY WITH INTRAOPERATIVE CHOLANGIOGRAM;  Surgeon: Enid Harry, MD;  Location: [facility];  Service: General;  Laterality: N/A;  attempted   CHOLECYSTECTOMY  07/21/2012   Procedure: CHOLECYSTECTOMY;  Surgeon: Enid Harry, MD;  Location: [facility];  Service: General;  Laterality: N/A;   COLONOSCOPY  last one 03-21-2017   CYSTOSCOPY/URETEROSCOPY/HOLMIUM LASER/STENT PLACEMENT Right 10/13/2019   Procedure: CYSTOSCOPY RIGHT RETROGRADE PYELOGRAM URETEROSCOPY/HOLMIUM LASER/STENT PLACEMENT;  Surgeon: Samson Croak, MD;  Location: [facility];  Service: Urology;  Laterality: Right;   EXTRACORPOREAL SHOCK WAVE LITHOTRIPSY Right 11/02/2018   Procedure: EXTRACORPOREAL SHOCK WAVE LITHOTRIPSY (ESWL);  Surgeon: Samson Croak, MD;  Location: [facility];  Service: Urology;  Laterality: Right;   FOOT SURGERY  1990s  ganglion cyst removed from right foot   INCISIONAL HERNIA REPAIR N/A 09/15/2014   Procedure: LAPAROSCOPIC INCISIONAL HERNIA REPAIR WITH MESH;  Surgeon: Enid Harry, MD;  Location: [facility];  Service: General;  Laterality: N/A;   vein strippiing  1990s   left leg   Social History   Social History Narrative   Not on file   Immunization History  Administered Date(s) Administered   PPD Test 12/09/2011     Objective: Vital Signs: BP (!) 151/84 (BP Location: Left Arm, Patient Position: Sitting, Cuff Size: Normal)   Pulse 60   Resp 16   Ht 5\' 4"  (1.626 m)   Wt 238 lb 6.4 oz (108.1 kg)   LMP 08/13/2014   BMI 40.92 kg/m    Physical Exam Vitals and nursing note reviewed.  Constitutional:      Appearance: She is well-developed.  HENT:     Head: Normocephalic and atraumatic.  Eyes:     Conjunctiva/sclera: Conjunctivae normal.   Cardiovascular:     Rate and Rhythm: Normal rate and regular rhythm.     Heart sounds: Normal heart sounds.  Pulmonary:     Effort: Pulmonary effort is normal.     Breath sounds: Normal breath sounds.  Abdominal:     General: Bowel sounds are normal.     Palpations: Abdomen is soft.  Musculoskeletal:     Cervical back: Normal range of motion.  Lymphadenopathy:     Cervical: No cervical adenopathy.  Skin:    General: Skin is warm and dry.     Capillary Refill: Capillary refill takes less than 2 seconds.  Neurological:     Mental Status: She is alert and oriented to person, place, and time.  Psychiatric:        Behavior: Behavior normal.      Musculoskeletal Exam: C-spine, thoracic spine, lumbar spine and good range of motion.  Shoulder joints, elbow joints, wrist joints, MCPs, PIPs, DIPs have good range of motion with no synovitis.  Complete fist formation bilaterally.  Hip joints have good range of motion with no groin pain.  Knee joints have good range of motion with no warmth or effusion.  Ankle joints have good range of motion with no tenderness or joint swelling.  CDAI Exam: CDAI Score: -- Patient Global: --; Provider Global: -- Swollen: --; Tender: -- Joint Exam 12/01/2023   No joint exam has been documented for this visit   There is currently no information documented on the homunculus. Go to the Rheumatology activity and complete the homunculus joint exam.  Investigation: No additional findings.  Imaging: No results found.  Recent Labs: Lab Results  Component Value Date   WBC 4.0 10/14/2022   HGB 13.4 10/14/2022   PLT 181 10/14/2022   NA 141 10/14/2022   K 4.2 10/14/2022   CL 103 10/14/2022   CO2 22 10/14/2022   GLUCOSE 94 10/14/2022   BUN 10 10/14/2022   CREATININE 0.62 10/14/2022   BILITOT 0.5 10/14/2022   ALKPHOS 78 01/17/2021   AST 18 10/14/2022   ALT 20 10/14/2022   PROT 7.7 10/14/2022   PROT 7.5 10/14/2022   ALBUMIN 4.2 01/17/2021   CALCIUM  9.3 10/14/2022   GFRAA 115 12/10/2019   QFTBGOLDPLUS NEGATIVE 04/18/2020    Speciality Comments: No specialty comments available.  Procedures:  No procedures performed Allergies: Patient has no known allergies.   Assessment / Plan:     Visit Diagnoses: Sjogren's syndrome with other organ involvement (HCC) - Positive ANA, positive  Ro, dry mouth, dry eyes and dry skin: She has not been experiencing any symptoms of dry eyes or dry mouth.  No oral or nasal ulcerations.  No cervical lymphadenopathy was noted.  No parotid swelling or tenderness noted.  No signs of inflammatory arthritis were noted.  No synovitis was apparent on exam.  Her energy level has been stable.  No new or worsening pulmonary symptoms.  Her lungs were clear to auscultation today. Lab work from 10/14/2022 was reviewed today in the office: ANA remains positive, Ro antibody remains positive, La antibody remains positive, SPEP normal, RF negative, ESR within normal limits, and complements within normal limits. Plan to obtain the following lab work today for further evaluation.  Discussed the increased risk for developing lymphoma in patients with Sjogren's syndrome. She does not currently require immunosuppressive therapy.  She was advised to notify us  if she develops any new or worsening symptoms.  Encouraged the patient to see ophthalmology on a yearly basis in the dentist at least every 6 months.  She will follow-up in the office in 6 months or sooner if needed.- Plan: Urinalysis, Routine w reflex microscopic, CBC with Differential/Platelet, Comprehensive metabolic panel with GFR, Sedimentation rate, C3 and C4, Serum protein electrophoresis with reflex  Idiopathic lymphocytic interstitial pneumonitis (HCC) - Evaluated by Dr. Matilde Son.  Lungs were clear to auscultation today.  No new or worsening pulmonary symptoms.  Chronic right-sided thoracic back pain-muscle spasms medial to right scapula and inferior to right scapula: Not currently  symptomatic.  Patient has been going to the chiropractor every 2 weeks.  Other medical conditions are listed as follows:  Mixed hyperlipidemia  History of diverticulosis  Essential hypertension: Blood pressure was elevated today in the office and was rechecked prior to leaving.  Patient plans on monitoring blood pressure closely and will reach out to PCP if blood pressure remains elevated.  She has recently tried several antihypertensive medications but had to discontinue due to intolerance.  She will be following up this month to discuss other treatment options.  Prediabetes  History of nephrolithiasis- not requiring surgery  History of hypothyroidism  Vitamin D  insufficiency  Other sleep apnea  Dyshidrotic hand dermatitis  Seborrheic dermatitis- ears, scalp  History of smoking 30 pack years- Quit 9/ 1/ 96  History of laparoscopic cholecystectomy  Family history of suicide- father age 76  Family history of alcoholism in father; grandfathers etc  Orders: Orders Placed This Encounter  Procedures   Urinalysis, Routine w reflex microscopic   CBC with Differential/Platelet   Comprehensive metabolic panel with GFR   Sedimentation rate   C3 and C4   Serum protein electrophoresis with reflex   No orders of the defined types were placed in this encounter.    Follow-Up Instructions: Return in about 6 months (around 06/01/2024) for Sjogren's syndrome.   Romayne Clubs, PA-C  Note - This record has been created using Dragon software.  Chart creation errors have been sought, but may not always  have been located. Such creation errors do not reflect on  the standard of medical care.

## 2023-12-01 ENCOUNTER — Encounter: Payer: Self-pay | Admitting: Physician Assistant

## 2023-12-01 ENCOUNTER — Ambulatory Visit: Payer: Self-pay | Attending: Physician Assistant | Admitting: Physician Assistant

## 2023-12-01 VITALS — BP 151/84 | HR 60 | Resp 16 | Ht 64.0 in | Wt 238.4 lb

## 2023-12-01 DIAGNOSIS — G8929 Other chronic pain: Secondary | ICD-10-CM

## 2023-12-01 DIAGNOSIS — Z811 Family history of alcohol abuse and dependence: Secondary | ICD-10-CM

## 2023-12-01 DIAGNOSIS — L219 Seborrheic dermatitis, unspecified: Secondary | ICD-10-CM

## 2023-12-01 DIAGNOSIS — M3509 Sicca syndrome with other organ involvement: Secondary | ICD-10-CM | POA: Diagnosis not present

## 2023-12-01 DIAGNOSIS — J842 Lymphoid interstitial pneumonia: Secondary | ICD-10-CM

## 2023-12-01 DIAGNOSIS — Z818 Family history of other mental and behavioral disorders: Secondary | ICD-10-CM

## 2023-12-01 DIAGNOSIS — Z87442 Personal history of urinary calculi: Secondary | ICD-10-CM

## 2023-12-01 DIAGNOSIS — Z8719 Personal history of other diseases of the digestive system: Secondary | ICD-10-CM

## 2023-12-01 DIAGNOSIS — Z9049 Acquired absence of other specified parts of digestive tract: Secondary | ICD-10-CM

## 2023-12-01 DIAGNOSIS — M546 Pain in thoracic spine: Secondary | ICD-10-CM

## 2023-12-01 DIAGNOSIS — E782 Mixed hyperlipidemia: Secondary | ICD-10-CM | POA: Diagnosis not present

## 2023-12-01 DIAGNOSIS — Z8639 Personal history of other endocrine, nutritional and metabolic disease: Secondary | ICD-10-CM

## 2023-12-01 DIAGNOSIS — R7303 Prediabetes: Secondary | ICD-10-CM

## 2023-12-01 DIAGNOSIS — G4739 Other sleep apnea: Secondary | ICD-10-CM

## 2023-12-01 DIAGNOSIS — L301 Dyshidrosis [pompholyx]: Secondary | ICD-10-CM

## 2023-12-01 DIAGNOSIS — Z87891 Personal history of nicotine dependence: Secondary | ICD-10-CM

## 2023-12-01 DIAGNOSIS — E559 Vitamin D deficiency, unspecified: Secondary | ICD-10-CM

## 2023-12-01 DIAGNOSIS — I10 Essential (primary) hypertension: Secondary | ICD-10-CM

## 2023-12-02 ENCOUNTER — Ambulatory Visit: Payer: Self-pay | Admitting: Physician Assistant

## 2023-12-02 NOTE — Progress Notes (Signed)
 Glucose is 118. Rest of CMP WNL CBC WNL ESR WNL UA normal.

## 2023-12-02 NOTE — Progress Notes (Signed)
Complements WNL

## 2023-12-03 LAB — PROTEIN ELECTROPHORESIS, SERUM, WITH REFLEX
Albumin ELP: 4.3 g/dL (ref 3.8–4.8)
Alpha 1: 0.3 g/dL (ref 0.2–0.3)
Alpha 2: 0.8 g/dL (ref 0.5–0.9)
Beta 2: 0.4 g/dL (ref 0.2–0.5)
Beta Globulin: 0.5 g/dL (ref 0.4–0.6)
Gamma Globulin: 1.3 g/dL (ref 0.8–1.7)
Total Protein: 7.6 g/dL (ref 6.1–8.1)

## 2023-12-03 LAB — CBC WITH DIFFERENTIAL/PLATELET
Absolute Lymphocytes: 1069 {cells}/uL (ref 850–3900)
Absolute Monocytes: 425 {cells}/uL (ref 200–950)
Basophils Absolute: 39 {cells}/uL (ref 0–200)
Basophils Relative: 1 %
Eosinophils Absolute: 117 {cells}/uL (ref 15–500)
Eosinophils Relative: 3 %
HCT: 41.2 % (ref 35.0–45.0)
Hemoglobin: 13.5 g/dL (ref 11.7–15.5)
MCH: 31 pg (ref 27.0–33.0)
MCHC: 32.8 g/dL (ref 32.0–36.0)
MCV: 94.7 fL (ref 80.0–100.0)
MPV: 12.4 fL (ref 7.5–12.5)
Monocytes Relative: 10.9 %
Neutro Abs: 2250 {cells}/uL (ref 1500–7800)
Neutrophils Relative %: 57.7 %
Platelets: 197 10*3/uL (ref 140–400)
RBC: 4.35 10*6/uL (ref 3.80–5.10)
RDW: 13.5 % (ref 11.0–15.0)
Total Lymphocyte: 27.4 %
WBC: 3.9 10*3/uL (ref 3.8–10.8)

## 2023-12-03 LAB — COMPREHENSIVE METABOLIC PANEL WITH GFR
AG Ratio: 1.6 (calc) (ref 1.0–2.5)
ALT: 16 U/L (ref 6–29)
AST: 16 U/L (ref 10–35)
Albumin: 4.7 g/dL (ref 3.6–5.1)
Alkaline phosphatase (APISO): 81 U/L (ref 37–153)
BUN: 19 mg/dL (ref 7–25)
CO2: 27 mmol/L (ref 20–32)
Calcium: 9.5 mg/dL (ref 8.6–10.4)
Chloride: 104 mmol/L (ref 98–110)
Creat: 0.57 mg/dL (ref 0.50–1.05)
Globulin: 2.9 g/dL (ref 1.9–3.7)
Glucose, Bld: 118 mg/dL — ABNORMAL HIGH (ref 65–99)
Potassium: 4.3 mmol/L (ref 3.5–5.3)
Sodium: 140 mmol/L (ref 135–146)
Total Bilirubin: 0.4 mg/dL (ref 0.2–1.2)
Total Protein: 7.6 g/dL (ref 6.1–8.1)
eGFR: 102 mL/min/{1.73_m2} (ref >=60)

## 2023-12-03 LAB — URINALYSIS, ROUTINE W REFLEX MICROSCOPIC
Bilirubin Urine: NEGATIVE
Glucose, UA: NEGATIVE
Hgb urine dipstick: NEGATIVE
Ketones, ur: NEGATIVE
Leukocytes,Ua: NEGATIVE
Nitrite: NEGATIVE
Protein, ur: NEGATIVE
Specific Gravity, Urine: 1.004 (ref 1.001–1.035)
pH: 6.5 (ref 5.0–8.0)

## 2023-12-03 LAB — SEDIMENTATION RATE: Sed Rate: 9 mm/h (ref 0–30)

## 2023-12-03 LAB — C3 AND C4
C3 Complement: 151 mg/dL (ref 83–193)
C4 Complement: 21 mg/dL (ref 15–57)

## 2023-12-04 NOTE — Progress Notes (Signed)
 SPEP  normal.

## 2024-01-20 ENCOUNTER — Encounter (INDEPENDENT_AMBULATORY_CARE_PROVIDER_SITE_OTHER): Payer: Self-pay

## 2024-03-24 ENCOUNTER — Ambulatory Visit (INDEPENDENT_AMBULATORY_CARE_PROVIDER_SITE_OTHER): Admitting: Audiology

## 2024-03-24 ENCOUNTER — Ambulatory Visit (INDEPENDENT_AMBULATORY_CARE_PROVIDER_SITE_OTHER)

## 2024-03-24 ENCOUNTER — Encounter (INDEPENDENT_AMBULATORY_CARE_PROVIDER_SITE_OTHER): Payer: Self-pay

## 2024-03-24 VITALS — BP 145/63 | HR 59 | Temp 97.7°F | Ht 64.0 in | Wt 235.0 lb

## 2024-03-24 DIAGNOSIS — H60543 Acute eczematoid otitis externa, bilateral: Secondary | ICD-10-CM | POA: Diagnosis not present

## 2024-03-24 DIAGNOSIS — H903 Sensorineural hearing loss, bilateral: Secondary | ICD-10-CM

## 2024-03-24 DIAGNOSIS — H9311 Tinnitus, right ear: Secondary | ICD-10-CM

## 2024-03-24 DIAGNOSIS — T50905A Adverse effect of unspecified drugs, medicaments and biological substances, initial encounter: Secondary | ICD-10-CM

## 2024-03-24 DIAGNOSIS — H905 Unspecified sensorineural hearing loss: Secondary | ICD-10-CM

## 2024-03-24 MED ORDER — FLUOCINOLONE ACETONIDE 0.01 % OT OIL: 3.0000 [drp] | TOPICAL_OIL | Freq: Two times a day (BID) | OTIC | 2 refills | Status: AC | PRN

## 2024-03-24 NOTE — Patient Instructions (Signed)
 Call if you want an MRI Ear Call if you want a repeat hearing test sooner Return if your symptoms worsen.

## 2024-03-24 NOTE — Progress Notes (Signed)
 Dear Dr. Dayna, Here is my assessment for our mutual patient, Tiffany Glover. Thank you for allowing me the opportunity to care for your patient. Please do not hesitate to contact me should you have any other questions. Sincerely, Dr. Penne Croak  Otolaryngology Clinic Note Referring provider: Dr. Dayna HPI:  Tiffany Glover is a 63 y.o. female kindly referred by Dr. Dayna for evaluation of tinnitus and ear fullness.  Reason for Consult  Patient presents with right-sided ear congestion and tinnitus, described as 'crickets', following initiation of blood pressure medication.  History of Present Illness  The patient reports the onset of right-sided ear congestion and tinnitus described as 'crickets' after starting a blood pressure medication (amlodipine) approximately one year ago. The symptoms began three to four days after starting the medication. The patient has since been switched to another blood pressure medication, Telmisartan, one month ago. There is no reported loud noise exposure. 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 The patient denies dizziness or pain but reports itching in the ears.  Symptoms are not affecting sleep. Has not tried hearing aids or white noise.   Independent Review of Additional Tests or Records:  I personally reviewed and interpreted audiogram  Right with normal hearing downsloping to mild SNHL in the HF. Right ear is 5db worse than left across 5 frequencies.  Left with normal hearing downsloping to mild SNHL in the HF.  WRS 100% AU SRT Right 20, left 15 Tymp type A AU   PMH/Meds/All/SocHx/FamHx/ROS:   Past Medical History:  Diagnosis Date   Complication of anesthesia    hard to wake up once   Diverticulosis of colon    Eczema    Frequency of urination    History of hypothyroidism    10-06-2019  per pt no medication needed in several yrs, lab work normal   History of kidney stones    Hypertension     10-06-2019 per pt her pcp stopped medication 12/ 2019 (note in epic)  due to pt lost weight and bp improved;  currently per pt she is monitoring bp at home for pcp   Mixed hyperlipidemia    Pre-diabetes    followed by pcp, watching diet   Renal calculus, right    Sjogren syndrome, unspecified    Sleep apnea    Urgency of urination    Varicose vein of leg    Vitamin D  deficiency      Past Surgical History:  Procedure Laterality Date   CESAREAN SECTION  x2  last one 1994   CHOLECYSTECTOMY  07/21/2012   Procedure: LAPAROSCOPIC CHOLECYSTECTOMY WITH INTRAOPERATIVE CHOLANGIOGRAM;  Surgeon: Donnice Bury, MD;  Location: [facility];  Service: General;  Laterality: N/A;  attempted   CHOLECYSTECTOMY  07/21/2012   Procedure: CHOLECYSTECTOMY;  Surgeon: Donnice Bury, MD;  Location: [facility];  Service: General;  Laterality: N/A;   COLONOSCOPY  last one 03-21-2017   CYSTOSCOPY/URETEROSCOPY/HOLMIUM LASER/STENT PLACEMENT Right 10/13/2019   Procedure: CYSTOSCOPY RIGHT RETROGRADE PYELOGRAM URETEROSCOPY/HOLMIUM LASER/STENT PLACEMENT;  Surgeon: Carolee Sherwood JONETTA DOUGLAS, MD;  Location: [facility];  Service: Urology;  Laterality: Right;   EXTRACORPOREAL SHOCK WAVE LITHOTRIPSY Right 11/02/2018   Procedure: EXTRACORPOREAL SHOCK WAVE LITHOTRIPSY (ESWL);  Surgeon: Carolee Sherwood JONETTA DOUGLAS, MD;  Location: [facility];  Service: Urology;  Laterality: Right;   FOOT SURGERY  1990s   ganglion cyst removed from right foot   INCISIONAL HERNIA REPAIR N/A 09/15/2014   Procedure: LAPAROSCOPIC INCISIONAL HERNIA REPAIR WITH MESH;  Surgeon: Donnice Bury, MD;  Location: [facility];  Service: General;  Laterality: N/A;   vein strippiing  1990s   left leg    Family History  Problem Relation Age of Onset   Cancer Mother        Lung   Suicidality Father    Alcohol abuse Father    Cancer Brother        mouth   Cancer Maternal Grandmother    Breast cancer Maternal Grandmother    Alcohol abuse Maternal Grandfather     Hyperlipidemia Paternal Grandmother    Hypertension Paternal Grandmother    Alcohol abuse Paternal Grandfather    Urolithiasis Son    Colon cancer Neg Hx    Colon polyps Neg Hx    Esophageal cancer Neg Hx    Rectal cancer Neg Hx    Stomach cancer Neg Hx      Social Connections: Not on file      Current Outpatient Medications:    Cholecalciferol (VITAMIN D3) 5000 units TABS, 5,000 IU OTC vitamin D3 daily. (Patient taking differently: Take 1 tablet by mouth daily. 5,000 IU OTC vitamin D3 daily.), Disp: 90 tablet, Rfl: 3   Fluocinolone  Acetonide 0.01 % OIL, Place 3 drops in ear(s) 2 (two) times daily as needed., Disp: 20 mL, Rfl: 2   Multiple Vitamin (MULTIVITAMIN) tablet, Take 1 tablet by mouth daily., Disp: , Rfl:    telmisartan (MICARDIS) 40 MG tablet, Take 40 mg by mouth daily., Disp: , Rfl:    triamcinolone  cream (KENALOG ) 0.1 %, Apply 1 Application topically 2 (two) times daily. (Patient taking differently: Apply 1 Application topically 2 (two) times daily. Taking as needed), Disp: , Rfl:    Physical Exam:   BP (!) 145/63 (BP Location: Right Arm, Patient Position: Sitting) Comment: blood pressure cuff got to tight and she was in a little pain so it caused her bp to go up  Pulse (!) 59   Temp 97.7 F (36.5 C)   Ht 5' 4 (1.626 m)   Wt 235 lb (106.6 kg)   LMP 08/13/2014   SpO2 99%   BMI 40.34 kg/m   The patient was awake, alert, and appropriate. The external ears were inspected, and otoscopy was performed to evaluate the external auditory canals and tympanic membranes. The nasal cavity and septum were examined for mucosal changes, obstruction, or discharge. The oral cavity and oropharynx were inspected for mucosal lesions, infection, or tonsillar hypertrophy. The neck was palpated for lymphadenopathy, thyroid abnormalities, or other masses. Cranial nerve function was grossly intact.  Pertinent Findings: Mild retraction in pars flaccida bilaterally. Normal landmarks. EAC with  eczematoid changes AU.   Seprately Identifiable Procedures:  I personally ordered, reviewed and interpreted the following with the patient today  Procedure: Bilateral ear microscopy using microscope (CPT [phone]) Pre-procedure diagnosis: tinnitus Post-procedure diagnosis: same Indication: see above; given patient's otologic complaints and history, for improved and comprehensive examination of external ear and tympanic membrane, bilateral otologic examination using microscope was performed. Prior to proceeding, verbal consent was obtained after discussion of R/B/A  Procedure: Patient was placed semi-recumbent. Both ear canals were examined using the microscope with findings below. Patient tolerated the procedure well.  Right ear:  No significant lesions pinna. EAC: no significant lesions. Canal is clear. Eczematoid changes. bilateally TM: Intact    Impression & Plans:  Tiffany Glover is a 63 y.o. female with unilateral tinnitus, mild asymmetry on audiogram and bilateral chronic eczematoid otitis externa.   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 Eczema of external ear, bilateral   3. Adverse effect of drug, initial encounter   4. Tinnitus of right ear    Plan - Findings and diagnoses discussed in detail with the patient. - Risks, benefits, and alternatives were reviewed. Through shared decision making, the patient elects to proceed with observation and repeat audiogram in 6 months. We discussed obtaining an MRI for the asymmetry and possible retrocochlear process but she would like to hold off for now. She will call if she wants to change her mind. - Orders placed:  Orders Placed This Encounter  Procedures   Ambulatory referral to Audiology   - Medications prescribed/continued/adjusted:  Meds ordered this encounter  Medications   Fluocinolone  Acetonide 0.01 % OIL    Sig: Place 3 drops in ear(s) 2 (two) times daily as needed.    Dispense:  20 mL    Refill:  2   -  Education materials provided to the patient. - Follow up: 6months. Patient instructed to return sooner or go to the ED if new/worsening symptoms develop.   Thank you for allowing me the opportunity to care for your patient. Please do not hesitate to contact me should you have any other questions.  Sincerely, Penne Croak, DO Otolaryngologist (ENT) [facility] Phone: [phone] Fax: [phone]  03/24/2024, 2:19 PM  \

## 2024-03-24 NOTE — Progress Notes (Signed)
  [address] [place], [place] [address] [phone]  Audiological Evaluation    Name: Tiffany Glover     DOB:   06-29-1961      MRN:   [id number]                                                                                     Service Date: 03/24/2024     Accompanied by: unaccompanied   Patient comes today after Dr. Penne Croak, ENT sent a referral for a hearing evaluation due to concerns with tinnitus.   Symptoms Yes Details  Hearing loss  []    Tinnitus  [x]  Reports hears crickets in the right ear. Doe snot affect her sleep.  Ear pain/ infections/pressure  []    Balance problems  []    Noise exposure history  []    Previous ear surgeries  []    Family history of hearing loss  []    Amplification  []    Other  []      Otoscopy: Right ear: Clear external ear canal and notable landmarks visualized on the tympanic membrane. Left ear:  Clear external ear canal and notable landmarks visualized on the tympanic membrane.  Tympanometry: Right ear: Type As- Normal external ear canal volume with normal middle ear pressure and low tympanic membrane compliance. Left ear: Type A- Normal external ear canal volume with normal middle ear pressure and tympanic membrane compliance.   Pure tone Audiometry: Right ear- Normal hearing from [phone], then mild  sensorineural hearing loss from 3000 Hz - 8000 Hz. Left ear-  Normal hearing from [phone] Hz, except for a mild  sensorineural hearing loss at  4000 Hz.  Speech Audiometry: Right ear- Speech Reception Threshold (SRT) was obtained at 20 dBHL. Left ear-Speech Reception Threshold (SRT) was obtained at 15 dBHL.   Word Recognition Score Tested using NU-6 (recorded) Right ear: 100% was obtained at a presentation level of 60 dBHL with contralateral masking which is deemed as  excellent. Left ear: 100% was obtained at a presentation level of 60 dBHL with contralateral masking which is deemed as  excellent.   The hearing  test results were completed under headphones and re-checked with inserts and results are deemed to be of good reliability. Test technique:  conventional    Impression: There is a slight difference in pure tones between ears, right  worse from 6000-8000 Hz.  Recommendations: Follow up with ENT as scheduled for today. Return for a hearing evaluation in 2 years, before if concerns with hearing or tinnitus changes arise or per MD recommendation. Consider various tinnitus strategies, including the use of a sound generator, and/or tinnitus retraining therapy, if needed.    Troy Hartzog MARIE LEROUX-MARTINEZ, AUD

## 2024-05-24 NOTE — Progress Notes (Deleted)
 Office Visit Note  Patient: Tiffany Glover             Date of Birth: [birth year]           MRN: [id number]             PCP: Dayna Motto, DO Referring: No ref. provider found Visit Date: 06/07/2024 Occupation: Data Unavailable  Subjective:  No chief complaint on file.   History of Present Illness: Tiffany Glover is a 63 y.o. female ***     Activities of Daily Living:  Patient reports morning stiffness for *** {minute/hour:19697}.   Patient {ACTIONS;DENIES/REPORTS:21021675::Denies} nocturnal pain.  Difficulty dressing/grooming: {ACTIONS;DENIES/REPORTS:21021675::Denies} Difficulty climbing stairs: {ACTIONS;DENIES/REPORTS:21021675::Denies} Difficulty getting out of chair: {ACTIONS;DENIES/REPORTS:21021675::Denies} Difficulty using hands for taps, buttons, cutlery, and/or writing: {ACTIONS;DENIES/REPORTS:21021675::Denies}  No Rheumatology ROS completed.   PMFS History:  Patient Active Problem List   Diagnosis Date Noted   Sjogren syndrome, unspecified 08/15/2022   Physical deconditioning 09/11/2019   Abnormal finding on radiology exam- possible emphasema findings on CT abd/pelvis by urology 09/11/2019   Kidney stone 09/08/2019   Asymptomatic microscopic hematuria 06/03/2018   History of nephrolithiasis- not requiring surgery 06/03/2018   Prediabetes 06/03/2018   Chronic right-sided thoracic back pain-muscle spasms medial to right scapula and inferior to right scapula 03/18/2018   Night muscle spasms-bilateral calves and hamstrings. 03/18/2018   Glucose intolerance (impaired glucose tolerance) 03/18/2018   Urine discoloration 03/18/2018   Dehydration, mild 03/18/2018   Chronic bilateral thoracic back pain 09/26/2017   Myalgia 09/26/2017   Plantar fascial fibromatosis of right foot 11/10/2016   Muscle cramps in legs b/l 11/10/2016   Salt craving 06/11/2016   New Onset- Essential hypertension 06/01/2016   Bilateral impacted cerumen 06/01/2016   Person with  feared health complaint in whom no diagnosis is made 06/01/2016   Seborrheic dermatitis- ears, scalp 06/01/2016   Counseling on health promotion and disease prevention 06/01/2016   Vitamin D  insufficiency 03/26/2016   History of laparoscopic cholecystectomy 02/25/2016   History of smoking 30 pack years- Quit 9/ 1/ 96 02/25/2016   Family history of suicide- father age 74 02/25/2016   Family history of alcoholism in father; grandfathers etc 02/25/2016   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   Personal history of noncompliance with medical treatment and regimen 02/25/2016   Elevated blood pressure reading without diagnosis of hypertension 02/25/2016   Incisional hernia 09/15/2014   Incisional hernia, without obstruction or gangrene 08/08/2014   Allergic rhinitis/ seasonal allergies 05/17/2014   DUB (dysfunctional uterine bleeding) 05/17/2014   HLD (hyperlipidemia) 05/17/2014   Elevated fasting blood sugar 05/17/2014   Class 2 obesity in adult 05/17/2014   IFG (impaired fasting glucose) 05/17/2014   Menopausal symptoms 05/17/2014   Shoulder joint pain 05/17/2014   Morbid obesity (HCC) 09/14/2013   h/o Hypothyroidism 09/04/2007   h/o Hemorrhoids, internal, with bleeding 09/04/2007   h/o Hemorrhoids, external 09/04/2007   History of diverticulosis 09/04/2007    Past Medical History:  Diagnosis Date   Complication of anesthesia    hard to wake up once   Diverticulosis of colon    Eczema    Frequency of urination    History of hypothyroidism    10-06-2019  per pt no medication needed in several yrs, lab work normal   History of kidney stones    Hypertension    10-06-2019 per pt her pcp stopped medication 12/ 2019 (note in epic)  due to pt lost weight and bp improved;  currently per pt she is monitoring  bp at home for pcp   Mixed hyperlipidemia    Pre-diabetes    followed by pcp, watching diet   Renal calculus, right    Sjogren syndrome, unspecified    Sleep apnea    Urgency of  urination    Varicose vein of leg    Vitamin D  deficiency     Family History  Problem Relation Age of Onset   Cancer Mother        Lung   Suicidality Father    Alcohol abuse Father    Cancer Brother        mouth   Cancer Maternal Grandmother    Breast cancer Maternal Grandmother    Alcohol abuse Maternal Grandfather    Hyperlipidemia Paternal Grandmother    Hypertension Paternal Grandmother    Alcohol abuse Paternal Grandfather    Urolithiasis Son    Colon cancer Neg Hx    Colon polyps Neg Hx    Esophageal cancer Neg Hx    Rectal cancer Neg Hx    Stomach cancer Neg Hx    Past Surgical History:  Procedure Laterality Date   CESAREAN SECTION  x2  last one 1994   CHOLECYSTECTOMY  07/21/2012   Procedure: LAPAROSCOPIC CHOLECYSTECTOMY WITH INTRAOPERATIVE CHOLANGIOGRAM;  Surgeon: Donnice Bury, MD;  Location: [facility];  Service: General;  Laterality: N/A;  attempted   CHOLECYSTECTOMY  07/21/2012   Procedure: CHOLECYSTECTOMY;  Surgeon: Donnice Bury, MD;  Location: [facility];  Service: General;  Laterality: N/A;   COLONOSCOPY  last one 03-21-2017   CYSTOSCOPY/URETEROSCOPY/HOLMIUM LASER/STENT PLACEMENT Right 10/13/2019   Procedure: CYSTOSCOPY RIGHT RETROGRADE PYELOGRAM URETEROSCOPY/HOLMIUM LASER/STENT PLACEMENT;  Surgeon: Carolee Sherwood JONETTA DOUGLAS, MD;  Location: [facility];  Service: Urology;  Laterality: Right;   EXTRACORPOREAL SHOCK WAVE LITHOTRIPSY Right 11/02/2018   Procedure: EXTRACORPOREAL SHOCK WAVE LITHOTRIPSY (ESWL);  Surgeon: Carolee Sherwood JONETTA DOUGLAS, MD;  Location: [facility];  Service: Urology;  Laterality: Right;   FOOT SURGERY  1990s   ganglion cyst removed from right foot   INCISIONAL HERNIA REPAIR N/A 09/15/2014   Procedure: LAPAROSCOPIC INCISIONAL HERNIA REPAIR WITH MESH;  Surgeon: Donnice Bury, MD;  Location: [facility];  Service: General;  Laterality: N/A;   vein strippiing  1990s   left leg   Social History   Tobacco Use   Smoking status: Former    Current  packs/day: 0.00    Average packs/day: 1.5 packs/day for 20.0 years (30.0 ttl pk-yrs)    Types: Cigarettes    Start date: 03/02/1975    Quit date: 03/02/1995    Years since quitting: 29.2    Passive exposure: Past   Smokeless tobacco: Never  Vaping Use   Vaping status: Never Used  Substance Use Topics   Alcohol use: Yes    Comment: very rare   Drug use: Never   Social History   Social History Narrative   Not on file     Immunization History  Administered Date(s) Administered   PPD Test 12/09/2011     Objective: Vital Signs: LMP 08/13/2014    Physical Exam   Musculoskeletal Exam: ***  CDAI Exam: CDAI Score: -- Patient Global: --; Provider Global: -- Swollen: --; Tender: -- Joint Exam 06/07/2024   No joint exam has been documented for this visit   There is currently no information documented on the homunculus. Go to the Rheumatology activity and complete the homunculus joint exam.  Investigation: No additional findings.  Imaging: No results found.  Recent Labs: Lab Results  Component Value Date   WBC 3.9 12/01/2023   HGB 13.5 12/01/2023   PLT 197 12/01/2023   NA 140 12/01/2023   K 4.3 12/01/2023   CL 104 12/01/2023   CO2 27 12/01/2023   GLUCOSE 118 (H) 12/01/2023   BUN 19 12/01/2023   CREATININE 0.57 12/01/2023   BILITOT 0.4 12/01/2023   ALKPHOS 78 01/17/2021   AST 16 12/01/2023   ALT 16 12/01/2023   PROT 7.6 12/01/2023   PROT 7.6 12/01/2023   ALBUMIN 4.2 01/17/2021   CALCIUM 9.5 12/01/2023   GFRAA 115 12/10/2019   QFTBGOLDPLUS NEGATIVE 04/18/2020    Speciality Comments: No specialty comments available.  Procedures:  No procedures performed Allergies: Patient has no known allergies.   Assessment / Plan:     Visit Diagnoses: No diagnosis found.  Orders: No orders of the defined types were placed in this encounter.  No orders of the defined types were placed in this encounter.   Face-to-face time spent with patient was *** minutes.  Greater than 50% of time was spent in counseling and coordination of care.  Follow-Up Instructions: No follow-ups on file.   Daved JAYSON Gavel, CMA  Note - This record has been created using Animal nutritionist.  Chart creation errors have been sought, but may not always  have been located. Such creation errors do not reflect on  the standard of medical care.

## 2024-06-07 ENCOUNTER — Ambulatory Visit: Admitting: Rheumatology

## 2024-06-07 DIAGNOSIS — E782 Mixed hyperlipidemia: Secondary | ICD-10-CM

## 2024-06-07 DIAGNOSIS — M3509 Sicca syndrome with other organ involvement: Secondary | ICD-10-CM

## 2024-06-07 DIAGNOSIS — G4739 Other sleep apnea: Secondary | ICD-10-CM

## 2024-06-07 DIAGNOSIS — Z9049 Acquired absence of other specified parts of digestive tract: Secondary | ICD-10-CM

## 2024-06-07 DIAGNOSIS — Z87891 Personal history of nicotine dependence: Secondary | ICD-10-CM

## 2024-06-07 DIAGNOSIS — Z8639 Personal history of other endocrine, nutritional and metabolic disease: Secondary | ICD-10-CM

## 2024-06-07 DIAGNOSIS — L301 Dyshidrosis [pompholyx]: Secondary | ICD-10-CM

## 2024-06-07 DIAGNOSIS — G8929 Other chronic pain: Secondary | ICD-10-CM

## 2024-06-07 DIAGNOSIS — Z818 Family history of other mental and behavioral disorders: Secondary | ICD-10-CM

## 2024-06-07 DIAGNOSIS — J842 Lymphoid interstitial pneumonia: Secondary | ICD-10-CM

## 2024-06-07 DIAGNOSIS — L219 Seborrheic dermatitis, unspecified: Secondary | ICD-10-CM

## 2024-06-07 DIAGNOSIS — R7303 Prediabetes: Secondary | ICD-10-CM

## 2024-06-07 DIAGNOSIS — I10 Essential (primary) hypertension: Secondary | ICD-10-CM

## 2024-06-07 DIAGNOSIS — Z811 Family history of alcohol abuse and dependence: Secondary | ICD-10-CM

## 2024-06-07 DIAGNOSIS — Z87442 Personal history of urinary calculi: Secondary | ICD-10-CM

## 2024-06-07 DIAGNOSIS — Z8719 Personal history of other diseases of the digestive system: Secondary | ICD-10-CM

## 2024-06-07 DIAGNOSIS — E559 Vitamin D deficiency, unspecified: Secondary | ICD-10-CM

## 2024-07-19 NOTE — Progress Notes (Unsigned)
 "  Office Visit Note  Patient: Tiffany Glover             Date of Birth: 1961/01/10           MRN: [id number]             PCP: Dayna Motto, DO Referring: Dayna Motto, DO Visit Date: 08/02/2024 Occupation: Data Unavailable  Subjective:  No chief complaint on file.   History of Present Illness: Tiffany Glover is a 64 y.o. female ***     Activities of Daily Living:  Patient reports morning stiffness for *** {minute/hour:19697}.   Patient {ACTIONS;DENIES/REPORTS:21021675::Denies} nocturnal pain.  Difficulty dressing/grooming: {ACTIONS;DENIES/REPORTS:21021675::Denies} Difficulty climbing stairs: {ACTIONS;DENIES/REPORTS:21021675::Denies} Difficulty getting out of chair: {ACTIONS;DENIES/REPORTS:21021675::Denies} Difficulty using hands for taps, buttons, cutlery, and/or writing: {ACTIONS;DENIES/REPORTS:21021675::Denies}  No Rheumatology ROS completed.   PMFS History:  Patient Active Problem List   Diagnosis Date Noted   Sjogren syndrome, unspecified 08/15/2022   Physical deconditioning 09/11/2019   Abnormal finding on radiology exam- possible emphasema findings on CT abd/pelvis by urology 09/11/2019   Kidney stone 09/08/2019   Asymptomatic microscopic hematuria 06/03/2018   History of nephrolithiasis- not requiring surgery 06/03/2018   Prediabetes 06/03/2018   Chronic right-sided thoracic back pain-muscle spasms medial to right scapula and inferior to right scapula 03/18/2018   Night muscle spasms-bilateral calves and hamstrings. 03/18/2018   Glucose intolerance (impaired glucose tolerance) 03/18/2018   Urine discoloration 03/18/2018   Dehydration, mild 03/18/2018   Chronic bilateral thoracic back pain 09/26/2017   Myalgia 09/26/2017   Plantar fascial fibromatosis of right foot 11/10/2016   Muscle cramps in legs b/l 11/10/2016   Salt craving 06/11/2016   New Onset- Essential hypertension 06/01/2016   Bilateral impacted cerumen 06/01/2016   Person with feared  health complaint in whom no diagnosis is made 06/01/2016   Seborrheic dermatitis- ears, scalp 06/01/2016   Counseling on health promotion and disease prevention 06/01/2016   Vitamin D  insufficiency 03/26/2016   History of laparoscopic cholecystectomy 02/25/2016   History of smoking 30 pack years- Quit 9/ 1/ 96 02/25/2016   Family history of suicide- father age 45 02/25/2016   Family history of alcoholism in father; grandfathers etc 02/25/2016   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   Personal history of noncompliance with medical treatment and regimen 02/25/2016   Elevated blood pressure reading without diagnosis of hypertension 02/25/2016   Incisional hernia 09/15/2014   Incisional hernia, without obstruction or gangrene 08/08/2014   Allergic rhinitis/ seasonal allergies 05/17/2014   DUB (dysfunctional uterine bleeding) 05/17/2014   HLD (hyperlipidemia) 05/17/2014   Elevated fasting blood sugar 05/17/2014   Class 2 obesity in adult 05/17/2014   IFG (impaired fasting glucose) 05/17/2014   Menopausal symptoms 05/17/2014   Shoulder joint pain 05/17/2014   Morbid obesity (HCC) 09/14/2013   h/o Hypothyroidism 09/04/2007   h/o Hemorrhoids, internal, with bleeding 09/04/2007   h/o Hemorrhoids, external 09/04/2007   History of diverticulosis 09/04/2007    Past Medical History:  Diagnosis Date   Complication of anesthesia    hard to wake up once   Diverticulosis of colon    Eczema    Frequency of urination    History of hypothyroidism    10-06-2019  per pt no medication needed in several yrs, lab work normal   History of kidney stones    Hypertension    10-06-2019 per pt her pcp stopped medication 12/ 2019 (note in epic)  due to pt lost weight and bp improved;  currently per pt she is monitoring  bp at home for pcp   Mixed hyperlipidemia    Pre-diabetes    followed by pcp, watching diet   Renal calculus, right    Sjogren syndrome, unspecified    Sleep apnea    Urgency of  urination    Varicose vein of leg    Vitamin D  deficiency     Family History  Problem Relation Age of Onset   Cancer Mother        Lung   Suicidality Father    Alcohol abuse Father    Cancer Brother        mouth   Cancer Maternal Grandmother    Breast cancer Maternal Grandmother    Alcohol abuse Maternal Grandfather    Hyperlipidemia Paternal Grandmother    Hypertension Paternal Grandmother    Alcohol abuse Paternal Grandfather    Urolithiasis Son    Colon cancer Neg Hx    Colon polyps Neg Hx    Esophageal cancer Neg Hx    Rectal cancer Neg Hx    Stomach cancer Neg Hx    Past Surgical History:  Procedure Laterality Date   CESAREAN SECTION  x2  last one 1994   CHOLECYSTECTOMY  07/21/2012   Procedure: LAPAROSCOPIC CHOLECYSTECTOMY WITH INTRAOPERATIVE CHOLANGIOGRAM;  Surgeon: Donnice Bury, MD;  Location: [facility];  Service: General;  Laterality: N/A;  attempted   CHOLECYSTECTOMY  07/21/2012   Procedure: CHOLECYSTECTOMY;  Surgeon: Donnice Bury, MD;  Location: [facility];  Service: General;  Laterality: N/A;   COLONOSCOPY  last one 03-21-2017   CYSTOSCOPY/URETEROSCOPY/HOLMIUM LASER/STENT PLACEMENT Right 10/13/2019   Procedure: CYSTOSCOPY RIGHT RETROGRADE PYELOGRAM URETEROSCOPY/HOLMIUM LASER/STENT PLACEMENT;  Surgeon: Carolee Sherwood JONETTA DOUGLAS, MD;  Location: [facility];  Service: Urology;  Laterality: Right;   EXTRACORPOREAL SHOCK WAVE LITHOTRIPSY Right 11/02/2018   Procedure: EXTRACORPOREAL SHOCK WAVE LITHOTRIPSY (ESWL);  Surgeon: Carolee Sherwood JONETTA DOUGLAS, MD;  Location: [facility];  Service: Urology;  Laterality: Right;   FOOT SURGERY  1990s   ganglion cyst removed from right foot   INCISIONAL HERNIA REPAIR N/A 09/15/2014   Procedure: LAPAROSCOPIC INCISIONAL HERNIA REPAIR WITH MESH;  Surgeon: Donnice Bury, MD;  Location: [facility];  Service: General;  Laterality: N/A;   vein strippiing  1990s   left leg   Social History[1] Social History   Social History Narrative   Not on  file     Immunization History  Administered Date(s) Administered   PPD Test 12/09/2011     Objective: Vital Signs: LMP 09/08/2014    Physical Exam   Musculoskeletal Exam: ***  CDAI Exam: CDAI Score: -- Patient Global: --; Provider Global: -- Swollen: --; Tender: -- Joint Exam 08/02/2024   No joint exam has been documented for this visit   There is currently no information documented on the homunculus. Go to the Rheumatology activity and complete the homunculus joint exam.  Investigation: No additional findings.  Imaging: No results found.  Recent Labs: Lab Results  Component Value Date   WBC 3.9 12/01/2023   HGB 13.5 12/01/2023   PLT 197 12/01/2023   NA 140 12/01/2023   K 4.3 12/01/2023   CL 104 12/01/2023   CO2 27 12/01/2023   GLUCOSE 118 (H) 12/01/2023   BUN 19 12/01/2023   CREATININE 0.57 12/01/2023   BILITOT 0.4 12/01/2023   ALKPHOS 78 01/17/2021   AST 16 12/01/2023   ALT 16 12/01/2023   PROT 7.6 12/01/2023   PROT 7.6 12/01/2023   ALBUMIN 4.2 01/17/2021   CALCIUM 9.5 12/01/2023  GFRAA 115 12/10/2019   QFTBGOLDPLUS NEGATIVE 04/18/2020    Speciality Comments: No specialty comments available.  Procedures:  No procedures performed Allergies: Patient has no known allergies.   Assessment / Plan:     Visit Diagnoses: No diagnosis found.  Orders: No orders of the defined types were placed in this encounter.  No orders of the defined types were placed in this encounter.   Face-to-face time spent with patient was *** minutes. Greater than 50% of time was spent in counseling and coordination of care.  Follow-Up Instructions: No follow-ups on file.   Daved JAYSON Gavel, CMA  Note - This record has been created using Animal nutritionist.  Chart creation errors have been sought, but may not always  have been located. Such creation errors do not reflect on  the standard of medical care.    [1]  Social History Tobacco Use   Smoking status: Former     Current packs/day: 0.00    Average packs/day: 1.5 packs/day for 20.0 years (30.0 ttl pk-yrs)    Types: Cigarettes    Start date: 03/02/1975    Quit date: 03/02/1995    Years since quitting: 29.4    Passive exposure: Past   Smokeless tobacco: Never  Vaping Use   Vaping status: Never Used  Substance Use Topics   Alcohol use: Yes    Comment: very rare   Drug use: Never   "

## 2024-08-02 ENCOUNTER — Ambulatory Visit: Admitting: Physician Assistant

## 2024-08-02 DIAGNOSIS — Z811 Family history of alcohol abuse and dependence: Secondary | ICD-10-CM

## 2024-08-02 DIAGNOSIS — I10 Essential (primary) hypertension: Secondary | ICD-10-CM

## 2024-08-02 DIAGNOSIS — J842 Lymphoid interstitial pneumonia: Secondary | ICD-10-CM

## 2024-08-02 DIAGNOSIS — Z87442 Personal history of urinary calculi: Secondary | ICD-10-CM

## 2024-08-02 DIAGNOSIS — L219 Seborrheic dermatitis, unspecified: Secondary | ICD-10-CM

## 2024-08-02 DIAGNOSIS — Z87891 Personal history of nicotine dependence: Secondary | ICD-10-CM

## 2024-08-02 DIAGNOSIS — Z9049 Acquired absence of other specified parts of digestive tract: Secondary | ICD-10-CM

## 2024-08-02 DIAGNOSIS — M3509 Sicca syndrome with other organ involvement: Secondary | ICD-10-CM

## 2024-08-02 DIAGNOSIS — Z818 Family history of other mental and behavioral disorders: Secondary | ICD-10-CM

## 2024-08-02 DIAGNOSIS — Z8639 Personal history of other endocrine, nutritional and metabolic disease: Secondary | ICD-10-CM

## 2024-08-02 DIAGNOSIS — E782 Mixed hyperlipidemia: Secondary | ICD-10-CM

## 2024-08-02 DIAGNOSIS — R7303 Prediabetes: Secondary | ICD-10-CM

## 2024-08-02 DIAGNOSIS — G4739 Other sleep apnea: Secondary | ICD-10-CM

## 2024-08-02 DIAGNOSIS — Z8719 Personal history of other diseases of the digestive system: Secondary | ICD-10-CM

## 2024-08-02 DIAGNOSIS — G8929 Other chronic pain: Secondary | ICD-10-CM

## 2024-08-02 DIAGNOSIS — L301 Dyshidrosis [pompholyx]: Secondary | ICD-10-CM

## 2024-08-02 DIAGNOSIS — E559 Vitamin D deficiency, unspecified: Secondary | ICD-10-CM

## 2024-09-20 ENCOUNTER — Ambulatory Visit (INDEPENDENT_AMBULATORY_CARE_PROVIDER_SITE_OTHER)

## 2024-10-11 ENCOUNTER — Ambulatory Visit: Admitting: Physician Assistant
